# Patient Record
Sex: Male | Born: 1935 | Race: White | Hispanic: No | Marital: Married | Smoking: Former smoker
Health system: Southern US, Community
[De-identification: ages and names within clinical notes are randomized; demographics above are authoritative.]

## PROBLEM LIST (undated history)

## (undated) DIAGNOSIS — E785 Hyperlipidemia, unspecified: Secondary | ICD-10-CM

## (undated) DIAGNOSIS — K219 Gastro-esophageal reflux disease without esophagitis: Secondary | ICD-10-CM

## (undated) DIAGNOSIS — I639 Cerebral infarction, unspecified: Secondary | ICD-10-CM

## (undated) DIAGNOSIS — C9210 Chronic myeloid leukemia, BCR/ABL-positive, not having achieved remission: Secondary | ICD-10-CM

## (undated) DIAGNOSIS — M1990 Unspecified osteoarthritis, unspecified site: Secondary | ICD-10-CM

## (undated) HISTORY — DX: Hyperlipidemia, unspecified: E78.5

## (undated) HISTORY — PX: OTHER SURGICAL HISTORY: SHX169

## (undated) HISTORY — DX: Unspecified osteoarthritis, unspecified site: M19.90

## (undated) HISTORY — PX: CAROTID ENDARTERECTOMY: SUR193

## (undated) HISTORY — DX: Cerebral infarction, unspecified: I63.9

## (undated) HISTORY — PX: REPLACEMENT TOTAL KNEE BILATERAL: SUR1225

## (undated) HISTORY — DX: Gastro-esophageal reflux disease without esophagitis: K21.9

---

## 2004-06-20 ENCOUNTER — Other Ambulatory Visit: Payer: Self-pay

## 2004-07-02 ENCOUNTER — Inpatient Hospital Stay: Payer: Self-pay | Admitting: General Practice

## 2005-12-23 ENCOUNTER — Ambulatory Visit: Payer: Self-pay

## 2006-01-08 ENCOUNTER — Ambulatory Visit: Payer: Self-pay | Admitting: Gastroenterology

## 2007-04-21 ENCOUNTER — Ambulatory Visit: Payer: Self-pay | Admitting: Physician Assistant

## 2007-05-25 ENCOUNTER — Ambulatory Visit: Payer: Self-pay | Admitting: Urology

## 2007-10-27 ENCOUNTER — Ambulatory Visit: Payer: Self-pay | Admitting: Internal Medicine

## 2007-11-01 ENCOUNTER — Inpatient Hospital Stay: Payer: Self-pay | Admitting: Internal Medicine

## 2007-11-01 ENCOUNTER — Other Ambulatory Visit: Payer: Self-pay

## 2008-07-24 ENCOUNTER — Emergency Department: Payer: Self-pay | Admitting: Emergency Medicine

## 2008-07-25 ENCOUNTER — Inpatient Hospital Stay: Payer: Self-pay | Admitting: Internal Medicine

## 2008-11-03 ENCOUNTER — Emergency Department: Payer: Self-pay | Admitting: Emergency Medicine

## 2008-11-03 ENCOUNTER — Ambulatory Visit: Payer: Self-pay | Admitting: Internal Medicine

## 2008-11-16 ENCOUNTER — Emergency Department: Payer: Self-pay | Admitting: Emergency Medicine

## 2008-11-25 ENCOUNTER — Ambulatory Visit: Payer: Self-pay | Admitting: Internal Medicine

## 2008-12-04 ENCOUNTER — Ambulatory Visit: Payer: Self-pay | Admitting: Internal Medicine

## 2008-12-05 ENCOUNTER — Ambulatory Visit: Payer: Self-pay | Admitting: Internal Medicine

## 2009-01-04 ENCOUNTER — Ambulatory Visit: Payer: Self-pay | Admitting: Internal Medicine

## 2009-02-09 LAB — PULMONARY FUNCTION TEST

## 2009-03-06 ENCOUNTER — Ambulatory Visit: Payer: Self-pay | Admitting: Internal Medicine

## 2009-03-08 ENCOUNTER — Ambulatory Visit: Payer: Self-pay | Admitting: Internal Medicine

## 2009-04-05 ENCOUNTER — Ambulatory Visit: Payer: Self-pay | Admitting: Internal Medicine

## 2009-06-23 LAB — PULMONARY FUNCTION TEST

## 2009-07-11 ENCOUNTER — Ambulatory Visit: Payer: Self-pay | Admitting: Internal Medicine

## 2009-07-12 ENCOUNTER — Ambulatory Visit: Payer: Self-pay | Admitting: Internal Medicine

## 2009-08-04 ENCOUNTER — Ambulatory Visit: Payer: Self-pay | Admitting: Internal Medicine

## 2010-03-16 ENCOUNTER — Inpatient Hospital Stay: Payer: Self-pay | Admitting: Internal Medicine

## 2010-03-26 ENCOUNTER — Ambulatory Visit: Payer: Self-pay | Admitting: Internal Medicine

## 2010-04-05 ENCOUNTER — Ambulatory Visit: Payer: Self-pay | Admitting: Internal Medicine

## 2010-04-07 ENCOUNTER — Emergency Department: Payer: Self-pay | Admitting: Emergency Medicine

## 2010-04-08 ENCOUNTER — Ambulatory Visit: Payer: Self-pay | Admitting: Internal Medicine

## 2010-07-25 ENCOUNTER — Ambulatory Visit: Payer: Self-pay | Admitting: Internal Medicine

## 2010-08-05 ENCOUNTER — Ambulatory Visit: Payer: Self-pay | Admitting: Internal Medicine

## 2010-12-11 ENCOUNTER — Ambulatory Visit: Payer: Self-pay | Admitting: Internal Medicine

## 2011-01-05 ENCOUNTER — Ambulatory Visit: Payer: Self-pay | Admitting: Internal Medicine

## 2011-02-04 ENCOUNTER — Ambulatory Visit: Payer: Self-pay | Admitting: Internal Medicine

## 2011-03-27 ENCOUNTER — Ambulatory Visit: Payer: Self-pay | Admitting: Internal Medicine

## 2011-04-06 ENCOUNTER — Ambulatory Visit: Payer: Self-pay | Admitting: Internal Medicine

## 2012-03-27 ENCOUNTER — Ambulatory Visit: Payer: Self-pay | Admitting: Internal Medicine

## 2012-05-26 ENCOUNTER — Ambulatory Visit: Payer: Self-pay | Admitting: Internal Medicine

## 2013-05-19 ENCOUNTER — Ambulatory Visit: Payer: Self-pay | Admitting: Internal Medicine

## 2013-06-01 ENCOUNTER — Ambulatory Visit: Payer: Self-pay | Admitting: Internal Medicine

## 2013-06-01 LAB — CBC CANCER CENTER
BANDS NEUTROPHIL: 2 %
COMMENT - H1-COM1: NORMAL
EOS PCT: 2 %
HCT: 32.1 % — AB (ref 40.0–52.0)
HGB: 10.4 g/dL — AB (ref 13.0–18.0)
LYMPHS PCT: 12 %
MCH: 31.4 pg (ref 26.0–34.0)
MCHC: 32.3 g/dL (ref 32.0–36.0)
MCV: 97 fL (ref 80–100)
Metamyelocyte: 2 %
Monocytes: 32 %
Myelocyte: 1 %
Platelet: 161 x10 3/mm (ref 150–440)
RBC: 3.31 10*6/uL — ABNORMAL LOW (ref 4.40–5.90)
RDW: 13.3 % (ref 11.5–14.5)
Segmented Neutrophils: 47 %
VARIANT LYMPHOCYTE - H4-RLYMPH: 2 %
WBC: 12.6 x10 3/mm — ABNORMAL HIGH (ref 3.8–10.6)

## 2013-06-01 LAB — FOLATE: FOLIC ACID: 40.3 ng/mL (ref 3.1–100.0)

## 2013-06-01 LAB — LACTATE DEHYDROGENASE: LDH: 190 U/L (ref 85–241)

## 2013-06-01 LAB — RETICULOCYTES
Absolute Retic Count: 0.0675 10*6/uL (ref 0.019–0.186)
RETICULOCYTE: 2.04 % (ref 0.4–3.1)

## 2013-06-01 LAB — FERRITIN: FERRITIN (ARMC): 78 ng/mL (ref 8–388)

## 2013-06-02 LAB — IRON AND TIBC
IRON SATURATION: 21 %
IRON: 61 ug/dL — AB (ref 65–175)
Iron Bind.Cap.(Total): 286 ug/dL (ref 250–450)
UNBOUND IRON-BIND. CAP.: 225 ug/dL

## 2013-06-02 LAB — PROT IMMUNOELECTROPHORES(ARMC)

## 2013-06-06 ENCOUNTER — Ambulatory Visit: Payer: Self-pay | Admitting: Internal Medicine

## 2013-11-07 ENCOUNTER — Other Ambulatory Visit: Payer: Self-pay | Admitting: Ophthalmology

## 2013-11-07 LAB — SEDIMENTATION RATE: Erythrocyte Sed Rate: 43 mm/hr — ABNORMAL HIGH (ref 0–20)

## 2013-11-12 ENCOUNTER — Ambulatory Visit: Payer: Self-pay | Admitting: Surgery

## 2013-11-12 LAB — CBC WITH DIFFERENTIAL/PLATELET
BASOS PCT: 0.3 %
Basophil #: 0.1 10*3/uL (ref 0.0–0.1)
Eosinophil #: 0 10*3/uL (ref 0.0–0.7)
Eosinophil %: 0 %
HCT: 30.2 % — ABNORMAL LOW (ref 40.0–52.0)
HGB: 10 g/dL — ABNORMAL LOW (ref 13.0–18.0)
LYMPHS PCT: 8.7 %
Lymphocyte #: 2 10*3/uL (ref 1.0–3.6)
MCH: 32.8 pg (ref 26.0–34.0)
MCHC: 33.1 g/dL (ref 32.0–36.0)
MCV: 99 fL (ref 80–100)
MONO ABS: 8 x10 3/mm — AB (ref 0.2–1.0)
MONOS PCT: 34.8 %
NEUTROS PCT: 56.2 %
Neutrophil #: 12.9 10*3/uL — ABNORMAL HIGH (ref 1.4–6.5)
PLATELETS: 147 10*3/uL — AB (ref 150–440)
RBC: 3.05 10*6/uL — AB (ref 4.40–5.90)
RDW: 13.8 % (ref 11.5–14.5)
WBC: 23 10*3/uL — ABNORMAL HIGH (ref 3.8–10.6)

## 2013-11-12 LAB — BASIC METABOLIC PANEL
ANION GAP: 6 — AB (ref 7–16)
BUN: 45 mg/dL — AB (ref 7–18)
Calcium, Total: 8.7 mg/dL (ref 8.5–10.1)
Chloride: 107 mmol/L (ref 98–107)
Co2: 28 mmol/L (ref 21–32)
Creatinine: 2.09 mg/dL — ABNORMAL HIGH (ref 0.60–1.30)
GFR CALC AF AMER: 31 — AB
GFR CALC NON AF AMER: 27 — AB
Glucose: 98 mg/dL (ref 65–99)
Osmolality: 293 (ref 275–301)
Potassium: 4.2 mmol/L (ref 3.5–5.1)
Sodium: 141 mmol/L (ref 136–145)

## 2013-11-15 LAB — PATHOLOGY REPORT

## 2013-11-29 ENCOUNTER — Ambulatory Visit: Payer: Self-pay | Admitting: Internal Medicine

## 2013-12-23 ENCOUNTER — Ambulatory Visit: Payer: Self-pay | Admitting: Otolaryngology

## 2014-04-08 ENCOUNTER — Ambulatory Visit: Payer: Self-pay | Admitting: Otolaryngology

## 2014-05-11 ENCOUNTER — Ambulatory Visit (INDEPENDENT_AMBULATORY_CARE_PROVIDER_SITE_OTHER): Payer: Medicare Other | Admitting: Pulmonary Disease

## 2014-05-11 ENCOUNTER — Encounter: Payer: Self-pay | Admitting: Pulmonary Disease

## 2014-05-11 VITALS — BP 134/72 | HR 101 | Ht 67.0 in | Wt 189.0 lb

## 2014-05-11 DIAGNOSIS — R059 Cough, unspecified: Secondary | ICD-10-CM

## 2014-05-11 DIAGNOSIS — M316 Other giant cell arteritis: Secondary | ICD-10-CM | POA: Insufficient documentation

## 2014-05-11 DIAGNOSIS — R05 Cough: Secondary | ICD-10-CM

## 2014-05-11 DIAGNOSIS — R0602 Shortness of breath: Secondary | ICD-10-CM

## 2014-05-11 NOTE — Assessment & Plan Note (Signed)
This appears to be primarily related to his sinus congestion. However, considering the possibility of COPD as described above we will start Symbicort to see if this helps.

## 2014-05-11 NOTE — Patient Instructions (Signed)
We will request records from Dr. Laurelyn Sickle office for the pulmonary function test and will request the echocardiogram results  Take the Symbicort 2 puffs twice a day with the spacer no matter how you feel  We will see you back in 4 weeks or sooner if needed

## 2014-05-11 NOTE — Assessment & Plan Note (Signed)
Continue prednisone as prescribed by Dr. Jefm Bryant.

## 2014-05-11 NOTE — Progress Notes (Signed)
Subjective:    Patient ID: Lucas Newman, male    DOB: Sep 10, 1919, 81 y.o.   MRN: [id number]  HPI  Mr. Mathews was sent to see me by Dr. Pryor Ochoa for evaluation of several months of a cough.  He says that he brings up phlegm which is white in color and he has hoarseness.  He says that this has been a persistent problem for years, but it has been much worse in the last several months.  He initially attributed it his sinus congestion.  However lately the cough has been worse in the evenings when he lies flat.  Dr. Pryor Ochoa has performed a laryngoscopy which he says was normal.    He has never been told he had a lung problem, he had a normal childhood without asthma.  He started smoking cigarettes when he was 88 and quit at age 63.  Later he smoked for about 3-4 years and quit again about 30 years ago.  He smoked 2 packs per day.  He says that all of his siblings died as adults with smoking related problems.  He has not had repeated episodes of bronchitis.  He does not recall if he has had pulmonary function testing.   He has been having more trouble breathing which he attributes to the congestion from his sinuses.  He thinks that the swelling and muscle weakness he has experienced as a side effect of the prednisone has been contributing to his shortness of breath.    He worked as a Freight forwarder in [employer].    He has been taking prednisone recently because he was told that he had a an autoimmune condition causing problems.  It sounds like they worry that he has temporal arteritis.  He has been taking prednisone now for about 5-6 months he thinks.  He has been followed by Dr. Cristi Loron for this. He goes for blood tests with Dr. Jefm Bryant for this.    Past Medical History  Diagnosis Date  . Hyperlipidemia   . GERD (gastroesophageal reflux disease)   . Arthritis   . CVA (cerebral infarction)      Family History  Problem Relation Age of Onset  . Lung cancer Brother       History   Social History  . Marital Status: Married    Spouse Name: N/A    Number of Children: N/A  . Years of Education: N/A   Occupational History  . Not on file.   Social History Main Topics  . Smoking status: Former Smoker -- 2.00 packs/day for 20 years    Types: Cigarettes    Quit date: 05/11/1984  . Smokeless tobacco: Never Used  . Alcohol Use: 1.8 oz/week    3 Not specified per week     Comment: occasional beer/bloody mary  . Drug Use: No  . Sexual Activity: Not on file   Other Topics Concern  . Not on file   Social History Narrative  . No narrative on file     No Known Allergies   No outpatient prescriptions prior to visit.   No facility-administered medications prior to visit.      Review of Systems  Constitutional: Positive for fatigue. Negative for fever and unexpected weight change.  HENT: Positive for congestion and postnasal drip. Negative for dental problem, ear pain, nosebleeds, rhinorrhea, sinus pressure, sneezing, sore throat and trouble swallowing.   Eyes: Negative for redness and itching.  Respiratory: Positive for cough and shortness of breath. Negative  for chest tightness and wheezing.   Cardiovascular: Negative for palpitations and leg swelling.  Gastrointestinal: Negative for nausea and vomiting.  Genitourinary: Negative for dysuria.  Musculoskeletal: Negative for joint swelling.  Skin: Negative for rash.  Neurological: Negative for headaches.  Hematological: Does not bruise/bleed easily.  Psychiatric/Behavioral: Negative for dysphoric mood. The patient is not nervous/anxious.        Objective:   Physical Exam Filed Vitals:   05/11/14 1034  BP: 134/72  Pulse: 101  Height: 5\' 7"  (1.702 m)  Weight: 189 lb (85.73 kg)  SpO2: 96%   Gen: elderly male, chronically ill appearing, no acute distress HEENT: NCAT, PERRL, EOMi, OP clear, neck supple without masses PULM: Crackles in bases CV: RRR, systolic heart murmur, no JVD AB: BS+,  soft, nontender, no hsm Ext: warm, pitting leg edema, no clubbing, no cyanosis Derm: bruising forearms, no skin breakdown Neuro: A&Ox4, CN II-XII intact, strength 5/5 in all 4 extremities   December 2015 chest x-ray images reviewed, no evidence of lung disease, tortuous aorta     Assessment & Plan:   Shortness of breath I explained to Mr. Vice today in clinic that anytime someone who has a lengthy smoking history comes to see me with a complaint of shortness of breath and cough that I think of COPD. However, considering his advanced age and the fact that he never really had much in the way of shortness of breath over the years makes me question this as a possibility.  Further, I have reviewed the images from his recent chest x-ray and I do not see significant evidence of lung disease.  Considering the recent diagnosis of temporal arteritis I do question whether or not there could be an autoimmune process going on here, though common things being common I suppose COPD is at the highest of the differential diagnosis.  He has some leg swelling as well as a heart murmur on exam today so I would like to see the results of a recent echocardiogram. If one has not been done recently than we will need to obtain one.  Plan:  -trial of Symbicort 2 puffs twice a day with a spacer -We will obtain records of his recent pulmonary function test as well as the most recent echocardiogram. -If the pulmonary function test showed evidence of restrictive lung disease then we may want to consider a CT scan of his chest. -Follow-up one month  Cough This appears to be primarily related to his sinus congestion. However, considering the possibility of COPD as described above we will start Symbicort to see if this helps.  Temporal arteritis Continue prednisone as prescribed by Dr. Jefm Bryant.    Updated Medication List Outpatient Encounter Prescriptions as of 05/11/2014  Medication Sig  . acetaminophen  (TYLENOL) 650 MG CR tablet Take 650 mg by mouth every 8 (eight) hours as needed for pain.  Marland Kitchen CALCIUM-MAGNESIUM-ZINC PO Take 1 tablet by mouth daily.  . calcium-vitamin D (OSCAL) 250-125 MG-UNIT per tablet Take 1 tablet by mouth daily.  Marland Kitchen dipyridamole-aspirin (AGGRENOX) 200-25 MG per 12 hr capsule Take 2 capsules by mouth daily.  Mariane Baumgarten Calcium (STOOL SOFTENER PO) Take 1 tablet by mouth daily.  . finasteride (PROSCAR) 5 MG tablet Take 5 mg by mouth daily.  . flunisolide (NASALIDE) 25 MCG/ACT (0.025%) SOLN Place 2 sprays into the nose daily.  . Magnesium 250 MG TABS Take 1 tablet by mouth daily.  . Multiple Vitamin (MULTIVITAMIN WITH MINERALS) TABS tablet Take 1 tablet by mouth  daily.  . omeprazole (PRILOSEC) 20 MG capsule Take 20 mg by mouth daily.  . predniSONE (DELTASONE) 20 MG tablet Take 30 mg by mouth daily with breakfast.  . simvastatin (ZOCOR) 20 MG tablet Take 20 mg by mouth daily.  . tamsulosin (FLOMAX) 0.4 MG CAPS capsule Take 0.4 mg by mouth daily.

## 2014-05-11 NOTE — Assessment & Plan Note (Addendum)
I explained to Lucas Newman today in clinic that anytime someone who has a lengthy smoking history comes to see me with a complaint of shortness of breath and cough that I think of COPD. However, considering his advanced age and the fact that he never really had much in the way of shortness of breath over the years makes me question this as a possibility.  Further, I have reviewed the images from his recent chest x-ray and I do not see significant evidence of lung disease.  Considering the recent diagnosis of temporal arteritis I do question whether or not there could be an autoimmune process going on here, though common things being common I suppose COPD is at the highest of the differential diagnosis.  He has some leg swelling as well as a heart murmur on exam today so I would like to see the results of a recent echocardiogram. If one has not been done recently than we will need to obtain one.  Plan:  -trial of Symbicort 2 puffs twice a day with a spacer -We will obtain records of his recent pulmonary function test as well as the most recent echocardiogram. -If the pulmonary function test showed evidence of restrictive lung disease then we may want to consider a CT scan of his chest. -Follow-up one month

## 2014-05-26 ENCOUNTER — Telehealth: Payer: Self-pay

## 2014-05-26 NOTE — Telephone Encounter (Signed)
Pt scheduled for pft at 11 on 2/3 before appt with BQ at 12:00 that day.  Nothing further needed.

## 2014-05-26 NOTE — Telephone Encounter (Signed)
-----   Message from Juanito Doom, MD sent at 05/25/2014 10:22 PM EST ----- A, Please tell her that her PFTs from her PCP were not acceptable by ATS criteria, she needs a new order for PFT : reason dyspnea  Thanks B

## 2014-06-08 ENCOUNTER — Other Ambulatory Visit: Payer: Self-pay | Admitting: Pulmonary Disease

## 2014-06-08 ENCOUNTER — Ambulatory Visit (INDEPENDENT_AMBULATORY_CARE_PROVIDER_SITE_OTHER): Payer: Medicare Other | Admitting: Pulmonary Disease

## 2014-06-08 ENCOUNTER — Encounter: Payer: Self-pay | Admitting: Pulmonary Disease

## 2014-06-08 VITALS — BP 124/76 | HR 107 | Ht 67.0 in | Wt 190.0 lb

## 2014-06-08 DIAGNOSIS — R0602 Shortness of breath: Secondary | ICD-10-CM

## 2014-06-08 DIAGNOSIS — R05 Cough: Secondary | ICD-10-CM

## 2014-06-08 DIAGNOSIS — R059 Cough, unspecified: Secondary | ICD-10-CM

## 2014-06-08 NOTE — Progress Notes (Signed)
Subjective:    Patient ID: Lucas Newman, male    DOB: [birth year], 81 y.o.   MRN: [id number]  Synopsis: Referred in 2016 for evaluation of shortness of breath. He had a distant heavy smoking history but lung exam, simple spirometry, chest x-ray, and oximetry were normal. Of note, upon the time of his referral in 2016 he was being treated for temporal arteritis with prednisone.  HPI Chief Complaint  Patient presents with  . Follow-up    review pft.  Pt c/o sob with exertion, improving prod cough worse at night, pnd.    Mr. Lucas Newman returns to clinic today for further evaluation of his shortness of breath. He says that this has not changed since his last visit. He tried taking the Symbicort twice a day but he says that it did not help. He has not had much problems with cough since the last visit. He says his biggest problem is that he just feels generally weak. He continues to take prednisone daily though he is not certain when this medication will stop. He is currently taking 12.5 mg daily.  Past Medical History  Diagnosis Date  . Hyperlipidemia   . GERD (gastroesophageal reflux disease)   . Arthritis   . CVA (cerebral infarction)       Review of Systems     Objective:   Physical Exam Filed Vitals:   06/08/14 1150  BP: 124/76  Pulse: 107  Height: 5\' 7"  (1.702 m)  Weight: 190 lb (86.183 kg)  SpO2: 100%   Gen chronically ill apperaing HEENT: NCAT EOMi PULM: CTA B CV: RRR, no mgr AB: BS+, soft, nontender Ext: warm, trace ankle edema Neuro: A&Ox4, maew  February 2016 simple spirometry with bronchodilator: Ratio 83%, FEV1 1.73 L (88% predicted, no change with bronchodilator), FVC 2.09 L (70% predicted)       Assessment & Plan:   Shortness of breath Today Mr. Lucas Newman Stage I was unable to perform full pulmonary function testing but we did obtain an adequate spirometry test which surprisingly showed no evidence of COPD. The decreased FVC was suggestive of but not  diagnostic of restrictive lung disease. In his case I would suspect that his body habitus contributes to this. Considering his normal lung exam, normal chest x-ray, and normal oximetry I think the likelihood of an interstitial lung disease is low.  I explained to him that I believe his dyspnea is related to general fatigue and muscle weakness related to his ongoing steroid use.   Plan: If he does not have improvement in dyspnea after stopping the steroids then I asked him to come back and see [place] and we can perform further testing such as a CT scan or reattempt full pulmonary function testing.    Updated Medication List Outpatient Encounter Prescriptions as of 06/08/2014  Medication Sig  . acetaminophen (TYLENOL) 650 MG CR tablet Take 650 mg by mouth every 8 (eight) hours as needed for pain.  . budesonide-formoterol (SYMBICORT) 160-4.5 MCG/ACT inhaler Inhale 2 puffs into the lungs 2 (two) times daily.  Marland Kitchen CALCIUM-MAGNESIUM-ZINC PO Take 1 tablet by mouth daily.  . calcium-vitamin D (OSCAL) 250-125 MG-UNIT per tablet Take 1 tablet by mouth daily.  Marland Kitchen dipyridamole-aspirin (AGGRENOX) 200-25 MG per 12 hr capsule Take 2 capsules by mouth daily.  Mariane Baumgarten Calcium (STOOL SOFTENER PO) Take 1 tablet by mouth daily.  . finasteride (PROSCAR) 5 MG tablet Take 5 mg by mouth daily.  . flunisolide (NASALIDE) 25 MCG/ACT (0.025%) SOLN Place 2 sprays  into the nose daily.  . Magnesium 250 MG TABS Take 1 tablet by mouth daily.  . Multiple Vitamin (MULTIVITAMIN WITH MINERALS) TABS tablet Take 1 tablet by mouth daily.  Marland Kitchen omeprazole (PRILOSEC) 20 MG capsule Take 20 mg by mouth daily.  . predniSONE (DELTASONE) 20 MG tablet Take 30 mg by mouth daily with breakfast.  . simvastatin (ZOCOR) 20 MG tablet Take 20 mg by mouth daily.  . tamsulosin (FLOMAX) 0.4 MG CAPS capsule Take 0.4 mg by mouth daily.

## 2014-06-08 NOTE — Patient Instructions (Signed)
Follow Dr. Scharlene Gloss instructions regarding your steroid If you still have shortness of breath after stopping the prednisone let us know

## 2014-06-08 NOTE — Progress Notes (Signed)
Pre & Post performed today per BQ.

## 2014-06-08 NOTE — Assessment & Plan Note (Signed)
Today Mr. Lucas Newman I was unable to perform full pulmonary function testing but we did obtain an adequate spirometry test which surprisingly showed no evidence of COPD. The decreased FVC was suggestive of but not diagnostic of restrictive lung disease. In his case I would suspect that his body habitus contributes to this. Considering his normal lung exam, normal chest x-ray, and normal oximetry I think the likelihood of an interstitial lung disease is low.  I explained to him that I believe his dyspnea is related to general fatigue and muscle weakness related to his ongoing steroid use.   Plan: If he does not have improvement in dyspnea after stopping the steroids then I asked him to come back and see [place] and we can perform further testing such as a CT scan or reattempt full pulmonary function testing.

## 2014-06-09 ENCOUNTER — Ambulatory Visit: Payer: PRIVATE HEALTH INSURANCE | Admitting: Pulmonary Disease

## 2014-06-17 LAB — PULMONARY FUNCTION TEST
FEF 25-75 Post: 1.89 L/sec
FEF 25-75 Pre: 2.31 L/sec
FEF2575-%Change-Post: -17 %
FEF2575-%Pred-Post: 173 %
FEF2575-%Pred-Pre: 211 %
FEV1-%Change-Post: -5 %
FEV1-%Pred-Post: 88 %
FEV1-%Pred-Pre: 93 %
FEV1-Post: 1.73 L
FEV1-Pre: 1.84 L
FEV1FVC-%Change-Post: -2 %
FEV1FVC-%Pred-Pre: 123 %
FEV6-%Change-Post: -2 %
FEV6-%Pred-Post: 77 %
FEV6-%Pred-Pre: 79 %
FEV6-Post: 2.09 L
FEV6-Pre: 2.16 L
FEV6FVC-%Pred-Post: 109 %
FEV6FVC-%Pred-Pre: 109 %
FVC-%Change-Post: -2 %
FVC-%Pred-Post: 70 %
FVC-%Pred-Pre: 72 %
FVC-Post: 2.09 L
FVC-Pre: 2.16 L
Post FEV1/FVC ratio: 83 %
Post FEV6/FVC ratio: 100 %
Pre FEV1/FVC ratio: 85 %
Pre FEV6/FVC Ratio: 100 %

## 2014-06-28 ENCOUNTER — Ambulatory Visit: Payer: Self-pay | Admitting: Internal Medicine

## 2014-07-05 ENCOUNTER — Ambulatory Visit
Admit: 2014-07-05 | Disposition: A | Discharge status: Home / Self Care | Payer: Self-pay | Attending: Internal Medicine | Admitting: Internal Medicine

## 2014-08-05 ENCOUNTER — Ambulatory Visit
Admit: 2014-08-05 | Disposition: A | Discharge status: Home / Self Care | Payer: Self-pay | Attending: Internal Medicine | Admitting: Internal Medicine

## 2014-08-27 NOTE — Op Note (Signed)
PATIENT NAME:  [name], [name] MR#:  [id number] DATE OF BIRTH:  May 03, 1920  DATE OF PROCEDURE:  11/12/2013  PREOPERATIVE DIAGNOSIS: Left thigh pain, concern for temporal arteritis.  POSTOPERATIVE DIAGNOSIS:  Left thigh pain, concern for temporal arteritis.  PROCEDURE PERFORMED:  Left temporal artery biopsy.   ANESTHESIA: MAC local.   ESTIMATED BLOOD LOSS: 5 mL.   COMPLICATIONS: None.  SPECIMEN: Temporal artery specimen.    INDICATION FOR PROCEDURE:  Mr. Lucas Newman is a 81 year old male who presents with acute onset of left eye pain.  He was seen by his ophthalmologist and their concern is for temporal arteritis.  He thus was brought to the operating room suite for left temporal artery biopsy.   DETAILS OF PROCEDURE: Informed consent was obtained. Mr. Riffe was brought to the operating room suite. He was given IV sedation. His left face, superior to the tragus of the ear,  was clipped and prepped with Betadine. I then used the Doppler to follow the course of his temporal artery. I then made an incision over the temporal artery and went down to the superficial tissues.  Isolated the temporal artery and found approximately 2 cm of nice tortuous temporal artery, and sharply ligated at the 2 ends that were tied.  All branches were ligated.  The specimen was then sent to pathology. The wound was then irrigated and closed in 2 layers, a deep dermal 0 Vicryl and a 4-0 Monocryl running subcuticular. Dermabond was then used to complete the dressing. The patient was then awoken and brought to the postanesthesia care unit. There were no immediate complications. Needle, sponge, and instrument counts were correct at the end of the procedure.   ____________________________ Glena Norfolk. Nitza Schmid, MD cal:ts D: 11/12/2013 10:54:03 ET T: 11/12/2013 11:31:21 ET JOB#: [id number]  cc: Harrell Gave A. Jackey Housey, MD, <Dictator> Floyde Parkins MD ELECTRONICALLY SIGNED 11/14/2013 18:15

## 2014-09-04 ENCOUNTER — Emergency Department: Payer: Medicare Other

## 2014-09-04 ENCOUNTER — Inpatient Hospital Stay
Admission: EM | Admit: 2014-09-04 | Discharge: 2014-09-07 | DRG: 871 | Disposition: A | Discharge status: Skilled Nursing Facility | Payer: Medicare Other | Attending: Internal Medicine | Admitting: Internal Medicine

## 2014-09-04 ENCOUNTER — Encounter: Payer: Self-pay | Admitting: Internal Medicine

## 2014-09-04 DIAGNOSIS — N179 Acute kidney failure, unspecified: Secondary | ICD-10-CM | POA: Diagnosis present

## 2014-09-04 DIAGNOSIS — N183 Chronic kidney disease, stage 3 (moderate): Secondary | ICD-10-CM | POA: Diagnosis present

## 2014-09-04 DIAGNOSIS — Z7952 Long term (current) use of systemic steroids: Secondary | ICD-10-CM

## 2014-09-04 DIAGNOSIS — Z833 Family history of diabetes mellitus: Secondary | ICD-10-CM

## 2014-09-04 DIAGNOSIS — M1990 Unspecified osteoarthritis, unspecified site: Secondary | ICD-10-CM | POA: Diagnosis present

## 2014-09-04 DIAGNOSIS — Z79899 Other long term (current) drug therapy: Secondary | ICD-10-CM | POA: Diagnosis not present

## 2014-09-04 DIAGNOSIS — Z87891 Personal history of nicotine dependence: Secondary | ICD-10-CM

## 2014-09-04 DIAGNOSIS — J441 Chronic obstructive pulmonary disease with (acute) exacerbation: Secondary | ICD-10-CM

## 2014-09-04 DIAGNOSIS — E785 Hyperlipidemia, unspecified: Secondary | ICD-10-CM | POA: Diagnosis present

## 2014-09-04 DIAGNOSIS — Z8249 Family history of ischemic heart disease and other diseases of the circulatory system: Secondary | ICD-10-CM

## 2014-09-04 DIAGNOSIS — J189 Pneumonia, unspecified organism: Secondary | ICD-10-CM | POA: Diagnosis present

## 2014-09-04 DIAGNOSIS — Z8673 Personal history of transient ischemic attack (TIA), and cerebral infarction without residual deficits: Secondary | ICD-10-CM | POA: Diagnosis not present

## 2014-09-04 DIAGNOSIS — K219 Gastro-esophageal reflux disease without esophagitis: Secondary | ICD-10-CM | POA: Diagnosis present

## 2014-09-04 DIAGNOSIS — A419 Sepsis, unspecified organism: Secondary | ICD-10-CM | POA: Diagnosis not present

## 2014-09-04 DIAGNOSIS — Z801 Family history of malignant neoplasm of trachea, bronchus and lung: Secondary | ICD-10-CM

## 2014-09-04 DIAGNOSIS — Z7951 Long term (current) use of inhaled steroids: Secondary | ICD-10-CM

## 2014-09-04 DIAGNOSIS — I776 Arteritis, unspecified: Secondary | ICD-10-CM | POA: Diagnosis present

## 2014-09-04 DIAGNOSIS — J181 Lobar pneumonia, unspecified organism: Secondary | ICD-10-CM

## 2014-09-04 LAB — COMPREHENSIVE METABOLIC PANEL
ALT: 12 U/L — AB (ref 17–63)
AST: 19 U/L (ref 15–41)
Albumin: 3.5 g/dL (ref 3.5–5.0)
Alkaline Phosphatase: 46 U/L (ref 38–126)
Anion gap: 9 (ref 5–15)
BUN: 31 mg/dL — ABNORMAL HIGH (ref 6–20)
CO2: 27 mmol/L (ref 22–32)
Calcium: 8.8 mg/dL — ABNORMAL LOW (ref 8.9–10.3)
Chloride: 102 mmol/L (ref 101–111)
Creatinine, Ser: 1.78 mg/dL — ABNORMAL HIGH (ref 0.61–1.24)
GFR calc Af Amer: 36 mL/min — ABNORMAL LOW (ref >60)
GFR, EST NON AFRICAN AMERICAN: 31 mL/min — AB (ref >60)
Glucose, Bld: 180 mg/dL — ABNORMAL HIGH (ref 65–99)
POTASSIUM: 4 mmol/L (ref 3.5–5.1)
SODIUM: 138 mmol/L (ref 135–145)
Total Bilirubin: 0.6 mg/dL (ref 0.3–1.2)
Total Protein: 7.3 g/dL (ref 6.5–8.1)

## 2014-09-04 LAB — CBC
HCT: 29.6 % — ABNORMAL LOW (ref 40.0–52.0)
Hemoglobin: 9.4 g/dL — ABNORMAL LOW (ref 13.0–18.0)
MCH: 31.5 pg (ref 26.0–34.0)
MCHC: 31.9 g/dL — ABNORMAL LOW (ref 32.0–36.0)
MCV: 98.9 fL (ref 80.0–100.0)
PLATELETS: 149 10*3/uL — AB (ref 150–440)
RBC: 2.99 MIL/uL — ABNORMAL LOW (ref 4.40–5.90)
RDW: 13.5 % (ref 11.5–14.5)
WBC: 31.3 10*3/uL — ABNORMAL HIGH (ref 3.8–10.6)

## 2014-09-04 LAB — BRAIN NATRIURETIC PEPTIDE: B NATRIURETIC PEPTIDE 5: 172 pg/mL — AB (ref 0.0–100.0)

## 2014-09-04 LAB — TROPONIN I: Troponin I: 0.02 ng/mL (ref <0.031)

## 2014-09-04 MED ORDER — DEXTROSE 5 % IV SOLN
INTRAVENOUS | Status: AC
  Filled 2014-09-04: qty 10

## 2014-09-04 MED ORDER — HYDROCODONE-ACETAMINOPHEN 5-325 MG PO TABS
1.0000 | ORAL_TABLET | ORAL | Status: DC | PRN
Start: 2014-09-04 — End: 2014-09-07

## 2014-09-04 MED ORDER — FINASTERIDE 5 MG PO TABS
5.0000 mg | ORAL_TABLET | Freq: Every day | ORAL | Status: DC
  Administered 2014-09-05 – 2014-09-07 (×3): 5 mg via ORAL
  Filled 2014-09-04 (×4): qty 1

## 2014-09-04 MED ORDER — ACETAMINOPHEN 325 MG PO TABS: 650.0000 mg | ORAL_TABLET | Freq: Four times a day (QID) | ORAL | Status: DC | PRN

## 2014-09-04 MED ORDER — SODIUM CHLORIDE 0.9 % IV SOLN
1000.0000 mL | Freq: Once | INTRAVENOUS | Status: AC
  Administered 2014-09-04: 1000 mL via INTRAVENOUS

## 2014-09-04 MED ORDER — ONDANSETRON HCL 4 MG PO TABS
4.0000 mg | ORAL_TABLET | Freq: Four times a day (QID) | ORAL | Status: DC | PRN
  Filled 2014-09-04: qty 1

## 2014-09-04 MED ORDER — LORATADINE 10 MG PO TABS
10.0000 mg | ORAL_TABLET | Freq: Every day | ORAL | Status: DC
  Administered 2014-09-05 – 2014-09-07 (×3): 10 mg via ORAL
  Filled 2014-09-04 (×3): qty 1

## 2014-09-04 MED ORDER — PREDNISONE 5 MG/5ML PO SOLN
7.5000 mg | Freq: Every day | ORAL | Status: DC
  Filled 2014-09-04 (×3): qty 7.5

## 2014-09-04 MED ORDER — ASPIRIN-DIPYRIDAMOLE ER 25-200 MG PO CP12
1.0000 | ORAL_CAPSULE | Freq: Two times a day (BID) | ORAL | Status: DC
  Administered 2014-09-04 – 2014-09-07 (×6): 1 via ORAL
  Filled 2014-09-04 (×7): qty 1

## 2014-09-04 MED ORDER — GUAIFENESIN ER 600 MG PO TB12
600.0000 mg | ORAL_TABLET | Freq: Two times a day (BID) | ORAL | Status: DC
  Administered 2014-09-04 – 2014-09-07 (×7): 600 mg via ORAL
  Filled 2014-09-04 (×7): qty 1

## 2014-09-04 MED ORDER — ACETAMINOPHEN ER 650 MG PO TBCR: 650.0000 mg | EXTENDED_RELEASE_TABLET | Freq: Three times a day (TID) | ORAL | Status: DC | PRN

## 2014-09-04 MED ORDER — FLUNISOLIDE 25 MCG/ACT (0.025%) NA SOLN
2.0000 | Freq: Every day | NASAL | Status: DC
  Filled 2014-09-04 (×2): qty 25

## 2014-09-04 MED ORDER — PANTOPRAZOLE SODIUM 40 MG PO TBEC
40.0000 mg | DELAYED_RELEASE_TABLET | Freq: Every day | ORAL | Status: DC
  Administered 2014-09-05 – 2014-09-07 (×3): 40 mg via ORAL
  Filled 2014-09-04 (×3): qty 1

## 2014-09-04 MED ORDER — SIMVASTATIN 20 MG PO TABS
20.0000 mg | ORAL_TABLET | Freq: Every day | ORAL | Status: DC
  Administered 2014-09-04 – 2014-09-06 (×3): 20 mg via ORAL
  Filled 2014-09-04 (×3): qty 1

## 2014-09-04 MED ORDER — BENZONATATE 100 MG PO CAPS
100.0000 mg | ORAL_CAPSULE | Freq: Three times a day (TID) | ORAL | Status: DC
  Administered 2014-09-04 – 2014-09-07 (×8): 100 mg via ORAL
  Filled 2014-09-04 (×8): qty 1

## 2014-09-04 MED ORDER — ADULT MULTIVITAMIN W/MINERALS CH
1.0000 | ORAL_TABLET | Freq: Every day | ORAL | Status: DC
  Administered 2014-09-05 – 2014-09-07 (×2): 1 via ORAL
  Filled 2014-09-04 (×3): qty 1

## 2014-09-04 MED ORDER — ACETAMINOPHEN 650 MG RE SUPP: 650.0000 mg | Freq: Four times a day (QID) | RECTAL | Status: DC | PRN

## 2014-09-04 MED ORDER — DEXTROSE 5 % IV SOLN
INTRAVENOUS | Status: AC
  Filled 2014-09-04: qty 500

## 2014-09-04 MED ORDER — DOCUSATE SODIUM 100 MG PO CAPS
100.0000 mg | ORAL_CAPSULE | Freq: Every day | ORAL | Status: DC
  Administered 2014-09-04 – 2014-09-07 (×4): 100 mg via ORAL
  Filled 2014-09-04 (×4): qty 1

## 2014-09-04 MED ORDER — AZITHROMYCIN 250 MG PO TABS
250.0000 mg | ORAL_TABLET | Freq: Every day | ORAL | Status: DC
  Administered 2014-09-05 – 2014-09-07 (×3): 250 mg via ORAL
  Filled 2014-09-04 (×3): qty 1

## 2014-09-04 MED ORDER — DEXTROSE 5 % IV SOLN
500.0000 mg | Freq: Once | INTRAVENOUS | Status: AC
  Administered 2014-09-04: 500 mg via INTRAVENOUS

## 2014-09-04 MED ORDER — DEXTROSE 5 % IV SOLN
1.0000 g | Freq: Once | INTRAVENOUS | Status: AC
  Administered 2014-09-04: 1 g via INTRAVENOUS

## 2014-09-04 MED ORDER — CALCIUM-MAGNESIUM-ZINC 333-133-5 MG PO TABS: 1.0000 | ORAL_TABLET | Freq: Every day | ORAL | Status: DC

## 2014-09-04 MED ORDER — SODIUM CHLORIDE 0.9 % IV SOLN
INTRAVENOUS | Status: DC
  Administered 2014-09-04 – 2014-09-05 (×4): via INTRAVENOUS

## 2014-09-04 MED ORDER — DEXTROSE 5 % IV SOLN
1.0000 g | INTRAVENOUS | Status: DC
  Administered 2014-09-05 – 2014-09-06 (×2): 1 g via INTRAVENOUS
  Filled 2014-09-04 (×4): qty 10

## 2014-09-04 MED ORDER — PREDNISOLONE 5 MG PO TABS
7.5000 mg | ORAL_TABLET | Freq: Every day | ORAL | Status: DC
  Filled 2014-09-04 (×2): qty 2

## 2014-09-04 MED ORDER — IPRATROPIUM-ALBUTEROL 0.5-2.5 (3) MG/3ML IN SOLN
3.0000 mL | Freq: Once | RESPIRATORY_TRACT | Status: AC
  Administered 2014-09-04: 3 mL via RESPIRATORY_TRACT

## 2014-09-04 MED ORDER — PREDNISONE 5 MG PO TABS
7.5000 mg | ORAL_TABLET | Freq: Every day | ORAL | Status: DC
  Filled 2014-09-04 (×2): qty 1

## 2014-09-04 MED ORDER — CALCIUM-VITAMIN D 250-125 MG-UNIT PO TABS: 1.0000 | ORAL_TABLET | Freq: Every day | ORAL | Status: DC

## 2014-09-04 MED ORDER — HEPARIN SODIUM (PORCINE) 5000 UNIT/ML IJ SOLN
5000.0000 [IU] | Freq: Three times a day (TID) | INTRAMUSCULAR | Status: DC
  Administered 2014-09-04 – 2014-09-07 (×9): 5000 [IU] via SUBCUTANEOUS
  Filled 2014-09-04 (×9): qty 1

## 2014-09-04 MED ORDER — ONDANSETRON HCL 4 MG/2ML IJ SOLN: 4.0000 mg | Freq: Four times a day (QID) | INTRAMUSCULAR | Status: DC | PRN

## 2014-09-04 MED ORDER — ALBUTEROL SULFATE (2.5 MG/3ML) 0.083% IN NEBU
2.5000 mg | INHALATION_SOLUTION | Freq: Four times a day (QID) | RESPIRATORY_TRACT | Status: DC | PRN
  Administered 2014-09-04 – 2014-09-05 (×2): 2.5 mg via RESPIRATORY_TRACT
  Filled 2014-09-04 (×2): qty 3

## 2014-09-04 MED ORDER — TAMSULOSIN HCL 0.4 MG PO CAPS
0.4000 mg | ORAL_CAPSULE | Freq: Every day | ORAL | Status: DC
  Administered 2014-09-05 – 2014-09-07 (×3): 0.4 mg via ORAL
  Filled 2014-09-04 (×3): qty 1

## 2014-09-04 MED ORDER — POLYETHYLENE GLYCOL 3350 17 G PO PACK
17.0000 g | PACK | Freq: Every day | ORAL | Status: DC | PRN
  Administered 2014-09-06 – 2014-09-07 (×2): 17 g via ORAL
  Filled 2014-09-04 (×2): qty 1

## 2014-09-04 MED ORDER — BISACODYL 10 MG RE SUPP: 10.0000 mg | Freq: Every day | RECTAL | Status: DC | PRN

## 2014-09-04 MED ORDER — PREDNISONE 5 MG PO TABS
7.5000 mg | ORAL_TABLET | Freq: Every day | ORAL | Status: DC
  Filled 2014-09-04: qty 1

## 2014-09-04 MED ORDER — MAGNESIUM 250 MG PO TABS: 1.0000 | ORAL_TABLET | Freq: Every day | ORAL | Status: DC

## 2014-09-04 NOTE — H&P (Signed)
Patient Demographics  Lucas Newman, is a 81 y.o. male  MRN: [id number]   DOB - June 04, 1919  Admit Date - 09/04/2014  Outpatient Primary MD for the patient is Lavera Guise, MD   With History of -  Past Medical History  Diagnosis Date  . Hyperlipidemia   . GERD (gastroesophageal reflux disease)   . Arthritis   . CVA (cerebral infarction) Temporal arteritis       Past Surgical History  Procedure Laterality Date  . Appendectomy    . Replacement total knee bilateral  2002/2004  . Carotid endarterectomy Left     in for   Chief Complaint  Patient presents with  . Cough     HPI  Lucas Newman  is a 81 y.o. male with past medical history of temporal arteritis CVA comes into the emergency room with increasing shortness of breath cough productive greenish yellowish phlegm for 3 days patient in the emergency room was found to have right lower pneumonia. He received IV rocephin and zithromax. No fever at present. Denies Cp -wbc count is 31K (on chronic steroids for Temporal arteritis)   Review of Systems    In addition to the HPI above,  No Fever-chills, No Headache, No changes with Vision or hearing, No problems swallowing food or Liquids, No Chest pain,+for  Cough and Shortness of Breath, No Abdominal pain, No Nausea or Vommitting, Bowel movements are regular No Blood in stool or Urine, No dysuria, No new skin rashes or bruises, No new joints pains-aches,  +gen weakness,no tingling or numbness in any extremity, No recent weight gain or loss, No polyuria, polydypsia or polyphagia,  A full 10 point Review of Systems was done, except as stated above, all other Review of Systems were negative.   Social History History  Substance Use Topics  . Smoking status: Former Smoker -- 2.00 packs/day  for 20 years    Types: Cigarettes    Quit date: 05/11/1984  . Smokeless tobacco: Never Used  . Alcohol Use: 1.8 oz/week    3 Standard drinks or equivalent per week     Comment: occasional beer/bloody mary     Family History Family History  Problem Relation Age of Onset  . Lung cancer Brother   . Diabetes type II Mother   . Hypertension Father      Prior to Admission medications   Medication Sig Start Date End Date Taking? Authorizing Provider  acetaminophen (TYLENOL) 650 MG CR tablet Take 650 mg by mouth every 8 (eight) hours as needed for pain.    Historical Provider, MD  budesonide-formoterol (SYMBICORT) 160-4.5 MCG/ACT inhaler Inhale 2 puffs into the lungs 2 (two) times daily.    Historical Provider, MD  CALCIUM-MAGNESIUM-ZINC PO Take 1 tablet by mouth daily.    Historical Provider, MD  calcium-vitamin D (OSCAL) 250-125 MG-UNIT per tablet Take 1 tablet by mouth daily.    Historical Provider, MD  dipyridamole-aspirin (AGGRENOX) 200-25 MG  per 12 hr capsule Take 2 capsules by mouth daily.    Historical Provider, MD  Docusate Calcium (STOOL SOFTENER PO) Take 1 tablet by mouth daily.    Historical Provider, MD  finasteride (PROSCAR) 5 MG tablet Take 5 mg by mouth daily.    Historical Provider, MD  flunisolide (NASALIDE) 25 MCG/ACT (0.025%) SOLN Place 2 sprays into the nose daily.    Historical Provider, MD  Magnesium 250 MG TABS Take 1 tablet by mouth daily.    Historical Provider, MD  Multiple Vitamin (MULTIVITAMIN WITH MINERALS) TABS tablet Take 1 tablet by mouth daily.    Historical Provider, MD  omeprazole (PRILOSEC) 20 MG capsule Take 20 mg by mouth daily.    Historical Provider, MD  predniSONE (DELTASONE) 20 MG tablet Take 30 mg by mouth daily with breakfast.    Historical Provider, MD  simvastatin (ZOCOR) 20 MG tablet Take 20 mg by mouth daily.    Historical Provider, MD  tamsulosin (FLOMAX) 0.4 MG CAPS capsule Take 0.4 mg by mouth daily.    Historical Provider, MD    No  Known Allergies  Physical Exam  Vitals  Blood pressure 122/60, pulse 70, temperature 99.2 F (37.3 C), temperature source Oral, resp. rate 26, height 5\' 7"  (1.702 m), weight 84.324 kg (185 lb 14.4 oz), SpO2 100 %.   1. General lying in bed in NAD 2. Normal affect and insight, Not Suicidal or Homicidal, Awake Alert, Oriented X 3.  3. No Focal Neuro deficits, ALL Cr.Nerves Intact, Strength 5/5 all 4 extremities, Sensation intact all 4 extremities, Plantars down going.  4. Ears and Eyes appear Normal, Conjunctivae clear, PERRLA. Moist Oral Mucosa.  5. Supple Neck, No JVD, No cervical lymphadenopathy appriciated, No Carotid Bruits.  6. Symmetrical Chest wall movement, Good air movement bilaterally. coarse breath sounds   7. Mild tachcardia, No Gallops, Rubs or Murmurs, No Parasternal Heave.  8. Positive Bowel Sounds, Abdomen Soft, No tenderness, No organomegaly appriciated,No rebound guarding or rigidity.  9.  No Cyanosis, Normal Skin Turgor, No Skin Rash or Bruise.  10. Good muscle tone,  joints appear normal , no effusions, Normal ROM.  11. No Palpable Lymph Nodes in Neck or Axillae   Data Review  CBC  Recent Labs Lab 09/04/14 1200  WBC 31.3*  HGB 9.4*  HCT 29.6*  PLT 149*  MCV 98.9  MCH 31.5  MCHC 31.9*  RDW 13.5   ------------------------------------------------------------------------------------------------------------------  Chemistries   Recent Labs Lab 09/04/14 1200  NA 138  K 4.0  CL 102  CO2 27  GLUCOSE 180*  BUN 31*  CREATININE 1.78*  CALCIUM 8.8*  AST 19  ALT 12*  ALKPHOS 46  BILITOT 0.6   ------------------------------------------------------------------------------------------------------------------ estimated creatinine clearance is 27.5 mL/min (by C-G formula based on Cr of 1.78). ------------------------------------------------------------------------------------------------------------------ No results for input(s): TSH, T4TOTAL,  T3FREE, THYROIDAB in the last 72 hours.  Invalid input(s): FREET3   Coagulation profile No results for input(s): INR, PROTIME in the last 168 hours. ------------------------------------------------------------------------------------------------------------------- No results for input(s): DDIMER in the last 72 hours. -------------------------------------------------------------------------------------------------------------------  Cardiac Enzymes  Recent Labs Lab 09/04/14 1200  TROPONINI 0.02   ------------------------------------------------------------------------------------------------------------------ Invalid input(s): POCBNP   ---------------------------------------------------------------------------------------------------------------  Urinalysis No results found for: COLORURINE, APPEARANCEUR, LABSPEC, PHURINE, GLUCOSEU, HGBUR, BILIRUBINUR, KETONESUR, PROTEINUR, UROBILINOGEN, NITRITE, LEUKOCYTESUR  ----------------------------------------------------------------------------------------------------------------  Imaging results:   Dg Chest 2 View  09/04/2014   CLINICAL DATA:  Cough  EXAM: CHEST  2 VIEW  COMPARISON:  04/08/2014  FINDINGS: Mild left lower lobe opacity,  atelectasis versus pneumonia. Mild right basilar opacity, likely atelectasis. No pleural effusion or pneumothorax.  The heart is top-normal in size.  Degenerative changes of the visualized thoracolumbar spine.  IMPRESSION: Mild left lower lobe opacity, atelectasis versus pneumonia.   Electronically Signed   By: Julian Hy M.D.   On: 09/04/2014 12:45    My personal review of EKG: S Tachycardia with 1st degree AV block,RBBB   Assessment & Plan  1. Sepsis due to  Community acquired pneumonia left LLL -admit to medical floor -IV rocephin and zithromax -f/u BC and sputum cx -f/u wbc count  2. Cough -prn tessalon perrles  3.Leucocytosis -due to pneumonia and some reactive due to chronic steroids  (for temporal arteritis)  4.Acute on CKD-III Baseline creat 1.5 -give some IVF  5.h/o CVA  6.DVT Prophylaxis Heparin  SQ    Family Communication: Admission, patients condition and plan of care including tests being ordered have been discussed with the patient and grandson who indicate understanding and agree with the plan and Code Status.  Code Status full   Time spent in minutes : 46 mins    Princella Jaskiewicz M.D on 09/04/2014 at 2:48 PM

## 2014-09-04 NOTE — Plan of Care (Signed)
Problem: Discharge Progression Outcomes Goal: Activity appropriate for discharge plan Outcome: Not Progressing New admission for pneumonia, non productive cough, short of breath, vitals stable o2 stable on room air 96%, telemetry a fib, NS running at 29ml/hr, patient not having any pain at this time

## 2014-09-04 NOTE — ED Provider Notes (Signed)
[facility] Emergency Department Provider Note    ____________________________________________  Time seen: 11:30 AM  I have reviewed the triage vital signs and the nursing notes.   HISTORY  Chief Complaint Cough       HPI Lucas Newman is a 81 y.o. male who presents with cough which is been present for several months. Apparently it has gotten worse the last 2 days. He has seen multiple physicians for this cough. But he has recently developed shortness of breath. He reports the cough is productive and as I controlled his chest when he does cough. No recent travel, no calf pain or swelling. No fever or chills.      Past Medical History  Diagnosis Date  . Hyperlipidemia   . GERD (gastroesophageal reflux disease)   . Arthritis   . CVA (cerebral infarction)     Patient Active Problem List   Diagnosis Date Noted  . Cough 05/11/2014  . Shortness of breath 05/11/2014  . Temporal arteritis 05/11/2014    Past Surgical History  Procedure Laterality Date  . Appendectomy    . Replacement total knee bilateral  2002/2004    Current Outpatient Rx  Name  Route  Sig  Dispense  Refill  . acetaminophen (TYLENOL) 650 MG CR tablet   Oral   Take 650 mg by mouth every 8 (eight) hours as needed for pain.         . budesonide-formoterol (SYMBICORT) 160-4.5 MCG/ACT inhaler   Inhalation   Inhale 2 puffs into the lungs 2 (two) times daily.         Marland Kitchen CALCIUM-MAGNESIUM-ZINC PO   Oral   Take 1 tablet by mouth daily.         . calcium-vitamin D (OSCAL) 250-125 MG-UNIT per tablet   Oral   Take 1 tablet by mouth daily.         Marland Kitchen dipyridamole-aspirin (AGGRENOX) 200-25 MG per 12 hr capsule   Oral   Take 2 capsules by mouth daily.         Mariane Baumgarten Calcium (STOOL SOFTENER PO)   Oral   Take 1 tablet by mouth daily.         . finasteride (PROSCAR) 5 MG tablet   Oral   Take 5 mg by mouth daily.         . flunisolide (NASALIDE) 25 MCG/ACT  (0.025%) SOLN   Nasal   Place 2 sprays into the nose daily.         . Magnesium 250 MG TABS   Oral   Take 1 tablet by mouth daily.         . Multiple Vitamin (MULTIVITAMIN WITH MINERALS) TABS tablet   Oral   Take 1 tablet by mouth daily.         Marland Kitchen omeprazole (PRILOSEC) 20 MG capsule   Oral   Take 20 mg by mouth daily.         . predniSONE (DELTASONE) 20 MG tablet   Oral   Take 30 mg by mouth daily with breakfast.         . simvastatin (ZOCOR) 20 MG tablet   Oral   Take 20 mg by mouth daily.         . tamsulosin (FLOMAX) 0.4 MG CAPS capsule   Oral   Take 0.4 mg by mouth daily.           Allergies Review of patient's allergies indicates no known allergies.  Family History  Problem Relation Age of Onset  . Lung cancer Brother     Social History History  Substance Use Topics  . Smoking status: Former Smoker -- 2.00 packs/day for 20 years    Types: Cigarettes    Quit date: 05/11/1984  . Smokeless tobacco: Never Used  . Alcohol Use: 1.8 oz/week    3 Standard drinks or equivalent per week     Comment: occasional beer/bloody mary    Review of Systems  Constitutional: Negative for fever. Eyes: Negative for visual changes. ENT: Negative for sore throat. Cardiovascular: Chest discomfort when coughing Respiratory: Positive for shortness of breath, productive cough Gastrointestinal: Negative for abdominal pain, vomiting and diarrhea. Genitourinary: Negative for dysuria. Musculoskeletal: Negative for back pain. Skin: Negative for rash. Neurological: Negative for headaches, focal weakness or numbness. Psychiatric: No anxiety or depression   10-point ROS otherwise negative.  ____________________________________________   PHYSICAL EXAM:  VITAL SIGNS: ED Triage Vitals  Enc Vitals Group     BP 09/04/14 1132 135/70 mmHg     Pulse Rate 09/04/14 1132 111     Resp --      Temp 09/04/14 1132 99.2 F (37.3 C)     Temp Source 09/04/14 1132 Oral      SpO2 09/04/14 1132 97 %     Weight 09/04/14 1132 185 lb 14.4 oz (84.324 kg)     Height 09/04/14 1132 5\' 7"  (1.702 m)     Head Cir --      Peak Flow --      Pain Score 09/04/14 1134 3     Pain Loc --      Pain Edu? --      Excl. in [place]? --      Constitutional: Alert and oriented. Well appearing and in no distress. Very well-appearing 81 year old male Eyes: Conjunctivae are normal. PERRL. Normal extraocular movements. ENT   Head: Normocephalic and atraumatic.   Nose: No congestion/rhinnorhea.   Mouth/Throat: Mucous membranes are moist.   Neck: No stridor. Hematological/Lymphatic/Immunilogical: No cervical lymphadenopathy. Cardiovascular: Normal rate, regular rhythm. Normal and symmetric distal pulses are present in all extremities. No murmurs, rubs, or gallops. Respiratory: Positive Rales bibasilarly, mild wheezing noted on right lower lung, no tachypnea, no accessory muscle use Gastrointestinal: Soft and nontender. No distention. No abdominal bruits. There is no CVA tenderness. Genitourinary: Deferred Musculoskeletal: Nontender with normal range of motion in all extremities. No joint effusions.   Right lower leg:  No tenderness or edema.   Left lower leg:  No tenderness or edema. Neurologic:  Normal speech and language. No gross focal neurologic deficits are appreciated. Speech is normal. No gait instability. Skin:  Skin is warm, dry and intact. No rash noted. Psychiatric: Mood and affect are normal. Speech and behavior are normal. Patient exhibits appropriate insight and judgment.  ____________________________________________   EKG  ED ECG REPORT   Date: 09/04/2014  EKG Time: 12:27 PM  Rate: 115  Rhythm: nonspecific ST and T waves changes, sinus tachycardia, RBBB, occasional PVC noted, unifocal  Axis: Normal axis  Intervals: Normal interval  ST&T Change: Nonspecific   ____________________________________________    RADIOLOGY  Chest x-ray  concerning for pneumonia  ____________________________________________   PROCEDURES  Procedure(s) performed: None  Critical Care performed: No  ____________________________________________   INITIAL IMPRESSION / ASSESSMENT AND PLAN / ED COURSE  Pertinent labs & imaging results that were available during my care of the patient were reviewed by me and considered in my medical decision making (see chart for details).  Given mild hypoxia 91-92% on room air and continued tachycardia with chest x-ray concerning for pneumonia feel the patient needs admission. Blood cultures 2 drawn we'll start Rocephin IV and azithromycin IV. No saline 1 L  ____________________________________________   FINAL CLINICAL IMPRESSION(S) / ED DIAGNOSES  Final diagnoses:  Community acquired pneumonia     Lavonia Drafts, MD 09/04/14 1410

## 2014-09-04 NOTE — ED Notes (Signed)
Patient arrival via [facility] with c/o "chest congestion and cough". Presents with non-productive cough. Patient states that is taking OTC mucinex that relieves his congestion but increases his cough. Symptoms have been present for 4-5 months, worsening since patient began taking mucinex 2-4 days ago. Denies CP, ShOB. Patient seen by Humphrey Rolls MD.

## 2014-09-05 LAB — BASIC METABOLIC PANEL
Anion gap: 9 (ref 5–15)
BUN: 29 mg/dL — ABNORMAL HIGH (ref 6–20)
CO2: 24 mmol/L (ref 22–32)
Calcium: 8 mg/dL — ABNORMAL LOW (ref 8.9–10.3)
Chloride: 106 mmol/L (ref 101–111)
Creatinine, Ser: 1.72 mg/dL — ABNORMAL HIGH (ref 0.61–1.24)
GFR calc Af Amer: 38 mL/min — ABNORMAL LOW (ref >60)
GFR calc non Af Amer: 33 mL/min — ABNORMAL LOW (ref >60)
Glucose, Bld: 151 mg/dL — ABNORMAL HIGH (ref 65–99)
Potassium: 3.9 mmol/L (ref 3.5–5.1)
Sodium: 139 mmol/L (ref 135–145)

## 2014-09-05 LAB — CBC
HCT: 26.9 % — ABNORMAL LOW (ref 40.0–52.0)
Hemoglobin: 8.8 g/dL — ABNORMAL LOW (ref 13.0–18.0)
MCH: 32.4 pg (ref 26.0–34.0)
MCHC: 32.6 g/dL (ref 32.0–36.0)
MCV: 99.4 fL (ref 80.0–100.0)
Platelets: 126 10*3/uL — ABNORMAL LOW (ref 150–440)
RBC: 2.7 MIL/uL — ABNORMAL LOW (ref 4.40–5.90)
RDW: 13.4 % (ref 11.5–14.5)
WBC: 33.5 10*3/uL — ABNORMAL HIGH (ref 3.8–10.6)

## 2014-09-05 MED ORDER — HYDROCOD POLST-CPM POLST ER 10-8 MG/5ML PO SUER
5.0000 mL | Freq: Two times a day (BID) | ORAL | Status: DC
  Administered 2014-09-05 – 2014-09-07 (×5): 5 mL via ORAL
  Filled 2014-09-05 (×5): qty 5

## 2014-09-05 MED ORDER — METHYLPREDNISOLONE SODIUM SUCC 125 MG IJ SOLR
60.0000 mg | Freq: Four times a day (QID) | INTRAMUSCULAR | Status: DC
  Administered 2014-09-05 (×3): 60 mg via INTRAVENOUS
  Administered 2014-09-06: 14:00:00 125 mg via INTRAVENOUS
  Administered 2014-09-06 (×2): 60 mg via INTRAVENOUS
  Administered 2014-09-06: 125 mg via INTRAVENOUS
  Administered 2014-09-07 (×2): 60 mg via INTRAVENOUS
  Filled 2014-09-05 (×10): qty 2

## 2014-09-05 MED ORDER — ALBUTEROL SULFATE (2.5 MG/3ML) 0.083% IN NEBU
2.5000 mg | INHALATION_SOLUTION | RESPIRATORY_TRACT | Status: DC
  Administered 2014-09-05 – 2014-09-07 (×10): 2.5 mg via RESPIRATORY_TRACT
  Filled 2014-09-05 (×12): qty 3

## 2014-09-05 MED ORDER — TIOTROPIUM BROMIDE MONOHYDRATE 18 MCG IN CAPS
18.0000 ug | ORAL_CAPSULE | Freq: Every day | RESPIRATORY_TRACT | Status: DC
  Administered 2014-09-05 – 2014-09-07 (×3): 18 ug via RESPIRATORY_TRACT
  Filled 2014-09-05: qty 5

## 2014-09-05 NOTE — Progress Notes (Signed)
Congested, non-prod cough   Rogue Bussing, RRT

## 2014-09-05 NOTE — H&P (Deleted)
Patient Demographics  Lucas Newman, is a 81 y.o. male, DOB - 07-31-1919, GGY:[id number]  Admit date - 09/04/2014   Admitting Physician Fritzi Mandes, MD  Outpatient Primary MD for the patient is Lavera Guise, MD    Chief Complaint  Patient presents with  . Cough      No Fever-chills, No Headache, No changes with Vision or hearing, No problems swallowing food or Liquids, No Chest pain,+for Cough and Shortness of Breath, No Abdominal pain, No Nausea or Vommitting, Bowel movements are regular No Blood in stool or Urine, No dysuria, No new skin rashes or bruises, No new joints pains-aches,  +gen weakness,no tingling or numbness in any extremity, No recent weight gain or loss, No polyuria, polydypsia or polyphagia  Subjective:   Lucas Newman today has, No headache, No chest pain, No abdominal pain - No Nausea, No new weakness tingling or numbness, No Cough - SOB.  Lucas Newman is a 81 y.o. male with past medical history of temporal arteritis CVA comes into the emergency room with increasing shortness of breath cough productive greenish yellowish phlegm for 3 days patient in the emergency room was found to have right lower pneumonia. He received IV rocephin and zithromax. No fever at present. Denies Cp -wbc count is 31K (on chronic steroids for Temporal arteritis) Feels poorly, coughs, c/o pains in abdomen due to cough. T max 99.5 today, some sputum, wheezing  Objective:   Filed Vitals:   09/05/14 0012 09/05/14 0421 09/05/14 0524 09/05/14 0532  BP:   124/61 117/59  Pulse: 93  105 103  Temp:   99.5 F (37.5 C) 99.1 F (37.3 C)  TempSrc:   Oral Oral  Resp:   18   Height:      Weight:      SpO2:  92% 93% 89%    Wt Readings from Last 3 Encounters:  09/04/14 84.324 kg (185 lb 14.4 oz)  06/08/14 86.183 kg (190 lb)  05/11/14 85.73 kg (189 lb)     Intake/Output Summary (Last 24 hours) at 09/05/14 1133 Last data filed at 09/05/14 0800  Gross per 24 hour  Intake     240 ml  Output    600 ml  Net   -360 ml    ROS:  CONSTITUTIONAL: No documented fever. No fatigue and weakness. No weight gain or weight loss.  EYES: No blurry or double vision.  EARS, NOSE, THROAT: No tinnitus. No postnasal drip. No redness of the oropharynx.  RESPIRATORY:  No cough. No wheeze. No hemoptysis. Positive dyspnea.  CARDIOVASCULAR: No chest pain. No orthopnea. No peripheral edema. No dyspnea on exertion. No palpitation or syncope.  GASTROINTESTINAL: No nausea, no vomiting, no diarrhea. No abdominal pain. No melena or hematochezia.  GENITOURINARY: No dysuria or hematuria.  ENDOCRINE: No polyuria, nocturia, heat or cold intolerance.   HEMATOLOGIC:  No anemia, no bruising, no bleeding.  INTEGUMENTARY: No rashes. No lesions.  MUSCULOSKELETAL: No arthritis, no swelling, no gout.  NEUROLOGIC: No numbness or tingling. No ataxia. No seizure-type activity.  PSYCHIATRIC: No anxiety, no insomnia. No ADD.    Physical Exam   Gen - Awake Alert, Oriented X 3, uncomfortable, coughing, in mild to moderate distress, short of breat with movements HEENT - AT, [place],PERRL, moist oral mucosa.   Neck - Supple Neck,No JVD, No cervical lymphadenopathy appriciated.  Lung - Symmetrical Chest wall movement, poor air movement bilaterally at bases, rales, rhonchi, wheezing b./l, dullness to percussion L base,. diminished sounds on the L basilarly Heart -  RRR,No Gallops,Rubs or new Murmurs, No Parasternal Heave, few extra beats intermittently Abg - +ve B.Sounds, Abd Soft, No tenderness, No organomegaly appriciated, No rebound - guarding or rigidity. Ext - No Cyanosis, Clubbing or edema, No new Rash or bruise  Neuro - AAO X 3, no focal motor or sensory defecits b/l. Can not sit up in bed by himself  Data Review   Micro Results No results found for this or any previous visit (from the past 240 hour(s)).  Radiology Reports Dg Chest 2 View  09/04/2014   CLINICAL DATA:  Cough  EXAM: CHEST  2 VIEW   COMPARISON:  04/08/2014  FINDINGS: Mild left lower lobe opacity, atelectasis versus pneumonia. Mild right basilar opacity, likely atelectasis. No pleural effusion or pneumothorax.  The heart is top-normal in size.  Degenerative changes of the visualized thoracolumbar spine.  IMPRESSION: Mild left lower lobe opacity, atelectasis versus pneumonia.   Electronically Signed   By: Julian Hy M.D.   On: 09/04/2014 12:45    CBC  Recent Labs Lab 09/04/14 1200 09/05/14 0503  WBC 31.3* 33.5*  HGB 9.4* 8.8*  HCT 29.6* 26.9*  PLT 149* 126*  MCV 98.9 99.4  MCH 31.5 32.4  MCHC 31.9* 32.6  RDW 13.5 13.4    Chemistries   Recent Labs Lab 09/04/14 1200 09/05/14 0503  NA 138 139  K 4.0 3.9  CL 102 106  CO2 27 24  GLUCOSE 180* 151*  BUN 31* 29*  CREATININE 1.78* 1.72*  CALCIUM 8.8* 8.0*  AST 19  --   ALT 12*  --   ALKPHOS 46  --   BILITOT 0.6  --    ------------------------------------------------------------------------------------------------------------------ estimated creatinine clearance is 28.4 mL/min (by C-G formula based on Cr of 1.72). ------------------------------------------------------------------------------------------------------------------ No results for input(s): HGBA1C in the last 72 hours. ------------------------------------------------------------------------------------------------------------------ No results for input(s): CHOL, HDL, LDLCALC, TRIG, CHOLHDL, LDLDIRECT in the last 72 hours. ------------------------------------------------------------------------------------------------------------------ No results for input(s): TSH, T4TOTAL, T3FREE, THYROIDAB in the last 72 hours.  Invalid input(s): FREET3 ------------------------------------------------------------------------------------------------------------------ No results for input(s): VITAMINB12, FOLATE, FERRITIN, TIBC, IRON, RETICCTPCT in the last 72 hours.  Coagulation profile No results for  input(s): INR, PROTIME in the last 168 hours.  No results for input(s): DDIMER in the last 72 hours.  Cardiac Enzymes  Recent Labs Lab 09/04/14 1200  TROPONINI 0.02   ------------------------------------------------------------------------------------------------------------------ Invalid input(s): POCBNP       Assessment & Plan   Active Problems:   Community acquired pneumonia   Sepsis     1. Sepsis due to community acquired pneumonia left LLL -Continue IV rocephin and zithromax -getting BC and sputum cx, awaiting for results -f/u wbc count  2. COPD exacerbation, add solumedrol IV every 6 hours, tiotropium  continue duonebs, humibid, tussionex, following clinically 3.Leucocytosis, due to pneumonia and some reactive due to chronic steroids (for temporal arteritis), following with therapy in am  4.Acute on CKD-III, baseline creat 1.5, continue IVF, following Cr in am  5.h/o CVA, simvastatin, asa, PT  6.DVT Prophylaxis Heparin SQ 7 pneumonia LLL, contineu Ab, get sputum, if possible, adjust according results      Medications  Scheduled Meds: . albuterol  2.5 mg Nebulization Q4H  . azithromycin  250 mg Oral Daily  . benzonatate  100 mg Oral TID  . cefTRIAXone (ROCEPHIN)  IV  1 g Intravenous Q24H  . chlorpheniramine-HYDROcodone  5 mL Oral Q12H  . dipyridamole-aspirin  1 capsule Oral BID  . docusate sodium  100 mg Oral Daily  . finasteride  5 mg Oral Daily  . flunisolide  2 spray Nasal Daily  . guaiFENesin  600 mg Oral BID  . heparin  5,000 Units Subcutaneous 3 times per day  . loratadine  10 mg Oral Daily  . methylPREDNISolone (SOLU-MEDROL) injection  60 mg Intravenous Q6H  . multivitamin with minerals  1 tablet Oral Daily  . pantoprazole  40 mg Oral Daily  . simvastatin  20 mg Oral Daily  . tamsulosin  0.4 mg Oral Daily  . tiotropium  18 mcg Inhalation Daily   Continuous Infusions: . sodium chloride 75 mL/hr at 09/05/14 1002   PRN  Meds:.acetaminophen **OR** acetaminophen, bisacodyl, HYDROcodone-acetaminophen, ondansetron **OR** ondansetron (ZOFRAN) IV, polyethylene glycol   DVT Prophylaxis   Heparin - SCDs    Code Status: full  Family Communication: son is present in the room, discussed with him today face to face  Disposition Plan: rehab may be needed   Time Spent in minutes   35 min   Yariela Tison M.D on 09/05/2014 at 11:33 AM  [facility]  [phone]

## 2014-09-05 NOTE — Care Management (Signed)
Admitted to [facility] with the diagnosis of community acquired pneumonia. Lives with wife, Lucas Newman [phone]). Daughter is Lucas Newman [phone] or [phone]). [facility] in 2002 and 2004 following total knee replacement. No skilled facility. No home oxygen. Sees Dr Clayborn Bigness. Last seen 3 months ago. Takes care of all basic and [place] activities of daily living. Still drives. Family will transport. Physical therapy evaluation completed. Recommends skilled facility. Possibly home with homehealth pending progress. Shelbie Ammons RN MSN Care Management [phone]

## 2014-09-05 NOTE — Progress Notes (Signed)
Patient Demographics  Lucas Newman, is a 81 y.o. male, DOB - 1919/05/29, KVQ:[id number]  Admit date - 09/04/2014 Admitting Physician Fritzi Mandes, MD  Outpatient Primary MD for the patient is Lavera Guise, MD    Chief Complaint  Patient presents with  . Cough    No Fever-chills, No Headache, No changes with Vision or hearing, No problems swallowing food or Liquids, No Chest pain,+for Cough and Shortness of Breath, No Abdominal pain, No Nausea or Vommitting, Bowel movements are regular No Blood in stool or Urine, No dysuria, No new skin rashes or bruises, No new joints pains-aches,  +gen weakness,no tingling or numbness in any extremity, No recent weight gain or loss, No polyuria, polydypsia or polyphagia  Subjective:   Lucas Newman today has, No headache, No chest pain, No abdominal pain - No Nausea, No new weakness tingling or numbness, No Cough - SOB.  Lucas Newman is a 81 y.o. male with past medical history of temporal arteritis CVA comes into the emergency room with increasing shortness of breath cough productive greenish yellowish phlegm for 3 days patient in the emergency room was found to have right lower pneumonia. He received IV rocephin and zithromax. No fever at present. Denies Cp -wbc count is 31K (on chronic steroids for Temporal arteritis) Feels poorly, coughs, c/o pains in abdomen due to cough. T max 99.5 today, some sputum, wheezing  Objective:   Filed Vitals:   09/05/14 0012 09/05/14 0421 09/05/14 0524 09/05/14 0532  BP:   124/61 117/59  Pulse: 93  105 103  Temp:   99.5 F (37.5 C) 99.1 F (37.3 C)  TempSrc:   Oral Oral  Resp:   18   Height:      Weight:      SpO2:  92% 93% 89%    Wt Readings from Last 3 Encounters:  09/04/14 84.324 kg (185 lb 14.4 oz)  06/08/14 86.183 kg (190 lb)  05/11/14 85.73 kg (189 lb)     Intake/Output Summary (Last 24 hours) at 09/05/14  1133 Last data filed at 09/05/14 0800  Gross per 24 hour  Intake  240 ml  Output  600 ml  Net  -360 ml    ROS:  CONSTITUTIONAL: No documented fever. No fatigue and weakness. No weight gain or weight loss.  EYES: No blurry or double vision.  EARS, NOSE, THROAT: No tinnitus. No postnasal drip. No redness of the oropharynx.  RESPIRATORY: No cough. No wheeze. No hemoptysis. Positive dyspnea.  CARDIOVASCULAR: No chest pain. No orthopnea. No peripheral edema. No dyspnea on exertion. No palpitation or syncope.  GASTROINTESTINAL: No nausea, no vomiting, no diarrhea. No abdominal pain. No melena or hematochezia.  GENITOURINARY: No dysuria or hematuria.  ENDOCRINE: No polyuria, nocturia, heat or cold intolerance.  HEMATOLOGIC: No anemia, no bruising, no bleeding.  INTEGUMENTARY: No rashes. No lesions.  MUSCULOSKELETAL: No arthritis, no swelling, no gout.  NEUROLOGIC: No numbness or tingling. No ataxia. No seizure-type activity.  PSYCHIATRIC: No anxiety, no insomnia. No ADD.    Physical Exam   Gen - Awake Alert, Oriented X 3, uncomfortable, coughing, in mild to moderate distress, short of breat with movements HEENT - AT, [place],PERRL, moist oral mucosa.  Neck - Supple Neck,No JVD, No cervical lymphadenopathy appriciated.  Lung - Symmetrical Chest wall movement, poor air movement bilaterally at bases, rales, rhonchi, wheezing b./l, dullness to percussion L base,. diminished sounds on the L basilarly Heart - RRR,No Gallops,Rubs or new Murmurs, No Parasternal Heave, few extra beats intermittently  Abg - +ve B.Sounds, Abd Soft, No tenderness, No organomegaly appriciated, No rebound - guarding or rigidity. Ext - No Cyanosis, Clubbing or edema, No new Rash or bruise  Neuro - AAO X 3, no focal motor or sensory defecits b/l. Can not sit up in bed by himself  Data Review   Micro Results No results found for this or any previous visit (from the past 240  hour(s)).  Radiology Reports  Imaging Results (Last 48 hours)    Dg Chest 2 View  09/04/2014 CLINICAL DATA: Cough EXAM: CHEST 2 VIEW COMPARISON: 04/08/2014 FINDINGS: Mild left lower lobe opacity, atelectasis versus pneumonia. Mild right basilar opacity, likely atelectasis. No pleural effusion or pneumothorax. The heart is top-normal in size. Degenerative changes of the visualized thoracolumbar spine. IMPRESSION: Mild left lower lobe opacity, atelectasis versus pneumonia. Electronically Signed By: Julian Hy M.D. On: 09/04/2014 12:45     CBC  Last Labs      Recent Labs Lab 09/04/14 1200 09/05/14 0503  WBC 31.3* 33.5*  HGB 9.4* 8.8*  HCT 29.6* 26.9*  PLT 149* 126*  MCV 98.9 99.4  MCH 31.5 32.4  MCHC 31.9* 32.6  RDW 13.5 13.4      Chemistries   Last Labs      Recent Labs Lab 09/04/14 1200 09/05/14 0503  NA 138 139  K 4.0 3.9  CL 102 106  CO2 27 24  GLUCOSE 180* 151*  BUN 31* 29*  CREATININE 1.78* 1.72*  CALCIUM 8.8* 8.0*  AST 19 --   ALT 12* --   ALKPHOS 46 --   BILITOT 0.6 --      ------------------------------------------------------------------------------------------------------------------ estimated creatinine clearance is 28.4 mL/min (by C-G formula based on Cr of 1.72). ------------------------------------------------------------------------------------------------------------------  Recent Labs (last 2 labs)     No results for input(s): HGBA1C in the last 72 hours.   ------------------------------------------------------------------------------------------------------------------  Recent Labs (last 2 labs)     No results for input(s): CHOL, HDL, LDLCALC, TRIG, CHOLHDL, LDLDIRECT in the last 72 hours.   ------------------------------------------------------------------------------------------------------------------  Recent Labs (last 2 labs)     No results for  input(s): TSH, T4TOTAL, T3FREE, THYROIDAB in the last 72 hours.  Invalid input(s): FREET3   ------------------------------------------------------------------------------------------------------------------  Recent Labs (last 2 labs)     No results for input(s): VITAMINB12, FOLATE, FERRITIN, TIBC, IRON, RETICCTPCT in the last 72 hours.    Coagulation profile  Last Labs     No results for input(s): INR, PROTIME in the last 168 hours.     Recent Labs (last 2 labs)     No results for input(s): DDIMER in the last 72 hours.    Cardiac Enzymes  Last Labs      Recent Labs Lab 09/04/14 1200  TROPONINI 0.02     ------------------------------------------------------------------------------------------------------------------  Last Labs     Invalid input(s): POCBNP        Assessment & Plan   Active Problems:  Community acquired pneumonia  Sepsis     1. Sepsis due to community acquired pneumonia left LLL -Continue IV rocephin and zithromax -getting BC and sputum cx, awaiting for results -f/u wbc count  2. COPD exacerbation, add solumedrol IV every 6 hours, tiotropium continue duonebs, humibid, tussionex, following clinically 3.Leucocytosis, due to pneumonia and some reactive due to chronic steroids (for temporal arteritis), following with therapy in am  4.Acute on CKD-III, baseline creat 1.5, continue IVF, following Cr in am  5.h/o CVA, simvastatin, asa, PT  6.DVT Prophylaxis Heparin SQ 7 pneumonia LLL, contineu Ab, get  sputum, if possible, adjust according results      Medications  Scheduled Meds: . albuterol 2.5 mg Nebulization Q4H  . azithromycin 250 mg Oral Daily  . benzonatate 100 mg Oral TID  . cefTRIAXone (ROCEPHIN) IV 1 g Intravenous Q24H  . chlorpheniramine-HYDROcodone 5 mL Oral Q12H  . dipyridamole-aspirin 1 capsule Oral BID  . docusate sodium 100 mg Oral Daily  . finasteride 5 mg Oral  Daily  . flunisolide 2 spray Nasal Daily  . guaiFENesin 600 mg Oral BID  . heparin 5,000 Units Subcutaneous 3 times per day  . loratadine 10 mg Oral Daily  . methylPREDNISolone (SOLU-MEDROL) injection 60 mg Intravenous Q6H  . multivitamin with minerals 1 tablet Oral Daily  . pantoprazole 40 mg Oral Daily  . simvastatin 20 mg Oral Daily  . tamsulosin 0.4 mg Oral Daily  . tiotropium 18 mcg Inhalation Daily   Continuous Infusions: . sodium chloride 75 mL/hr at 09/05/14 1002   PRN Meds:.acetaminophen **OR** acetaminophen, bisacodyl, HYDROcodone-acetaminophen, ondansetron **OR** ondansetron (ZOFRAN) IV, polyethylene glycol   DVT Prophylaxis Heparin - SCDs    Code Status: full  Family Communication: son is present in the room, discussed with him today face to face  Disposition Plan: rehab may be needed   Time Spent in minutes 35 min   Love Milbourne M.D on 09/05/2014 at 11:33 AM  [place] [phone]        Revision History     Date/Time User Provider Type Action   09/05/2014 4:24 PM Theodoro Grist, MD Physician Addend   09/05/2014 11:49 AM Theodoro Grist, MD Physician Sign   View Details Report       Routing History     Date/Time From To Method   09/05/2014 4:24 PM Theodoro Grist, MD Lavera Guise, MD In [place]

## 2014-09-05 NOTE — Plan of Care (Signed)
Problem: Discharge Progression Outcomes Goal: Activity appropriate for discharge plan Outcome: Progressing MD making rounds. Improvement noted. Plan to discharge 09/06/2014. O2 sat improved from 89% to 92% on 1L O2 via [place] after aerosol treatment.  Ambulating independently.

## 2014-09-05 NOTE — Plan of Care (Signed)
Problem: Discharge Progression Outcomes Goal: Barriers To Progression Addressed/Resolved Individualization of Care 1. Goes by [employer] 2. Lives with wife 3. hx of CVA, temporal arteritis, hyperlipidemia, GERD, and arthritis controlled by home meds Goal: Other Discharge Outcomes/Goals Plan of Care progress to goal for   Pneumonia -94% on RA -ABT continues  -tessalon given for cough with improvement -prn neb given with improvement

## 2014-09-05 NOTE — Evaluation (Signed)
Physical Therapy Evaluation Patient Details Name: Lucas Newman MRN: [id number] DOB: December 04, 1919 Today's Date: 09/05/2014   History of Present Illness  Pt is a 81 y.o. male presenting to hospital with increasing SOB, productive (greenish/yellowish phlegm) cough x3 days, and tachycardia.  Pt admitted with sepsis d/t community acquired LLL PNA.  Clinical Impression  Pt is a 81 y.o. Male admitted with sepsis d/t community acquired LLL PNA.  Prior to admission, pt was independent ambulating with [place] in home and community.  Pt also with h/o falls in past year (none lately per family).  Pt lives with his wife on main floor of home (3 steps to enter with railing).  Currently pt is SBA with bed mobility, CGA with transfers, and min assist with ambulation with [place] (pt SOB with distance).  Pt appears deconditioned from baseline.  Pt would benefit from skilled PT to address impairments with balance, strength, and limitations with functional mobility.  Currently recommend pt discharge to STR, but pt requesting home.    Follow Up Recommendations SNF (Currently recommend STR but if pt's functional mobility improves during hospital stay, pt may be able to discharge home with HHPT and support of family)    Equipment Recommendations   (Pt prefers [place] but may benefit from walker for more stability)    Recommendations for Other Services       Precautions / Restrictions Precautions Precautions: Fall Restrictions Weight Bearing Restrictions: No      Mobility  Bed Mobility Overal bed mobility: Needs Assistance Bed Mobility: Supine to Sit;Sit to Supine     Supine to sit: Supervision;HOB elevated Sit to supine: Supervision;HOB elevated   General bed mobility comments: Increased time to perform (pt normally sleeps in recliner at home)  Transfers Overall transfer level: Needs assistance Equipment used: Straight cane Transfers: Sit to/from [employer] Transfers Sit to Stand: Min guard Stand  pivot transfers: Min guard          Ambulation/Gait Ambulation/Gait assistance: Min assist Ambulation Distance (Feet): 100 Feet Assistive device: Straight cane Gait Pattern/deviations: Decreased step length - right;Decreased step length - left;Decreased stride length;Decreased dorsiflexion - right;Decreased dorsiflexion - left     General Gait Details: pt initially CGA but last 20 feet pt with mild R knee buckling 1x (pt able to self correct with min assist of therapist) and then pt min assist to steady last 20 feet (pt appearing fatigued and with increased SOB)  Stairs            Wheelchair Mobility    Modified Rankin (Stroke Patients Only)       Balance Overall balance assessment: History of Falls (pt's family reports multiple falls in past year but none lately)                                           Pertinent Vitals/Pain Pain Assessment: No/denies pain    Home Living Family/patient expects to be discharged to:: Private residence Living Arrangements: Spouse/significant other Available Help at Discharge: Family Type of Home:  (Town-home) Home Access: Stairs to enter   [employer] of Steps: 3 steps to enter (B rail front stairs; 1 rail in garage) Home Layout: Able to live on main level with bedroom/bathroom Home Equipment: Walker - 4 wheels;Cane - single point;Tub bench      Prior Function Level of Independence: Independent with assistive device(s)  Comments: use of [place]     Hand Dominance        Extremity/Trunk Assessment   Upper Extremity Assessment: Overall WFL for tasks assessed           Lower Extremity Assessment: Overall WFL for tasks assessed         Communication   Communication:  (possible HOH; requires restatement of information intermittently)  Cognition Arousal/Alertness: Awake/alert   Overall Cognitive Status: Within Functional Limits for tasks assessed                       General Comments      Exercises        Assessment/Plan    PT Assessment Patient needs continued PT services  PT Diagnosis Difficulty walking;Generalized weakness;Abnormality of gait   PT Problem List Decreased strength;Decreased activity tolerance;Decreased balance;Decreased mobility  PT Treatment Interventions DME instruction;Gait training;Stair training;Functional mobility training;Therapeutic activities;Therapeutic exercise;Patient/family education;Balance training   PT Goals (Current goals can be found in the Care Plan section) Acute Rehab PT Goals Patient Stated Goal: To have less SOB PT Goal Formulation: With patient Time For Goal Achievement: 09/19/14 Potential to Achieve Goals: Good    Frequency Min 2X/week   Barriers to discharge   Would require 24/7 assist for safety    Co-evaluation               End of Session Equipment Utilized During Treatment: Gait belt Activity Tolerance: Patient limited by fatigue Patient left: in bed;with call bell/phone within reach;with bed alarm set;with family/visitor present           Time: 1112-1140 PT Time Calculation (min) (ACUTE ONLY): 28 min   Charges:   PT Evaluation $Initial PT Evaluation Tier I: 1 Procedure     PT G CodesLeitha Bleak, PT 09/05/2014, 1:45 PM

## 2014-09-06 LAB — CBC
HCT: 25.8 % — ABNORMAL LOW (ref 40.0–52.0)
HEMOGLOBIN: 8.4 g/dL — AB (ref 13.0–18.0)
MCH: 32.2 pg (ref 26.0–34.0)
MCHC: 32.6 g/dL (ref 32.0–36.0)
MCV: 98.8 fL (ref 80.0–100.0)
Platelets: 126 10*3/uL — ABNORMAL LOW (ref 150–440)
RBC: 2.61 MIL/uL — ABNORMAL LOW (ref 4.40–5.90)
RDW: 13.6 % (ref 11.5–14.5)
WBC: 23.3 10*3/uL — ABNORMAL HIGH (ref 3.8–10.6)

## 2014-09-06 LAB — BASIC METABOLIC PANEL
Anion gap: 7 (ref 5–15)
BUN: 39 mg/dL — AB (ref 6–20)
CHLORIDE: 110 mmol/L (ref 101–111)
CO2: 22 mmol/L (ref 22–32)
Calcium: 7.7 mg/dL — ABNORMAL LOW (ref 8.9–10.3)
Creatinine, Ser: 1.88 mg/dL — ABNORMAL HIGH (ref 0.61–1.24)
GFR calc Af Amer: 34 mL/min — ABNORMAL LOW (ref >60)
GFR, EST NON AFRICAN AMERICAN: 29 mL/min — AB (ref >60)
GLUCOSE: 287 mg/dL — AB (ref 65–99)
POTASSIUM: 3.8 mmol/L (ref 3.5–5.1)
SODIUM: 139 mmol/L (ref 135–145)

## 2014-09-06 LAB — URINALYSIS COMPLETE WITH MICROSCOPIC (ARMC ONLY)
BACTERIA UA: NONE SEEN
Bilirubin Urine: NEGATIVE
Glucose, UA: >500 mg/dL — AB
Ketones, ur: NEGATIVE mg/dL
LEUKOCYTES UA: NEGATIVE
Nitrite: NEGATIVE
PH: 5 (ref 5.0–8.0)
Protein, ur: 30 mg/dL — AB
SPECIFIC GRAVITY, URINE: 1.017 (ref 1.005–1.030)

## 2014-09-06 MED ORDER — DIPHENHYDRAMINE HCL 25 MG PO CAPS
25.0000 mg | ORAL_CAPSULE | Freq: Every evening | ORAL | Status: DC | PRN
  Administered 2014-09-06 (×2): 25 mg via ORAL
  Filled 2014-09-06 (×2): qty 1

## 2014-09-06 NOTE — Clinical Social Work Note (Signed)
Clinical Social Work Assessment  Patient Details  Name: Lucas Newman MRN: [id number] Date of Birth: 1920-04-15  Date of referral:  09/06/14               Reason for consult:  Facility Placement                Permission sought to share information with:  Family Supports Permission granted to share information::  Yes, Verbal Permission Granted  Name::      (wife and family)  Agency::   (SNF reps)  Relationship::   (family)  Contact Information:     Housing/Transportation Living arrangements for the past 2 months:  Single Family Home Source of Information:  Patient Patient Interpreter Needed:  None Criminal Activity/Legal Involvement Pertinent to Current Situation/Hospitalization:  No - Comment as needed Significant Relationships:  Adult Children, Spouse Lives with:    Do you feel safe going back to the place where you live?  Yes Need for family participation in patient care:  Yes (Comment)  Care giving concerns: Wife may be limited in her ability to provide care at home  Social Worker assessment / plan:  CSW will complete FL2 and PASARR for SNF search  Employment status:  Retired Forensic scientist:  Medicare PT Recommendations:  [place] / Referral to community resources:   (SNF's)  Patient/Family's Response to care:  Patient agreeable to SNF search as above- he is still very focused on his cough and "someone fixing it". Patient/Family's Understanding of and Emotional Response to Diagnosis, Current Treatment, and Prognosis:  Patient wants to get home as soon as he can but does agree with SNF search if needed. Emotional Assessment Appearance:  Appears stated age Attitude/Demeanor/Rapport:    Affect (typically observed):  Appropriate Orientation:  Oriented to Self, Oriented to Place, Oriented to  Time Alcohol / Substance use:  Not Applicable Psych involvement (Current and /or in the community):  No (Comment)  Discharge Needs  Concerns to  be addressed:  Home Safety Concerns Readmission within the last 30 days:  No Current discharge risk:  Physical Impairment Barriers to Discharge:  No Barriers Identified   Ludwig Clarks, LCSW 09/06/2014, 2:47 PM

## 2014-09-06 NOTE — Plan of Care (Signed)
Problem: Discharge Progression Outcomes Goal: Other Discharge Outcomes/Goals Outcome: Progressing Plan of Care progress to goal for   Pneumonia -94% on RA -ABT continues   -tessalon given for cough with improvement -prn neb given with improvement

## 2014-09-06 NOTE — Clinical Social Work Note (Signed)
Clinical Social Work Assessment  Patient Details  Name: Lucas Newman MRN: [id number] Date of Birth: 1919/07/12  Date of referral:  09/06/14               Reason for consult:  Facility Placement                Permission sought to share information with:  Family Supports Permission granted to share information::  Yes, Verbal Permission Granted  Name::      (wife and family)  Agency::   (SNF reps)  Relationship::   (family)  Contact Information:     Housing/Transportation Living arrangements for the past 2 months:  Single Family Home Source of Information:  Patient Patient Interpreter Needed:  None Criminal Activity/Legal Involvement Pertinent to Current Situation/Hospitalization:  No - Comment as needed Significant Relationships:  Adult Children, Spouse Lives with:    Do you feel safe going back to the place where you live?  Yes Need for family participation in patient care:  Yes (Comment)  Care giving concerns:   PT recommending SNF   Social Worker assessment / plan:  CSW met with patient at bedside- introduced self and role. Patient appears to understand the SNF option- stating his wife was at a SNF recently for rehab.  He is hoping to go home but is agreeable to SNF if needed. He asked lots of appropriate questions related to length of SNF stay, coverage, etc. If he has to go to SNF he prefers [facility] or [facility] (where his wife was).    Employment status:  Retired Forensic scientist:  [employer] PT Recommendations:  [place] / Referral to community resources:   (SNF's)  Patient/Family's Response to care:  Patient understands and agrees to SNF search at this time- he wants to see how he progresses before making a final decision but is open to seeking SNF options.   Patient/Family's Understanding of and Emotional Response to Diagnosis, Current Treatment, and Prognosis: CSW will plan to discuss SNF option with his family as well  but at this time he is agreeable to SNF search. Patient is very focused on his cough- stating; "I've had this cough for 4 months and noone can help me". He is frustrated with this and wants the cough gone. Patient states he and his wife have considered moving to a retirement community but doesn't feel like this would be financially possible. CSW will pursue SNF options and plan to update patient and family tomorrow.    Emotional Assessment Appearance:  Appears stated age Attitude/Demeanor/Rapport:    Affect (typically observed):  Appropriate Orientation:  Oriented to Self, Oriented to Place, Oriented to  Time Alcohol / Substance use:  Not Applicable Psych involvement (Current and /or in the community):  No (Comment)  Discharge Needs  Concerns to be addressed:  Home Safety Concerns Readmission within the last 30 days:  No Current discharge risk:  Physical Impairment Barriers to Discharge:  No Barriers Identified   Ludwig Clarks, LCSW 09/06/2014, 2:28 PM

## 2014-09-06 NOTE — Clinical Social Work Placement (Signed)
   CLINICAL SOCIAL WORK PLACEMENT  NOTE  Date:  09/06/2014  Patient Details  Name: COVEY BALLER MRN: [id number] Date of Birth: [birth year]  Clinical Social Work is seeking post-discharge placement for this patient at the [place] level of care (*CSW will initial, date and re-position this form in  chart as items are completed):  Yes   Patient/family provided with [facility] Work Department's list of facilities offering this level of care within the geographic area requested by the patient (or if unable, by the patient's family).  Yes   Patient/family informed of their freedom to choose among providers that offer the needed level of care, that participate in Medicare, Medicaid or managed care program needed by the patient, have an available bed and are willing to accept the patient.  Yes   Patient/family informed of [place] [facility]'s ownership interest in [facility] and [facility], as well as of the fact that they are under no obligation to receive care at these facilities.  PASRR submitted to EDS on 09/06/14     PASRR number received on 09/06/14     Existing PASRR number confirmed on       FL2 transmitted to all facilities in geographic area requested by pt/family on 09/06/14     FL2 transmitted to all facilities within larger geographic area on       Patient informed that his/her managed care company has contracts with or will negotiate with certain facilities, including the following:            Patient/family informed of bed offers received.  Patient chooses bed at       Physician recommends and patient chooses bed at      Patient to be transferred to   on  .  Patient to be transferred to facility by       Patient family notified on   of transfer.  Name of family member notified:        PHYSICIAN Please sign FL2, Please prepare priority discharge summary, including medications, Please prepare prescriptions     Additional  Comment:    _______________________________________________ Ludwig Clarks, LCSW 09/06/2014, 2:52 PM

## 2014-09-06 NOTE — Progress Notes (Signed)
Physical Therapy Treatment Patient Details Name: Lucas Newman MRN: [id number] DOB: February 27, 1920 Today's Date: 09/06/2014    History of Present Illness Pt is a 81 y.o. male presenting to hospital with increasing SOB, productive (greenish/yellowish phlegm) cough x3 days, and tachycardia.  Pt admitted with sepsis d/t community acquired LLL PNA.    PT Comments    Pt appears with improved steadiness with RW (compared to [facility]) and pt admitting that he feels better using RW; pt reports that he wants to use his 4ww at home instead of a RW though.  Pt required rest breaks between ex's and ambulation d/t SOB and fatigue.  Pt making progress but appears deconditioned from baseline and HR increased from 100 bpm at rest to 113-128 bpm with activity (nursing notified); pt's O2 97% at rest and 90-91% with activity.  Continue to recommend STR d/t fall risk concerns but will continue to assess pt's progress during hospital stay.  Follow Up Recommendations  SNF     Equipment Recommendations  Rolling walker with 5" wheels    Recommendations for Other Services       Precautions / Restrictions Precautions Precautions: Fall Restrictions Weight Bearing Restrictions: No    Mobility  Bed Mobility Overal bed mobility: Needs Assistance Bed Mobility: Supine to Sit;Sit to Supine     Supine to sit: Supervision;HOB elevated Sit to supine: Supervision;HOB elevated   General bed mobility comments: Increased time to perform (pt normally sleeps in recliner at home)  Transfers Overall transfer level: Needs assistance Equipment used:  (min assist with [place] and CGA with RW); multiple attempts required to stand with CGA with RW. Transfers: Sit to/from [employer] to Stand: Min guard;Min assist Stand pivot transfers: Min guard;Min assist          Ambulation/Gait Ambulation/Gait assistance: Min guard;Min assist Ambulation Distance (Feet):  (160 feet x2 (1st trial with [place]; 2nd trial with  RW)) Assistive device: Straight cane;Rolling walker (2 wheeled) Gait Pattern/deviations: Decreased step length - right;Decreased step length - left;Decreased stride length     General Gait Details: pt CGA to occasionally min assist to steady with [place] and pt min assist to steady d/t posterior loss of balance turning in hallway with [place]; pt CGA with ambulation using RW; distance limited d/t SOB and fatigue.   Stairs            Wheelchair Mobility    Modified Rankin (Stroke Patients Only)       Balance                                    Cognition Arousal/Alertness: Awake/alert   Overall Cognitive Status: Within Functional Limits for tasks assessed                      Exercises   Performed semi-supine B LE therapeutic exercise x 10 reps:  Ankle pumps (AROM B LE's); quad sets x3 second holds (AROM B LE's); glute squeezes x3 second holds (AROM B); SAQ's (AROM R; AROM L); heelslides (AROM R; AROM L), hip abd/adduction (AROM R; AROM L).  Pt required vc's and tactile cues for correct technique with exercises.     General Comments  Pt required a few minutes for rest breaks between ex's and ambulation d/t SOB and fatigue.      Pertinent Vitals/Pain Pain Assessment: No/denies pain    Home Living  Prior Function            PT Goals (current goals can now be found in the care plan section) Acute Rehab PT Goals Patient Stated Goal: To have less SOB and coughing PT Goal Formulation: With patient Time For Goal Achievement: 09/19/14 Potential to Achieve Goals: Good Progress towards PT goals: Progressing toward goals    Frequency  Min 2X/week    PT Plan Current plan remains appropriate    Co-evaluation             End of Session Equipment Utilized During Treatment: Gait belt Activity Tolerance: Patient limited by fatigue Patient left: in bed;with call bell/phone within reach;with bed alarm set;with family/visitor  present     Time: 0347-4259 PT Time Calculation (min) (ACUTE ONLY): 27 min  Charges:  $Gait Training: 8-22 mins $Therapeutic Exercise: 8-22 mins                    G CodesLeitha Bleak 2014/09/25, 4:47 PM [name], [place]

## 2014-09-06 NOTE — Progress Notes (Signed)
Patient ID: Lucas Newman, male   DOB: Dec 09, 1919, 81 y.o.   MRN: [id number]  Patient Demographics  Lucas Newman, is a 81 y.o. male, DOB - January 13, 1920, [id number]  Admit date - 09/04/2014 Admitting Physician Fritzi Mandes, MD  Outpatient Primary MD for the patient is Lavera Guise, MD    Chief Complaint  Patient presents with  . Cough    No Fever-chills, No Headache, No changes with Vision or hearing, No problems swallowing food or Liquids, No Chest pain,+for Cough and Shortness of Breath, No Abdominal pain, No Nausea or Vommitting, Bowel movements are regular No Blood in stool or Urine, No dysuria, No new skin rashes or bruises, No new joints pains-aches,  +gen weakness,no tingling or numbness in any extremity, No recent weight gain or loss, No polyuria, polydypsia or polyphagia  Subjective:     Lucas Newman is a 81 y.o. male with past medical history of temporal arteritis CVA comes into the emergency room with increasing shortness of breath cough productive greenish yellowish phlegm for 3 days patient in the emergency room was found to have right lower pneumonia. He received IV rocephin and zithromax. No fever at present. Denies Cp -wbc count is 31K (on chronic steroids for Temporal arteritis) Feels some stronger overall, still lots of  cough, c/o pains in abdomen due to cough. T max below 99.0 today, has lots of sputum, some , but less wheezing. Overall stronger, but still weak, PT recommends 24/7 supervision at home , wife is disabled  Objective:   Filed Vitals:   09/05/14 0012 09/05/14 0421 09/05/14 0524 09/05/14 0532  BP:   124/61 117/59  Pulse: 93  105 103  Temp:   99.5 F (37.5 C) 99.1 F (37.3 C)  TempSrc:   Oral Oral  Resp:   18   Height:      Weight:      SpO2:  92% 93% 89%    Wt Readings from Last 3 Encounters:  09/04/14 84.324 kg (185 lb 14.4 oz)  06/08/14 86.183 kg (190  lb)  05/11/14 85.73 kg (189 lb)     Intake/Output Summary (Last 24 hours) at 09/05/14 1133 Last data filed at 09/05/14 0800  Gross per 24 hour  Intake  240 ml  Output  600 ml  Net  -360 ml    ROS:  CONSTITUTIONAL: No documented fever. Some fatigue and weakness. No weight gain or weight loss.  EYES: No blurry or double vision.  EARS, NOSE, THROAT: No tinnitus. No postnasal drip. No redness of the oropharynx.  RESPIRATORY: C./o cough. Has wheezing. No hemoptysis. Less dyspnea.  CARDIOVASCULAR: No chest pain. No orthopnea. No peripheral edema. No dyspnea on exertion. No palpitation or syncope.  GASTROINTESTINAL: No nausea, no vomiting, no diarrhea. No abdominal pain. No melena or hematochezia.  GENITOURINARY: No dysuria or hematuria.  ENDOCRINE: No polyuria, nocturia, heat or cold intolerance.  HEMATOLOGIC: No anemia, no bruising, no bleeding.  INTEGUMENTARY: No rashes. No lesions.  MUSCULOSKELETAL: No arthritis, no swelling, no gout.  NEUROLOGIC: No numbness or tingling. No ataxia. No seizure-type activity.  PSYCHIATRIC: No anxiety, no insomnia. No ADD.    Physical Exam   Gen - Awake Alert, Oriented X 3, more comfortable today, still lots of  coughing, in mild  distress, less short of breath HEENT - AT, [place],PERRL, moist oral mucosa.  Neck - Supple Neck,No JVD, No cervical lymphadenopathy appriciated.  Lung - Symmetrical Chest wall movement, better air movement bilaterally at bases, still some rales, rhonchi,  wheezing b./l, dullness to percussion L base,. diminished sounds on the L basilar, but overall better air entrance Heart - RRR,No Gallops,Rubs or new Murmurs, No Parasternal Heave Abg - +ve B.Sounds, Abd Soft, No tenderness, No organomegaly appriciated, No rebound - guarding or rigidity. Ext - No Cyanosis, Clubbing or edema, No new Rash or bruise  Neuro - AAO X 3, no focal motor or sensory defecits b/l. Remains weak, can not sit up in bed  by himself  Data Review   Micro Results No results found for this or any previous visit (from the past 240 hour(s)).  Radiology Reports  Imaging Results (Last 48 hours)    Dg Chest 2 View  09/04/2014 CLINICAL DATA: Cough EXAM: CHEST 2 VIEW COMPARISON: 04/08/2014 FINDINGS: Mild left lower lobe opacity, atelectasis versus pneumonia. Mild right basilar opacity, likely atelectasis. No pleural effusion or pneumothorax. The heart is top-normal in size. Degenerative changes of the visualized thoracolumbar spine. IMPRESSION: Mild left lower lobe opacity, atelectasis versus pneumonia. Electronically Signed By: Julian Hy M.D. On: 09/04/2014 12:45     CBC  Last Labs      Recent Labs Lab 09/04/14 1200 09/05/14 0503  WBC 31.3* 33.5*  HGB 9.4* 8.8*  HCT 29.6* 26.9*  PLT 149* 126*  MCV 98.9 99.4  MCH 31.5 32.4  MCHC 31.9* 32.6  RDW 13.5 13.4      Chemistries   Last Labs      Recent Labs Lab 09/04/14 1200 09/05/14 0503  NA 138 139  K 4.0 3.9  CL 102 106  CO2 27 24  GLUCOSE 180* 151*  BUN 31* 29*  CREATININE 1.78* 1.72*  CALCIUM 8.8* 8.0*  AST 19 --   ALT 12* --   ALKPHOS 46 --   BILITOT 0.6 --      ------------------------------------------------------------------------------------------------------------------ estimated creatinine clearance is 28.4 mL/min (by C-G formula based on Cr of 1.72). ------------------------------------------------------------------------------------------------------------------  Recent Labs (last 2 labs)     No results for input(s): HGBA1C in the last 72 hours.   ------------------------------------------------------------------------------------------------------------------  Recent Labs (last 2 labs)     No results for input(s): CHOL, HDL, LDLCALC, TRIG, CHOLHDL, LDLDIRECT in the last 72 hours.    ------------------------------------------------------------------------------------------------------------------  Recent Labs (last 2 labs)     No results for input(s): TSH, T4TOTAL, T3FREE, THYROIDAB in the last 72 hours.  Invalid input(s): FREET3   ------------------------------------------------------------------------------------------------------------------  Recent Labs (last 2 labs)     No results for input(s): VITAMINB12, FOLATE, FERRITIN, TIBC, IRON, RETICCTPCT in the last 72 hours.    Coagulation profile  Last Labs     No results for input(s): INR, PROTIME in the last 168 hours.     Recent Labs (last 2 labs)     No results for input(s): DDIMER in the last 72 hours.    Cardiac Enzymes  Last Labs      Recent Labs Lab 09/04/14 1200  TROPONINI 0.02     ------------------------------------------------------------------------------------------------------------------  Last Labs     Invalid input(s): POCBNP        Assessment & Plan   Active Problems:  Community acquired pneumonia  Sepsis     1. Sepsis due to community acquired pneumonia left LLL -Continue IV rocephin and zithromax -BC cx NTD, sputum cx are pending  -wbc count is better today  2. COPD exacerbation, improved overall, continue solumedrol IV every 6 hours, tiotropium,  continue duonebs, humibid, tussionex, improved clinically 3.Leucocytosis, due to pneumonia and some reactive due to chronic steroids (for temporal arteritis),  improved with therapy  4.Acute on CKD-III, baseline creat 1.5, per report, but seen as high as 2 in the past, no improvement with IVF, dc IVF,  following Cr in am  5.h/o CVA, simvastatin, asa, PT, would benefit from rehab, in my opinion  6.DVT Prophylaxis Heparin SQ   7. pneumonia LLL due to unknown etiologic agent at persent, contineu Ab, get sputum, if possible, adjust according results, better clinically      Medications  Scheduled Meds: .  albuterol 2.5 mg Nebulization Q4H  . azithromycin 250 mg Oral Daily  . benzonatate 100 mg Oral TID  . cefTRIAXone (ROCEPHIN) IV 1 g Intravenous Q24H  . chlorpheniramine-HYDROcodone 5 mL Oral Q12H  . dipyridamole-aspirin 1 capsule Oral BID  . docusate sodium 100 mg Oral Daily  . finasteride 5 mg Oral Daily  . flunisolide 2 spray Nasal Daily  . guaiFENesin 600 mg Oral BID  . heparin 5,000 Units Subcutaneous 3 times per day  . loratadine 10 mg Oral Daily  . methylPREDNISolone (SOLU-MEDROL) injection 60 mg Intravenous Q6H  . multivitamin with minerals 1 tablet Oral Daily  . pantoprazole 40 mg Oral Daily  . simvastatin 20 mg Oral Daily  . tamsulosin 0.4 mg Oral Daily  . tiotropium 18 mcg Inhalation Daily   Continuous Infusions: . sodium chloride 75 mL/hr at 09/05/14 1002   PRN Meds:.acetaminophen **OR** acetaminophen, bisacodyl, HYDROcodone-acetaminophen, ondansetron **OR** ondansetron (ZOFRAN) IV, polyethylene glycol   DVT Prophylaxis Heparin - SCDs    Code Status: full  Family Communication: son is present in the room, discussed with him today face to face  Disposition Plan: rehab may be needed   Time Spent in minutes 35 min D/w care management  Avianna Moynahan M.D on 09/05/2014 at 11:33 AM  [place] [phone]        Revision History     Date/Time User Provider Type Action   09/05/2014 4:24 PM Theodoro Grist, MD Physician Addend   09/05/2014 11:49 AM Theodoro Grist, MD Physician Sign   View Details Report       Routing History     Date/Time From To Method   09/05/2014 4:24 PM Theodoro Grist, MD Lavera Guise, MD In [place]

## 2014-09-06 NOTE — Progress Notes (Signed)
Inpatient Diabetes Program Recommendations  AACE/ADA: New Consensus Statement on Inpatient Glycemic Control (2013)  Target Ranges:  Prepandial:   less than 140 mg/dL      Peak postprandial:   less than 180 mg/dL (1-2 hours)      Critically ill patients:  140 - 180 mg/dL   Results for GIOVONI, Lucas Newman (MRN [id number]) as of 09/06/2014 10:22  Ref. Range 09/04/2014 12:00 09/05/2014 05:03 09/06/2014 04:47  Glucose Latest Ref Range: 65-99 mg/dL 180 (H) 151 (H) 287 (H)    Reason for assessment: elevated lab glucose  Diabetes history: None noted Outpatient Diabetes medications: none Current orders for Inpatient glycemic control: none  Based on current lab glucose and steroid administration, please consider starting this patient on Lantus 8 units (0.1units/kg) qhs.  Consider starting Novolog sensitive correction scale tid (0-9 units tid) with meals.   Consider ordering an A1C to determine blood sugar control over the past 3 months.  Gentry Fitz, RN, BA, MHA, CDE Diabetes Coordinator Inpatient Diabetes Program  [phone] (Team Pager) [phone] [facility]) 09/06/2014 10:26 AM

## 2014-09-07 ENCOUNTER — Inpatient Hospital Stay: Payer: Medicare Other

## 2014-09-07 DIAGNOSIS — J181 Lobar pneumonia, unspecified organism: Secondary | ICD-10-CM

## 2014-09-07 DIAGNOSIS — J441 Chronic obstructive pulmonary disease with (acute) exacerbation: Secondary | ICD-10-CM

## 2014-09-07 DIAGNOSIS — J189 Pneumonia, unspecified organism: Secondary | ICD-10-CM

## 2014-09-07 LAB — BASIC METABOLIC PANEL
Anion gap: 10 (ref 5–15)
BUN: 47 mg/dL — AB (ref 6–20)
CHLORIDE: 110 mmol/L (ref 101–111)
CO2: 22 mmol/L (ref 22–32)
Calcium: 8.2 mg/dL — ABNORMAL LOW (ref 8.9–10.3)
Creatinine, Ser: 1.74 mg/dL — ABNORMAL HIGH (ref 0.61–1.24)
GFR calc Af Amer: 37 mL/min — ABNORMAL LOW (ref >60)
GFR calc non Af Amer: 32 mL/min — ABNORMAL LOW (ref >60)
GLUCOSE: 197 mg/dL — AB (ref 65–99)
POTASSIUM: 3.7 mmol/L (ref 3.5–5.1)
SODIUM: 142 mmol/L (ref 135–145)

## 2014-09-07 LAB — CBC
HEMATOCRIT: 26.4 % — AB (ref 40.0–52.0)
Hemoglobin: 8.4 g/dL — ABNORMAL LOW (ref 13.0–18.0)
MCH: 31.2 pg (ref 26.0–34.0)
MCHC: 31.7 g/dL — ABNORMAL LOW (ref 32.0–36.0)
MCV: 98.7 fL (ref 80.0–100.0)
Platelets: 151 10*3/uL (ref 150–440)
RBC: 2.68 MIL/uL — ABNORMAL LOW (ref 4.40–5.90)
RDW: 13.4 % (ref 11.5–14.5)
WBC: 31.5 10*3/uL — AB (ref 3.8–10.6)

## 2014-09-07 MED ORDER — BENZONATATE 100 MG PO CAPS: 100.0000 mg | ORAL_CAPSULE | Freq: Three times a day (TID) | ORAL | Status: DC

## 2014-09-07 MED ORDER — METHYLPREDNISOLONE 4 MG PO TBPK: ORAL_TABLET | ORAL | Status: DC

## 2014-09-07 MED ORDER — DIPHENHYDRAMINE HCL 25 MG PO CAPS: 25.0000 mg | ORAL_CAPSULE | Freq: Every evening | ORAL | Status: DC | PRN

## 2014-09-07 MED ORDER — AZITHROMYCIN 250 MG PO TABS: 250.0000 mg | ORAL_TABLET | Freq: Every day | ORAL | Status: DC

## 2014-09-07 MED ORDER — GUAIFENESIN ER 600 MG PO TB12: 600.0000 mg | ORAL_TABLET | Freq: Two times a day (BID) | ORAL | Status: DC

## 2014-09-07 MED ORDER — HYDROCOD POLST-CPM POLST ER 10-8 MG/5ML PO SUER: 5.0000 mL | Freq: Two times a day (BID) | ORAL | Status: DC | PRN

## 2014-09-07 MED ORDER — FLUTICASONE PROPIONATE 50 MCG/ACT NA SUSP
2.0000 | Freq: Every day | NASAL | Status: DC
  Filled 2014-09-07: qty 16

## 2014-09-07 MED ORDER — ALBUTEROL SULFATE (2.5 MG/3ML) 0.083% IN NEBU: 2.5000 mg | INHALATION_SOLUTION | RESPIRATORY_TRACT | Status: DC

## 2014-09-07 MED ORDER — CEFTRIAXONE SODIUM IN DEXTROSE 20 MG/ML IV SOLN
1.0000 g | INTRAVENOUS | Status: DC
  Filled 2014-09-07: qty 50

## 2014-09-07 MED ORDER — BISACODYL 10 MG RE SUPP: 10.0000 mg | Freq: Every day | RECTAL | Status: DC | PRN

## 2014-09-07 NOTE — Progress Notes (Addendum)
SNF bed offers presented to family- Radiation protection practitioner bed selected- bed confirmed for today. Will alert MD and await final dc orders- patient has gone down for CXR but family indicates he is agreeable to plans.    Eduard Clos, MSW, [place]

## 2014-09-07 NOTE — Discharge Summary (Signed)
[facility] NAME: Lucas Newman    MR#:  [id number]  DATE OF BIRTH:  June 13, 1919  DATE OF ADMISSION:  09/04/2014 ADMITTING PHYSICIAN: Fritzi Mandes, MD  DATE OF DISCHARGE: 5.4.16.  PRIMARY CARE PHYSICIAN: Lavera Guise, MD    ADMISSION DIAGNOSIS:  Community acquired pneumonia [J18.9]  DISCHARGE DIAGNOSIS:  Active Problems:   * No active hospital problems. *   SECONDARY DIAGNOSIS:   Past Medical History  Diagnosis Date  . Hyperlipidemia   . GERD (gastroesophageal reflux disease)   . Arthritis   . CVA (cerebral infarction)     HOSPITAL COURSE:   1. Sepsis due to community acquired pneumonia left LLL -Continue  Zithromax PO for 4 more days -BC cx NTD, sputum cx are not reported, repeated chest xray showed bibasilar opacities, more likely atelectasis, not pneumonia, also bronchitis changes -wbc count is high today, likely steroid related. Follow up with PCP in 2-3 days to follow up clinical curse  2. COPD exacerbation, improved overall, taper steroids, continue  duonebs, humibid, tussionex, improved clinically  3.Leukocytosis, due to pneumonia and steroids , follow with tapering steroid therapy as outpt  4.Acute on CKD-III, baseline creat 1.5, per report, but seen as high as 2 in the past, no improvement with IVF, off IVF now, Stable Cr, likely baseline at 1.74 today.   5.h/o CVA, simvastatin, asa, PT, would benefit from rehab, SW consulted, to rehab placement today  6.DVT Prophylaxis PT, ambulate  7. pneumonia LLL due to unknown etiologic agent at persent, most recent chest xray showed no pneumonia though, more likley atelectasis, bronchitic changes, continue Zithromax for 4 more days to complete course, sputum cx sent, but not yet reported , improved  Clinically overall   DISCHARGE CONDITIONS:   stable  CONSULTS OBTAINED:     DRUG ALLERGIES:  No Known Allergies  DISCHARGE MEDICATIONS:   Current  Discharge Medication List    START taking these medications   Details  albuterol (PROVENTIL) (2.5 MG/3ML) 0.083% nebulizer solution Take 3 mLs (2.5 mg total) by nebulization every 4 (four) hours. Qty: 75 mL, Refills: 12    azithromycin (ZITHROMAX) 250 MG tablet Take 1 tablet (250 mg total) by mouth daily. Qty: 4 each, Refills: 0    benzonatate (TESSALON) 100 MG capsule Take 1 capsule (100 mg total) by mouth 3 (three) times daily. Qty: 20 capsule, Refills: 0    bisacodyl (DULCOLAX) 10 MG suppository Place 1 suppository (10 mg total) rectally daily as needed for moderate constipation. Qty: 12 suppository, Refills: 0    chlorpheniramine-HYDROcodone (TUSSIONEX) 10-8 MG/5ML SUER Take 5 mLs by mouth every 12 (twelve) hours as needed for cough. Qty: 140 mL, Refills: 0    diphenhydrAMINE (BENADRYL) 25 mg capsule Take 1 capsule (25 mg total) by mouth at bedtime as needed for sleep. Qty: 30 capsule, Refills: 0    guaiFENesin (MUCINEX) 600 MG 12 hr tablet Take 1 tablet (600 mg total) by mouth 2 (two) times daily. Qty: 20 tablet, Refills: 0    methylPREDNISolone (MEDROL DOSEPAK) 4 MG TBPK tablet follow package directions Qty: 21 tablet, Refills: 0      CONTINUE these medications which have NOT CHANGED   Details  acetaminophen (TYLENOL) 650 MG CR tablet Take 650 mg by mouth every 8 (eight) hours as needed for pain.    CALCIUM-MAGNESIUM-ZINC PO Take 1 tablet by mouth daily.    calcium-vitamin D (OSCAL) 250-125 MG-UNIT per tablet Take 1 tablet by mouth  daily.    dipyridamole-aspirin (AGGRENOX) 200-25 MG per 12 hr capsule Take 1 capsule by mouth 2 (two) times daily.     Docusate Calcium (STOOL SOFTENER PO) Take 1 tablet by mouth daily.    finasteride (PROSCAR) 5 MG tablet Take 5 mg by mouth daily.    loratadine (CLARITIN) 10 MG tablet Take 10 mg by mouth daily.    Magnesium 250 MG TABS Take 1 tablet by mouth daily.    Multiple Vitamin (MULTIVITAMIN WITH MINERALS) TABS tablet Take 1  tablet by mouth daily.    omeprazole (PRILOSEC) 20 MG capsule Take 20 mg by mouth daily.    predniSONE (DELTASONE) 5 MG tablet Take 7.5 mg by mouth daily.    simvastatin (ZOCOR) 20 MG tablet Take 20 mg by mouth daily.    tamsulosin (FLOMAX) 0.4 MG CAPS capsule Take 0.4 mg by mouth daily.    flunisolide (NASALIDE) 25 MCG/ACT (0.025%) SOLN Place 2 sprays into the nose daily.         DISCHARGE INSTRUCTIONS:    BMP , CBC to be repeated in 1-2  Weeks, follow up with PCP in 2-3 days  If you experience worsening of your admission symptoms, develop shortness of breath, life threatening emergency, suicidal or homicidal thoughts you must seek medical attention immediately by calling 911 or calling your MD immediately  if symptoms less severe.  You Must read complete instructions/literature along with all the possible adverse reactions/side effects for all the Medicines you take and that have been prescribed to you. Take any new Medicines after you have completely understood and accept all the possible adverse reactions/side effects.   Please note  You were cared for by a hospitalist during your hospital stay. If you have any questions about your discharge medications or the care you received while you were in the hospital after you are discharged, you can call the unit and asked to speak with the hospitalist on call if the hospitalist that took care of you is not available. Once you are discharged, your primary care physician will handle any further medical issues. Please note that NO REFILLS for any discharge medications will be authorized once you are discharged, as it is imperative that you return to your primary care physician (or establish a relationship with a primary care physician if you do not have one) for your aftercare needs so that they can reassess your need for medications and monitor your lab values.    Today   CHIEF COMPLAINT:   Chief Complaint  Patient presents with  .  Cough    HISTORY OF PRESENT ILLNESS:  Lucas Newman  is a 81 y.o. male with a known history of CVA comes to hospital with increasing shortness of breath cough productive greenish yellowish phlegm for 3 days, he was noted to have pneumonia in LLL on chest xray, admitted, treated with Rocephin/ zithromax, steroids, inhalers, improved, seen by  PT, recommended rehab placement where pt will be dc today, May 4th, 2016.    VITAL SIGNS:  Blood pressure 111/72, pulse 65, temperature 97.7 F (36.5 C), temperature source Oral, resp. rate 18, height 5\' 7"  (1.702 m), weight 84.324 kg (185 lb 14.4 oz), SpO2 99 %.  I/O:   Intake/Output Summary (Last 24 hours) at 09/07/14 1225 Last data filed at 09/07/14 0600  Gross per 24 hour  Intake    240 ml  Output    800 ml  Net   -560 ml    PHYSICAL EXAMINATION:  GENERAL:  81 y.o.-year-old patient lying in the bed with no acute distress.  EYES: Pupils equal, round, reactive to light and accommodation. No scleral icterus. Extraocular muscles intact.  HEENT: Head atraumatic, normocephalic. Oropharynx and nasopharynx clear.  NECK:  Supple, no jugular venous distention. No thyroid enlargement, no tenderness.  LUNGS: Normal breath sounds bilaterally, few wheezes, rales at bases, no rhonchi or crepitation. No use of accessory muscles of respiration. Some coarse breath sounds b/l at bases.  CARDIOVASCULAR: S1, S2 normal. No murmurs, rubs, or gallops.  ABDOMEN: Soft, non-tender, non-distended. Bowel sounds present. No organomegaly or mass.  EXTREMITIES: No pedal edema, cyanosis, or clubbing.  NEUROLOGIC: Cranial nerves II through XII are intact. Muscle strength 5/5 in all extremities. Sensation intact. Gait not checked.  PSYCHIATRIC: The patient is alert and oriented x 3.  SKIN: No obvious rash, lesion, or ulcer.   DATA REVIEW:   CBC  Recent Labs Lab 09/07/14 0453  WBC 31.5*  HGB 8.4*  HCT 26.4*  PLT 151    Chemistries   Recent Labs Lab  09/04/14 1200  09/07/14 0453  NA 138  < > 142  K 4.0  < > 3.7  CL 102  < > 110  CO2 27  < > 22  GLUCOSE 180*  < > 197*  BUN 31*  < > 47*  CREATININE 1.78*  < > 1.74*  CALCIUM 8.8*  < > 8.2*  AST 19  --   --   ALT 12*  --   --   ALKPHOS 46  --   --   BILITOT 0.6  --   --   < > = values in this interval not displayed.  Cardiac Enzymes  Recent Labs Lab 09/04/14 1200  TROPONINI 0.02    Microbiology Results  Results for orders placed or performed during the hospital encounter of 09/04/14  Culture, blood (routine x 2)     Status: None (Preliminary result)   Collection Time: 09/04/14  3:30 PM  Result Value Ref Range Status   Specimen Description BLOOD  Final   Special Requests NONE  Final   Culture NO GROWTH 2 DAYS  Final   Report Status PENDING  Incomplete  Culture, blood (routine x 2)     Status: None (Preliminary result)   Collection Time: 09/04/14  3:45 PM  Result Value Ref Range Status   Specimen Description BLOOD  Final   Special Requests NONE  Final   Culture NO GROWTH 2 DAYS  Final   Report Status PENDING  Incomplete    RADIOLOGY:  Dg Chest 2 View  09/07/2014   CLINICAL DATA:  Followup left lower lobe pneumonia.  EXAM: CHEST  2 VIEW  COMPARISON:  09/04/2014  FINDINGS: There is bibasilar atelectasis. Mild peribronchial thickening. Heart is normal size. No effusions or acute bony abnormality.  IMPRESSION: Bibasilar opacities are felt represent atelectasis. Mild bronchitic changes.   Electronically Signed   By: Rolm Baptise M.D.   On: 09/07/2014 11:26    EKG:   Orders placed or performed during the hospital encounter of 09/04/14  . ED EKG  . ED EKG  . EKG 12-Lead  . EKG 12-Lead      Management plans discussed with the patient, family and they are in agreement.  CODE STATUS:     Code Status Orders        Start     Ordered   09/04/14 1505  Full code   Continuous     09/04/14 1511  TOTAL TIME TAKING CARE OF THIS PATIENT: 40  minutes.     Theodoro Grist M.D on 09/07/2014 at 12:25 PM  Between 7am to 6pm - Pager - [phone]  After 6pm go to [email or web address] - password [email or web address] [facility]  Office  [phone]  CC: Primary care physician; Lavera Guise, MD

## 2014-09-07 NOTE — Discharge Instructions (Addendum)
Follow up with DR.  Stephanie Coup in 2-3 days

## 2014-09-07 NOTE — Progress Notes (Signed)
Patient ID: Lucas Newman, male   DOB: [birth year], 81 y.o.   MRN: [id number]  Patient Demographics  Lucas Newman, is a 81 y.o. male, DOB - 11-05-1919, OEV:[id number]  Admit date - 09/04/2014 Admitting Physician Fritzi Mandes, MD  Outpatient Primary MD for the patient is Lavera Guise, MD    Chief Complaint  Patient presents with  . Cough    No Fever-chills, No Headache, No changes with Vision or hearing, No problems swallowing food or Liquids, No Chest pain,+for Cough and Shortness of Breath, No Abdominal pain, No Nausea or Vommitting, Bowel movements are regular No Blood in stool or Urine, No dysuria, No new skin rashes or bruises, No new joints pains-aches,  +gen weakness,no tingling or numbness in any extremity, No recent weight gain or loss, No polyuria, polydypsia or polyphagia  Subjective:     Lucas Newman is a 81 y.o. male with past medical history of temporal arteritis CVA comes into the emergency room with increasing shortness of breath cough productive greenish yellowish phlegm for 3 days patient in the emergency room was found to have right lower pneumonia. He received IV rocephin and zithromax. No fever at present. Denies Cp, still feels weak, wants to go to rehab -wbc count is 31K again,  still lots of  cough, c/o pains in abdomen due to cough.  Objective:   Filed Vitals:   09/05/14 0012 09/05/14 0421 09/05/14 0524 09/05/14 0532  BP:   124/61 117/59  Pulse: 93  105 103  Temp:   99.5 F (37.5 C) 99.1 F (37.3 C)  TempSrc:   Oral Oral  Resp:   18   Height:      Weight:      SpO2:  92% 93% 89%    Wt Readings from Last 3 Encounters:  09/04/14 84.324 kg (185 lb 14.4 oz)  06/08/14 86.183 kg (190 lb)  05/11/14 85.73 kg (189 lb)     Intake/Output Summary (Last 24 hours) at 09/05/14 1133 Last data filed at 09/05/14 0800  Gross per 24 hour  Intake  240 ml  Output   600 ml  Net  -360 ml    ROS:  CONSTITUTIONAL: No documented fever. Some fatigue and weakness. No weight gain or weight loss.  EYES: No blurry or double vision.  EARS, NOSE, THROAT: No tinnitus. No postnasal drip. No redness of the oropharynx.  RESPIRATORY: C./o cough. Has less wheezing. No hemoptysis. Some dyspnea, but on RA at repsent.  CARDIOVASCULAR: No chest pain. No orthopnea. No peripheral edema. No dyspnea on exertion. No palpitation or syncope.  GASTROINTESTINAL: No nausea, no vomiting, no diarrhea. No abdominal pain. No melena or hematochezia.  GENITOURINARY: No dysuria or hematuria.  ENDOCRINE: No polyuria, nocturia, heat or cold intolerance.  HEMATOLOGIC: No anemia, no bruising, no bleeding.  INTEGUMENTARY: No rashes. No lesions.  MUSCULOSKELETAL: No arthritis, no swelling, no gout.  NEUROLOGIC: No numbness or tingling. No ataxia. No seizure-type activity.  PSYCHIATRIC: No anxiety, no insomnia. No ADD.    Physical Exam   Gen - Awake Alert, Oriented X 3, more comfortable overall, some dry coughing, in no distress today, moving freely in bed, less short of breath HEENT - AT, [place],PERRL, moist oral mucosa.  Neck - Supple Neck,No JVD, No cervical lymphadenopathy appriciated.  Lung - Symmetrical Chest wall movement, better air movement bilaterally at bases, still some rales, rhonchi, wheezing b./l, no dullness to percussion L base, crackles on  the R diffusely, but overall better air entrance Heart -  RRR,No Gallops,Rubs or new Murmurs, No Parasternal Heave Abg - +ve B.Sounds, Abd Soft, No tenderness, No organomegaly appriciated, No rebound - guarding or rigidity. Ext - No Cyanosis, Clubbing or edema, No new Rash or bruise  Neuro - AAO X 3, no focal motor or sensory defecits b/l. Remains weak, can not sit up in bed by himself  Data Review   Micro Results No results found for this or any previous visit (from the past 240 hour(s)).  Radiology Reports   Imaging Results (Last 48 hours)    Dg Chest 2 View  09/04/2014 CLINICAL DATA: Cough EXAM: CHEST 2 VIEW COMPARISON: 04/08/2014 FINDINGS: Mild left lower lobe opacity, atelectasis versus pneumonia. Mild right basilar opacity, likely atelectasis. No pleural effusion or pneumothorax. The heart is top-normal in size. Degenerative changes of the visualized thoracolumbar spine. IMPRESSION: Mild left lower lobe opacity, atelectasis versus pneumonia. Electronically Signed By: Julian Hy M.D. On: 09/04/2014 12:45     CBC  Last Labs      Recent Labs Lab 09/04/14 1200 09/05/14 0503  WBC 31.3* 33.5*  HGB 9.4* 8.8*  HCT 29.6* 26.9*  PLT 149* 126*  MCV 98.9 99.4  MCH 31.5 32.4  MCHC 31.9* 32.6  RDW 13.5 13.4      Chemistries   Last Labs      Recent Labs Lab 09/04/14 1200 09/05/14 0503  NA 138 139  K 4.0 3.9  CL 102 106  CO2 27 24  GLUCOSE 180* 151*  BUN 31* 29*  CREATININE 1.78* 1.72*  CALCIUM 8.8* 8.0*  AST 19 --   ALT 12* --   ALKPHOS 46 --   BILITOT 0.6 --      ------------------------------------------------------------------------------------------------------------------ estimated creatinine clearance is 28.4 mL/min (by C-G formula based on Cr of 1.72). ------------------------------------------------------------------------------------------------------------------  Recent Labs (last 2 labs)     No results for input(s): HGBA1C in the last 72 hours.   ------------------------------------------------------------------------------------------------------------------  Recent Labs (last 2 labs)     No results for input(s): CHOL, HDL, LDLCALC, TRIG, CHOLHDL, LDLDIRECT in the last 72 hours.   ------------------------------------------------------------------------------------------------------------------  Recent Labs (last 2 labs)     No results for input(s): TSH, T4TOTAL, T3FREE,  THYROIDAB in the last 72 hours.  Invalid input(s): FREET3   ------------------------------------------------------------------------------------------------------------------  Recent Labs (last 2 labs)     No results for input(s): VITAMINB12, FOLATE, FERRITIN, TIBC, IRON, RETICCTPCT in the last 72 hours.    Coagulation profile  Last Labs     No results for input(s): INR, PROTIME in the last 168 hours.     Recent Labs (last 2 labs)     No results for input(s): DDIMER in the last 72 hours.    Cardiac Enzymes  Last Labs      Recent Labs Lab 09/04/14 1200  TROPONINI 0.02     ------------------------------------------------------------------------------------------------------------------  Last Labs     Invalid input(s): POCBNP        Assessment & Plan   Active Problems:  Community acquired pneumonia  Sepsis     1. Sepsis due to community acquired pneumonia left LLL -Continue IV rocephin and zithromax -BC cx NTD, sputum cx are pending  -wbc count is better today  2. COPD exacerbation, improved overall, taper steroids, continue tiotropium,  duonebs, humibid, tussionex, improved clinically 3.Leucocytosis, due to pneumonia and steroids , following with tapering therapy  4.Acute on CKD-III, baseline creat 1.5, per report, but seen as high as 2 in the past, no improvement with IVF, off IVF,  Stable Cr,  likely baseline  5.h/o CVA, simvastatin, asa, PT, would benefit from rehab, SW consulted  6.DVT Prophylaxis Heparin SQ   7. pneumonia LLL due to unknown etiologic agent at persent, contineu Ab, sent, but not yet reported sputum cx, some  better clinically, repeat chest xray      Medications  Scheduled Meds: . albuterol 2.5 mg Nebulization Q4H  . azithromycin 250 mg Oral Daily  . benzonatate 100 mg Oral TID  . cefTRIAXone (ROCEPHIN) IV 1 g Intravenous Q24H  . chlorpheniramine-HYDROcodone 5 mL Oral Q12H  .  dipyridamole-aspirin 1 capsule Oral BID  . docusate sodium 100 mg Oral Daily  . finasteride 5 mg Oral Daily  . flunisolide 2 spray Nasal Daily  . guaiFENesin 600 mg Oral BID  . heparin 5,000 Units Subcutaneous 3 times per day  . loratadine 10 mg Oral Daily  . methylPREDNISolone (SOLU-MEDROL) injection 60 mg Intravenous Q6H  . multivitamin with minerals 1 tablet Oral Daily  . pantoprazole 40 mg Oral Daily  . simvastatin 20 mg Oral Daily  . tamsulosin 0.4 mg Oral Daily  . tiotropium 18 mcg Inhalation Daily   Continuous Infusions: . sodium chloride 75 mL/hr at 09/05/14 1002   PRN Meds:.acetaminophen **OR** acetaminophen, bisacodyl, HYDROcodone-acetaminophen, ondansetron **OR** ondansetron (ZOFRAN) IV, polyethylene glycol   DVT Prophylaxis Heparin - SCDs    Code Status: full  Family Communication: son is present in the room, discussed with him today face to face  Disposition Plan: rehab    Time Spent in minutes 35 min D/w care management  Melanie Openshaw M.D on 09/05/2014 at 11:33 AM  [place] [phone]        Revision History     Date/Time User Provider Type Action   09/05/2014 4:24 PM Theodoro Grist, MD Physician Addend   09/05/2014 11:49 AM Theodoro Grist, MD Physician Sign   View Details Report       Routing History     Date/Time From To Method   09/05/2014 4:24 PM Theodoro Grist, MD Lavera Guise, MD In [place]

## 2014-09-07 NOTE — Plan of Care (Signed)
Problem: Discharge Progression Outcomes Goal: Other Discharge Outcomes/Goals Outcome: Progressing Pain-pt had no c/o pain this shift Hemodynamically-fluids d/c today Complications-pt continue to have increased coughing, prn sleep med given with relief Diet-pt ate well today per pt and family Activity-pt up to bathroom with assistance

## 2014-09-07 NOTE — Progress Notes (Signed)
Patient for d/c today to SNF bed at [facility]. Family and patient agreeable to this plan- plan transfer via EMS. Eduard Clos, MSW, Latanya Presser

## 2014-09-09 LAB — CULTURE, BLOOD (ROUTINE X 2)
CULTURE: NO GROWTH
CULTURE: NO GROWTH

## 2014-10-18 ENCOUNTER — Ambulatory Visit
Admission: RE | Admit: 2014-10-18 | Discharge: 2014-10-18 | Disposition: A | Discharge status: Home / Self Care | Payer: Medicare Other | Source: Ambulatory Visit | Attending: Physician Assistant | Admitting: Physician Assistant

## 2014-10-18 ENCOUNTER — Other Ambulatory Visit: Payer: Self-pay | Admitting: Physician Assistant

## 2014-10-18 DIAGNOSIS — M25531 Pain in right wrist: Secondary | ICD-10-CM | POA: Diagnosis present

## 2014-11-30 ENCOUNTER — Telehealth: Payer: Self-pay | Admitting: Pulmonary Disease

## 2014-11-30 NOTE — Telephone Encounter (Signed)
error 

## 2015-01-19 ENCOUNTER — Inpatient Hospital Stay: Payer: PRIVATE HEALTH INSURANCE | Attending: Internal Medicine

## 2015-03-20 ENCOUNTER — Emergency Department: Payer: Medicare Other

## 2015-03-20 ENCOUNTER — Encounter: Payer: Self-pay | Admitting: Emergency Medicine

## 2015-03-20 ENCOUNTER — Emergency Department
Admission: EM | Admit: 2015-03-20 | Discharge: 2015-03-20 | Disposition: A | Discharge status: Home / Self Care | Payer: Medicare Other | Attending: Emergency Medicine | Admitting: Emergency Medicine

## 2015-03-20 DIAGNOSIS — R05 Cough: Secondary | ICD-10-CM | POA: Insufficient documentation

## 2015-03-20 DIAGNOSIS — Z87891 Personal history of nicotine dependence: Secondary | ICD-10-CM | POA: Insufficient documentation

## 2015-03-20 DIAGNOSIS — R609 Edema, unspecified: Secondary | ICD-10-CM

## 2015-03-20 DIAGNOSIS — Z7952 Long term (current) use of systemic steroids: Secondary | ICD-10-CM | POA: Diagnosis not present

## 2015-03-20 DIAGNOSIS — R2243 Localized swelling, mass and lump, lower limb, bilateral: Secondary | ICD-10-CM | POA: Diagnosis present

## 2015-03-20 DIAGNOSIS — R059 Cough, unspecified: Secondary | ICD-10-CM

## 2015-03-20 DIAGNOSIS — N289 Disorder of kidney and ureter, unspecified: Secondary | ICD-10-CM | POA: Diagnosis not present

## 2015-03-20 DIAGNOSIS — Z79899 Other long term (current) drug therapy: Secondary | ICD-10-CM | POA: Diagnosis not present

## 2015-03-20 LAB — CBC
HEMATOCRIT: 27.6 % — AB (ref 40.0–52.0)
Hemoglobin: 8.9 g/dL — ABNORMAL LOW (ref 13.0–18.0)
MCH: 30.2 pg (ref 26.0–34.0)
MCHC: 32.1 g/dL (ref 32.0–36.0)
MCV: 93.9 fL (ref 80.0–100.0)
PLATELETS: 203 10*3/uL (ref 150–440)
RBC: 2.94 MIL/uL — ABNORMAL LOW (ref 4.40–5.90)
RDW: 14.9 % — ABNORMAL HIGH (ref 11.5–14.5)
WBC: 26.9 10*3/uL — ABNORMAL HIGH (ref 3.8–10.6)

## 2015-03-20 LAB — LACTIC ACID, PLASMA: Lactic Acid, Venous: 1.5 mmol/L (ref 0.5–2.0)

## 2015-03-20 LAB — COMPREHENSIVE METABOLIC PANEL
ALT: 13 U/L — ABNORMAL LOW (ref 17–63)
AST: 16 U/L (ref 15–41)
Albumin: 3.7 g/dL (ref 3.5–5.0)
Alkaline Phosphatase: 51 U/L (ref 38–126)
Anion gap: 7 (ref 5–15)
BILIRUBIN TOTAL: 0.6 mg/dL (ref 0.3–1.2)
BUN: 35 mg/dL — ABNORMAL HIGH (ref 6–20)
CHLORIDE: 107 mmol/L (ref 101–111)
CO2: 25 mmol/L (ref 22–32)
Calcium: 9 mg/dL (ref 8.9–10.3)
Creatinine, Ser: 2.18 mg/dL — ABNORMAL HIGH (ref 0.61–1.24)
GFR, EST AFRICAN AMERICAN: 28 mL/min — AB (ref >60)
GFR, EST NON AFRICAN AMERICAN: 24 mL/min — AB (ref >60)
Glucose, Bld: 131 mg/dL — ABNORMAL HIGH (ref 65–99)
POTASSIUM: 4.3 mmol/L (ref 3.5–5.1)
Sodium: 139 mmol/L (ref 135–145)
TOTAL PROTEIN: 7.3 g/dL (ref 6.5–8.1)

## 2015-03-20 LAB — BRAIN NATRIURETIC PEPTIDE: B Natriuretic Peptide: 125 pg/mL — ABNORMAL HIGH (ref 0.0–100.0)

## 2015-03-20 LAB — TROPONIN I

## 2015-03-20 MED ORDER — SODIUM CHLORIDE 0.9 % IV BOLUS (SEPSIS): 500.0000 mL | Freq: Once | INTRAVENOUS | Status: DC

## 2015-03-20 NOTE — Discharge Instructions (Signed)
Your x-ray did not show any new findings. You do not have an infiltrate noted on the x-ray. Your blood tests show your kidney insufficiency as well as the usual elevated white blood cell count that she will often half.  With the renal insufficiency that you have, using a diuretic is not advised. You may use some compression and elevation as discussed to help reduce the swelling in the legs. Follow-up with your regular physician. Return to emergency department if you have shortness of breath, chest pain, worsening cough, or other urgent concerns.   Edema Edema is an abnormal buildup of fluids in your bodytissues. Edema is somewhatdependent on gravity to pull the fluid to the lowest place in your body. That makes the condition more common in the legs and thighs (lower extremities). Painless swelling of the feet and ankles is common and becomes more likely as you get older. It is also common in looser tissues, like around your eyes.  When the affected area is squeezed, the fluid may move out of that spot and leave a dent for a few moments. This dent is called pitting.  CAUSES  There are many possible causes of edema. Eating too much salt and being on your feet or sitting for a long time can cause edema in your legs and ankles. Hot weather may make edema worse. Common medical causes of edema include:  Heart failure.  Liver disease.  Kidney disease.  Weak blood vessels in your legs.  Cancer.  An injury.  Pregnancy.  Some medications.  Obesity. SYMPTOMS  Edema is usually painless.Your skin may look swollen or shiny.  DIAGNOSIS  Your health care provider may be able to diagnose edema by asking about your medical history and doing a physical exam. You may need to have tests such as X-rays, an electrocardiogram, or blood tests to check for medical conditions that may cause edema.  TREATMENT  Edema treatment depends on the cause. If you have heart, liver, or kidney disease, you need the  treatment appropriate for these conditions. General treatment may include:  Elevation of the affected body part above the level of your heart.  Compression of the affected body part. Pressure from elastic bandages or support stockings squeezes the tissues and forces fluid back into the blood vessels. This keeps fluid from entering the tissues.  Restriction of fluid and salt intake.  Use of a water pill (diuretic). These medications are appropriate only for some types of edema. They pull fluid out of your body and make you urinate more often. This gets rid of fluid and reduces swelling, but diuretics can have side effects. Only use diuretics as directed by your health care provider. HOME CARE INSTRUCTIONS   Keep the affected body part above the level of your heart when you are lying down.   Do not sit still or stand for prolonged periods.   Do not put anything directly under your knees when lying down.  Do not wear constricting clothing or garters on your upper legs.   Exercise your legs to work the fluid back into your blood vessels. This may help the swelling go down.   Wear elastic bandages or support stockings to reduce ankle swelling as directed by your health care provider.   Eat a low-salt diet to reduce fluid if your health care provider recommends it.   Only take medicines as directed by your health care provider. SEEK MEDICAL CARE IF:   Your edema is not responding to treatment.  You  have heart, liver, or kidney disease and notice symptoms of edema.  You have edema in your legs that does not improve after elevating them.   You have sudden and unexplained weight gain. SEEK IMMEDIATE MEDICAL CARE IF:   You develop shortness of breath or chest pain.   You cannot breathe when you lie down.  You develop pain, redness, or warmth in the swollen areas.   You have heart, liver, or kidney disease and suddenly get edema.  You have a fever and your symptoms  suddenly get worse. MAKE SURE YOU:   Understand these instructions.  Will watch your condition.  Will get help right away if you are not doing well or get worse.   This information is not intended to replace advice given to you by your health care provider. Make sure you discuss any questions you have with your health care provider.   Document Released: 04/22/2005 Document Revised: 05/13/2014 Document Reviewed: 02/12/2013 Elsevier Interactive Patient Education [employer].

## 2015-03-20 NOTE — ED Notes (Signed)
Pt reports he has cough.  Pt finished zpak 3 days ago and continues to have a cough.  No chest pain.  Intermittent sob.  Pt also has swelling to feet. Sx for 2 days.  Pt from home.  Family at bedside.   Pt alert.  Speech clear.

## 2015-03-20 NOTE — ED Notes (Signed)
Attempt IV x 2 unsuccessful, able to draw blood.

## 2015-03-20 NOTE — ED Notes (Signed)
States had pneumonia one month ago, took z pak for this past illness and finished 3 days ago

## 2015-03-20 NOTE — ED Provider Notes (Addendum)
[facility] Emergency Department Provider Note  ____________________________________________  Time seen: 4:15  I have reviewed the triage vital signs and the nursing notes.   HISTORY  Chief Complaint Cough and Leg Swelling     HPI Lucas Newman is a 81 y.o. male presents to emergency Department due to concerns for a cough and for swelling of his lower extremities. He was diagnosed with pneumonia last May. He called his primary physician, Dr. Clayborn Bigness, due to his concerns for a recurrent pneumonia. He was placed on azithromycin last week and he completed a 5 day course 3 days ago. He denies any fever. He reports that he developed a cough a little bit more when he is lying down. He has reduced mobility in general but does not have any increased dyspnea on exertion. He has a history of some swelling in his lower extremities, however the swelling is worse at the moment. Patient denies any fever. He is alert and communicative and in no acute distress.    Past Medical History  Diagnosis Date  . Hyperlipidemia   . GERD (gastroesophageal reflux disease)   . Arthritis   . CVA (cerebral infarction)     Patient Active Problem List   Diagnosis Date Noted  . Cough 05/11/2014  . Shortness of breath 05/11/2014  . Temporal arteritis ([place]) 05/11/2014    Past Surgical History  Procedure Laterality Date  . Appendectomy    . Replacement total knee bilateral  2002/2004  . Carotid endarterectomy Left     Current Outpatient Rx  Name  Route  Sig  Dispense  Refill  . acetaminophen (TYLENOL) 650 MG CR tablet   Oral   Take 650 mg by mouth every 4 (four) hours as needed for pain.          . Calcium Carb-Cholecalciferol (CALCIUM 600+D) 600-800 MG-UNIT TABS   Oral   Take 1 tablet by mouth daily.         Marland Kitchen dipyridamole-aspirin (AGGRENOX) 200-25 MG per 12 hr capsule   Oral   Take 1 capsule by mouth 2 (two) times daily.          . finasteride (PROSCAR) 5 MG  tablet   Oral   Take 5 mg by mouth daily.         . fluticasone (FLONASE) 50 MCG/ACT nasal spray   Each Nare   Place 1 spray into both nostrils 2 (two) times daily as needed for rhinitis.         Marland Kitchen loratadine (CLARITIN) 10 MG tablet   Oral   Take 10 mg by mouth daily.         . Magnesium 250 MG TABS   Oral   Take 250 mg by mouth at bedtime.          . Multiple Minerals-Vitamins (CALCIUM-MAGNESIUM-ZINC-D3 PO)   Oral   Take 1 tablet by mouth daily.         . Multiple Vitamin (MULTIVITAMIN WITH MINERALS) TABS tablet   Oral   Take 1 tablet by mouth daily.         Marland Kitchen omeprazole (PRILOSEC) 20 MG capsule   Oral   Take 20 mg by mouth 2 (two) times daily.          . predniSONE (DELTASONE) 5 MG tablet   Oral   Take 2.5 mg by mouth daily.          Marland Kitchen senna-docusate (SENOKOT-S) 8.6-50 MG tablet   Oral  Take 1 tablet by mouth at bedtime.         . simvastatin (ZOCOR) 20 MG tablet   Oral   Take 20 mg by mouth at bedtime.          . tamsulosin (FLOMAX) 0.4 MG CAPS capsule   Oral   Take 0.4 mg by mouth at bedtime.            Allergies Review of patient's allergies indicates no known allergies.  Family History  Problem Relation Age of Onset  . Lung cancer Brother   . Diabetes type II Mother   . Hypertension Father     Social History Social History  Substance Use Topics  . Smoking status: Former Smoker -- 0.00 packs/day for 20 years    Quit date: 05/11/1984  . Smokeless tobacco: Never Used  . Alcohol Use: No     Comment: occasional beer/bloody mary    Review of Systems  Constitutional: Negative for fatigue. ENT: Negative for congestion. Cardiovascular: Negative for chest pain. Respiratory: Positive for cough. Gastrointestinal: Negative for abdominal pain, vomiting and diarrhea. Genitourinary: Negative for dysuria. Musculoskeletal: Notable for edema in bilateral lower extremities. Skin: Negative for rash. Neurological: Negative for  headache or focal weakness   10-point ROS otherwise negative.  ____________________________________________   PHYSICAL EXAM:  VITAL SIGNS: ED Triage Vitals  Enc Vitals Group     BP 03/20/15 1534 122/56 mmHg     Pulse Rate 03/20/15 1534 100     Resp 03/20/15 1534 20     Temp 03/20/15 1534 98 F (36.7 C)     Temp Source 03/20/15 1534 Oral     SpO2 03/20/15 1534 97 %     Weight 03/20/15 1534 180 lb (81.647 kg)     Height 03/20/15 1534 5\' 7"  (1.702 m)     Head Cir --      Peak Flow --      Pain Score 03/20/15 1535 6     Pain Loc --      Pain Edu? --      Excl. in [place]? --     Constitutional: Alert and oriented. Communicative, intact thought process, reading comfortably and in no acute distress.Marland Kitchen ENT   Head: Normocephalic and atraumatic.   Nose: No congestion/rhinnorhea.       Mouth: No erythema, no swelling   Cardiovascular: Normal rate, regular rhythm, no murmur noted Respiratory:  Normal respiratory effort, no tachypnea.    Breath sounds are clear and equal bilaterally.  Gastrointestinal: Soft and nontender. No distention.  Back: No muscle spasm, no tenderness, no CVA tenderness. Musculoskeletal: No deformity noted. Nontender with normal range of motion in all extremities. 2+ pitting edema bilaterally in the lower calves and feet. Neurologic:  Communicative. Normal appearing spontaneous movement in all 4 extremities. No gross focal neurologic deficits are appreciated.  Skin:  Skin is warm, dry. No rash noted. Psychiatric: Mood and affect are normal. Speech and behavior are normal.  ____________________________________________    LABS (pertinent positives/negatives)  Labs Reviewed  CBC - Abnormal; Notable for the following:    WBC 26.9 (*)    RBC 2.94 (*)    Hemoglobin 8.9 (*)    HCT 27.6 (*)    RDW 14.9 (*)    All other components within normal limits  COMPREHENSIVE METABOLIC PANEL - Abnormal; Notable for the following:    Glucose, Bld 131 (*)    BUN 35  (*)    Creatinine, Ser 2.18 (*)  ALT 13 (*)    GFR calc non Af Amer 24 (*)    GFR calc Af Amer 28 (*)    All other components within normal limits  BRAIN NATRIURETIC PEPTIDE - Abnormal; Notable for the following:    B Natriuretic Peptide 125.0 (*)    All other components within normal limits  CULTURE, BLOOD (ROUTINE X 2)  CULTURE, BLOOD (ROUTINE X 2)  TROPONIN I  LACTIC ACID, PLASMA     ____________________________________________   EKG  ED ECG REPORT I, Yuko Coventry W, the attending physician, personally viewed and interpreted this ECG.   Date: 03/20/2015  EKG Time: 1539   Rate: 91  Rhythm: Normal sinus rhythm with right bundle branch block  Axis: Normal  Intervals: PR of 212  ST&T Change: None noted   ____________________________________________    RADIOLOGY  Chest x-ray  IMPRESSION: No acute cardiopulmonary findings. Chronic lung changes.   ____________________________________________ ____________________________________________   INITIAL IMPRESSION / ASSESSMENT AND PLAN / ED COURSE  Pertinent labs & imaging results that were available during my care of the patient were reviewed by me and considered in my medical decision making (see chart for details).  Overall well-appearing, alert, appropriate 81 year old male. He is breathing pretty well. His O2 sat is 93-94% on room air. His chest x-ray shows no acute findings. His BNP is 125 and history upon and is not detectable. He does have a blood cell count of 26.9 thousand which is consistent with his prior blood counts. His renal function shows kidney dysfunction that is consistent with prior values as well.  At this time, I do not think the patient would benefit from any pharmaceutical intervention. He is still in a therapeutic zone for azithromycin and he does not have an infiltrate. I do not think she needs more antibiotics. I would be reluctant to place him on Lasix for his peripheral edema given his  compromised renal function. I have spoken with him and his daughter about using some compression and elevation to help reduce the swelling.   I've asked him to follow-up with his primary physician, Dr. Clayborn Bigness. He and his daughter agreed with discharge home given his overall stable appearance. I've called and spoke with Dr. Humphrey Rolls about this. We've reviewed the patient's lab results from earlier this year and today. She'll follow-up with him.  ____________________________________________   FINAL CLINICAL IMPRESSION(S) / ED DIAGNOSES  Final diagnoses:  Cough  Peripheral edema  Renal insufficiency      Ahmed Prima, MD 03/20/15 1712  Ahmed Prima, MD 03/20/15 [phone]

## 2015-03-23 ENCOUNTER — Inpatient Hospital Stay
Admission: EM | Admit: 2015-03-23 | Discharge: 2015-03-28 | DRG: 841 | Disposition: A | Discharge status: Skilled Nursing Facility | Payer: Medicare Other | Attending: Internal Medicine | Admitting: Internal Medicine

## 2015-03-23 ENCOUNTER — Emergency Department: Payer: Medicare Other

## 2015-03-23 DIAGNOSIS — M542 Cervicalgia: Principal | ICD-10-CM | POA: Diagnosis present

## 2015-03-23 DIAGNOSIS — J069 Acute upper respiratory infection, unspecified: Secondary | ICD-10-CM | POA: Diagnosis present

## 2015-03-23 DIAGNOSIS — I776 Arteritis, unspecified: Secondary | ICD-10-CM | POA: Diagnosis present

## 2015-03-23 DIAGNOSIS — E785 Hyperlipidemia, unspecified: Secondary | ICD-10-CM | POA: Diagnosis present

## 2015-03-23 DIAGNOSIS — D630 Anemia in neoplastic disease: Secondary | ICD-10-CM | POA: Diagnosis present

## 2015-03-23 DIAGNOSIS — Z79899 Other long term (current) drug therapy: Secondary | ICD-10-CM

## 2015-03-23 DIAGNOSIS — M62838 Other muscle spasm: Secondary | ICD-10-CM | POA: Diagnosis present

## 2015-03-23 DIAGNOSIS — M1990 Unspecified osteoarthritis, unspecified site: Secondary | ICD-10-CM | POA: Diagnosis present

## 2015-03-23 DIAGNOSIS — D631 Anemia in chronic kidney disease: Secondary | ICD-10-CM | POA: Diagnosis present

## 2015-03-23 DIAGNOSIS — Z87891 Personal history of nicotine dependence: Secondary | ICD-10-CM

## 2015-03-23 DIAGNOSIS — N183 Chronic kidney disease, stage 3 (moderate): Secondary | ICD-10-CM | POA: Diagnosis present

## 2015-03-23 DIAGNOSIS — R52 Pain, unspecified: Secondary | ICD-10-CM

## 2015-03-23 DIAGNOSIS — I5032 Chronic diastolic (congestive) heart failure: Secondary | ICD-10-CM | POA: Diagnosis present

## 2015-03-23 DIAGNOSIS — M25512 Pain in left shoulder: Secondary | ICD-10-CM | POA: Diagnosis present

## 2015-03-23 DIAGNOSIS — K219 Gastro-esophageal reflux disease without esophagitis: Secondary | ICD-10-CM | POA: Diagnosis present

## 2015-03-23 DIAGNOSIS — Y929 Unspecified place or not applicable: Secondary | ICD-10-CM

## 2015-03-23 DIAGNOSIS — W010XXA Fall on same level from slipping, tripping and stumbling without subsequent striking against object, initial encounter: Secondary | ICD-10-CM | POA: Diagnosis present

## 2015-03-23 DIAGNOSIS — Z8673 Personal history of transient ischemic attack (TIA), and cerebral infarction without residual deficits: Secondary | ICD-10-CM

## 2015-03-23 DIAGNOSIS — R531 Weakness: Secondary | ICD-10-CM | POA: Diagnosis present

## 2015-03-23 DIAGNOSIS — Z66 Do not resuscitate: Secondary | ICD-10-CM | POA: Diagnosis present

## 2015-03-23 DIAGNOSIS — Z7982 Long term (current) use of aspirin: Secondary | ICD-10-CM

## 2015-03-23 DIAGNOSIS — C9310 Chronic myelomonocytic leukemia not having achieved remission: Secondary | ICD-10-CM | POA: Diagnosis present

## 2015-03-23 DIAGNOSIS — N179 Acute kidney failure, unspecified: Secondary | ICD-10-CM | POA: Diagnosis present

## 2015-03-23 DIAGNOSIS — R000 Tachycardia, unspecified: Secondary | ICD-10-CM | POA: Diagnosis present

## 2015-03-23 DIAGNOSIS — Z96653 Presence of artificial knee joint, bilateral: Secondary | ICD-10-CM | POA: Diagnosis present

## 2015-03-23 DIAGNOSIS — W19XXXA Unspecified fall, initial encounter: Secondary | ICD-10-CM | POA: Diagnosis present

## 2015-03-23 DIAGNOSIS — Z77090 Contact with and (suspected) exposure to asbestos: Secondary | ICD-10-CM | POA: Diagnosis present

## 2015-03-23 DIAGNOSIS — I2510 Atherosclerotic heart disease of native coronary artery without angina pectoris: Secondary | ICD-10-CM | POA: Diagnosis present

## 2015-03-23 DIAGNOSIS — I712 Thoracic aortic aneurysm, without rupture: Secondary | ICD-10-CM | POA: Diagnosis present

## 2015-03-23 DIAGNOSIS — N400 Enlarged prostate without lower urinary tract symptoms: Secondary | ICD-10-CM | POA: Diagnosis present

## 2015-03-23 HISTORY — DX: Chronic myeloid leukemia, BCR/ABL-positive, not having achieved remission: C92.10

## 2015-03-23 NOTE — ED Provider Notes (Signed)
[facility] Emergency Department Provider Note  ____________________________________________  Time seen: 11:15PM  I have reviewed the triage vital signs and the nursing notes.   HISTORY  Chief Complaint Fall      HPI Lucas Newman is a 81 y.o. male presents with history of "sliding out of his chair today onto the floor. Patient complains of left shoulder pain as well as mid to low back pain. Patient denies any loss of consciousness no head injury. Of note patient admits to cough fever intermittently and congestion for approximately 1 month. Patient was seen in the emergency department on 03/20/2015 for the same And diagnosed with cough and peripheral edema. In addition patient also admits to intermittent moderate chest pain that is central and nonradiating.    Past Medical History  Diagnosis Date  . Hyperlipidemia   . GERD (gastroesophageal reflux disease)   . Arthritis   . CVA (cerebral infarction)     Patient Active Problem List   Diagnosis Date Noted  . Cough 05/11/2014  . Shortness of breath 05/11/2014  . Temporal arteritis ([place]) 05/11/2014    Past Surgical History  Procedure Laterality Date  . Appendectomy    . Replacement total knee bilateral  2002/2004  . Carotid endarterectomy Left     Current Outpatient Rx  Name  Route  Sig  Dispense  Refill  . acetaminophen (TYLENOL) 650 MG CR tablet   Oral   Take 650 mg by mouth every 4 (four) hours as needed for pain.          . Calcium Carb-Cholecalciferol (CALCIUM 600+D) 600-800 MG-UNIT TABS   Oral   Take 1 tablet by mouth daily.         Marland Kitchen dipyridamole-aspirin (AGGRENOX) 200-25 MG per 12 hr capsule   Oral   Take 1 capsule by mouth 2 (two) times daily.          . finasteride (PROSCAR) 5 MG tablet   Oral   Take 5 mg by mouth daily.         . fluticasone (FLONASE) 50 MCG/ACT nasal spray   Each Nare   Place 1 spray into both nostrils 2 (two) times daily as needed for  rhinitis.         Marland Kitchen loratadine (CLARITIN) 10 MG tablet   Oral   Take 10 mg by mouth daily.         . Magnesium 250 MG TABS   Oral   Take 250 mg by mouth at bedtime.          . Multiple Minerals-Vitamins (CALCIUM-MAGNESIUM-ZINC-D3 PO)   Oral   Take 1 tablet by mouth daily.         . Multiple Vitamin (MULTIVITAMIN WITH MINERALS) TABS tablet   Oral   Take 1 tablet by mouth daily.         Marland Kitchen omeprazole (PRILOSEC) 20 MG capsule   Oral   Take 20 mg by mouth 2 (two) times daily.          . predniSONE (DELTASONE) 5 MG tablet   Oral   Take 2.5 mg by mouth daily.          Marland Kitchen senna-docusate (SENOKOT-S) 8.6-50 MG tablet   Oral   Take 1 tablet by mouth at bedtime.         . simvastatin (ZOCOR) 20 MG tablet   Oral   Take 20 mg by mouth at bedtime.          Marland Kitchen  tamsulosin (FLOMAX) 0.4 MG CAPS capsule   Oral   Take 0.4 mg by mouth at bedtime.            Allergies No known drug allergies  Family History  Problem Relation Age of Onset  . Lung cancer Brother   . Diabetes type II Mother   . Hypertension Father     Social History Social History  Substance Use Topics  . Smoking status: Former Smoker -- 0.00 packs/day for 20 years    Quit date: 05/11/1984  . Smokeless tobacco: Never Used  . Alcohol Use: No     Comment: occasional beer/bloody mary    Review of Systems  Constitutional: Negative for fever. Eyes: Negative for visual changes. ENT: Negative for sore throat. Cardiovascular: Negative for chest pain. Respiratory: Positive for cough, dyspnea Gastrointestinal: Negative for abdominal pain, vomiting and diarrhea. Genitourinary: Negative for dysuria. Musculoskeletal: Negative for back pain. Skin: Negative for rash. Neurological: Negative for headaches, focal weakness or numbness.   10-point ROS otherwise negative.  ____________________________________________   PHYSICAL EXAM:  VITAL SIGNS: ED Triage Vitals  Enc Vitals Group     BP --       Pulse --      Resp --      Temp --      Temp src --      SpO2 --      Weight --      Height --      Head Cir --      Peak Flow --      Pain Score 03/23/15 2150 10     Pain Loc --      Pain Edu? --      Excl. in [place]? --      Constitutional: Alert and oriented. Well appearing and in no distress. Eyes: Conjunctivae are normal. PERRL. Normal extraocular movements. ENT   Head: Normocephalic and atraumatic.   Nose: No congestion/rhinnorhea.   Mouth/Throat: Mucous membranes are moist.   Neck: No stridor. Hematological/Lymphatic/Immunilogical: No cervical lymphadenopathy. Cardiovascular: Normal rate, regular rhythm. Normal and symmetric distal pulses are present in all extremities. No murmurs, rubs, or gallops. Respiratory: Normal respiratory effort without tachypnea nor retractions. Breath sounds are clear and equal bilaterally. Positive for bibasilar rales and rhonchi Gastrointestinal: Soft and nontender. No distention. There is no CVA tenderness. Genitourinary: deferred Musculoskeletal: Nontender with normal range of motion in all extremities. No joint effusions.  No lower extremity tenderness nor edema. Neurologic:  Normal speech and language. No gross focal neurologic deficits are appreciated. Speech is normal.  Skin:  Skin is warm, dry and intact. No rash noted. Psychiatric: Mood and affect are normal. Speech and behavior are normal. Patient exhibits appropriate insight and judgment.  ____________________________________________    LABS (pertinent positives/negatives)  Labs Reviewed  CBC WITH DIFFERENTIAL/PLATELET - Abnormal; Notable for the following:    WBC 32.8 (*)    RBC 2.98 (*)    Hemoglobin 8.7 (*)    HCT 27.8 (*)    MCHC 31.4 (*)    RDW 14.8 (*)    Neutro Abs 14.8 (*)    Monocytes Absolute 17.0 (*)    All other components within normal limits  COMPREHENSIVE METABOLIC PANEL - Abnormal; Notable for the following:    Glucose, Bld 147 (*)    BUN 28  (*)    Creatinine, Ser 1.84 (*)    Albumin 3.2 (*)    ALT 10 (*)    GFR calc non Af Amer 30 (*)  GFR calc Af Amer 35 (*)    All other components within normal limits  URINALYSIS COMPLETEWITH MICROSCOPIC ([facility] ONLY) - Abnormal; Notable for the following:    Color, Urine YELLOW (*)    APPearance CLEAR (*)    Ketones, ur TRACE (*)    Hgb urine dipstick 1+ (*)    Protein, ur 30 (*)    Bacteria, UA RARE (*)    Squamous Epithelial / LPF 0-5 (*)    All other components within normal limits  TROPONIN I  ETHANOL  TSH  TROPONIN I  TROPONIN I  TROPONIN I     ____________________________________________   EKG  ED ECG REPORT I, Mykia Holton, [place], the attending physician, personally viewed and interpreted this ECG.   Date: 03/24/2015  EKG Time: 9:55 PM  Rate: 112  Rhythm: Atrial fibrillation with rapid ventricular response  Axis: Right  Intervals: irregular PR interval  ST&T Change: None   ____________________________________________    RADIOLOGY  DG Thoracic Spine 2 View (Final result) Result time: 03/24/15 00:32:56   Final result by Rad Results In Interface (03/24/15 00:32:56)   Narrative:   CLINICAL DATA: Status post fall to floor from chair. Upper back pain. Initial encounter.  EXAM: THORACIC SPINE 2 VIEWS  COMPARISON: CT of the chest performed earlier today at 12:07 a.m.  FINDINGS: There is no evidence of fracture or subluxation. Vertebral bodies demonstrate normal height and alignment. Intervertebral disc spaces are preserved. Anterior bridging osteophytes are noted along the thoracic spine.  The visualized portions of both lungs are clear. The mediastinum is unremarkable in appearance.  IMPRESSION: No evidence of fracture or subluxation along the thoracic spine.   Electronically Signed By: Garald Balding M.D. On: 03/24/2015 00:32          DG Lumbar Spine 2-3 Views (Final result) Result time: 03/24/15 00:35:41   Final result by Rad  Results In Interface (03/24/15 00:35:41)   Narrative:   CLINICAL DATA: Status post fall to floor from chair. Lower back pain. Initial encounter.  EXAM: LUMBAR SPINE - 2-3 VIEW  COMPARISON: CT of the abdomen and pelvis performed 04/07/2010  FINDINGS: There is no evidence of fracture or subluxation. The anterior and lateral osteophytes are noted along the lumbar spine. Vertebral bodies demonstrate normal height and alignment. Multilevel disc space narrowing is noted along the lumbar spine.  The visualized bowel gas pattern is unremarkable in appearance; air and stool are noted within the colon. Sclerotic change is noted at the sacroiliac joints. Scattered vascular calcifications are seen.  IMPRESSION: 1. No evidence of fracture or subluxation along the lumbar spine. 2. Mild diffuse degenerative change along the lumbar spine. 3. Scattered vascular calcifications seen.   Electronically Signed By: Garald Balding M.D. On: 03/24/2015 00:35          CT Head Wo Contrast (Final result) Result time: 03/24/15 00:30:52   Final result by Rad Results In Interface (03/24/15 00:30:52)   Narrative:   CLINICAL DATA: Status post fall. Slid out of chair onto floor. Generalized weakness for several weeks. Concern for chest or cervical spine injury. Initial encounter.  EXAM: CT HEAD WITHOUT CONTRAST  CT CERVICAL SPINE WITHOUT CONTRAST  TECHNIQUE: Multidetector CT imaging of the head and cervical spine was performed following the standard protocol without intravenous contrast. Multiplanar CT image reconstructions of the cervical spine were also generated.  COMPARISON: MRI of the brain performed 12/23/2013, and CT of the head performed 03/16/2010.  FINDINGS: CT HEAD FINDINGS  There is no evidence of  acute infarction, mass lesion, or intra- or extra-axial hemorrhage on CT.  Prominence of the ventricles and sulci reflects moderate cortical volume loss. Cerebellar  atrophy is noted. Mild periventricular and subcortical white matter change likely reflects small vessel ischemic microangiopathy.  The brainstem and fourth ventricle are within normal limits. The basal ganglia are unremarkable in appearance. The cerebral hemispheres demonstrate grossly normal gray-white differentiation. No mass effect or midline shift is seen.  There is no evidence of fracture; visualized osseous structures are unremarkable in appearance. The orbits are within normal limits. There is near complete opacification of the right side of the sphenoid sinus and right frontal sinus. The remaining paranasal sinuses and mastoid air cells are well-aerated. No significant soft tissue abnormalities are seen.  CT CERVICAL SPINE FINDINGS  There is no evidence of fracture or subluxation. Vertebral bodies demonstrate normal height. Multilevel disc space narrowing is noted along the cervical spine, with scattered anterior and posterior disc osteophyte complexes. There is minimal grade 1 anterolisthesis of C7 on T1, reflecting underlying facet disease. Prevertebral soft tissues are within normal limits.  The thyroid gland is unremarkable in appearance. The visualized lung apices are clear. Calcification is noted at the left carotid bifurcation. Postoperative change is noted about the right common and internal carotid artery.  IMPRESSION: 1. No evidence of traumatic intracranial injury or fracture. 2. No evidence of fracture or subluxation along the cervical spine. 3. Moderate cortical volume loss and scattered small vessel ischemic microangiopathy. 4. Mild degenerative change noted along the cervical spine. 5. Near complete opacification of the right side of the sphenoid sinus and the right frontal sinus. 6. Calcification at the left carotid bifurcation.   Electronically Signed By: Garald Balding M.D. On: 03/24/2015 00:30          CT Chest Wo Contrast (Final  result) Result time: 03/24/15 00:34:58   Final result by Rad Results In Interface (03/24/15 00:34:58)   Narrative:   CLINICAL DATA: Post fall, slipped out of chair onto floor. Weakness. Cough, fever and dyspnea.  EXAM: CT CHEST WITHOUT CONTRAST  TECHNIQUE: Multidetector CT imaging of the chest was performed following the standard protocol without IV contrast.  COMPARISON: Chest radiographs 03/20/2015  FINDINGS: Multi chamber cardiomegaly. There are mitral annulus calcifications. Coronary artery calcifications are seen. Thoracic aorta is ectatic with mild aneurysmal dilatation of the ascending aorta, 4.2 cm. There is moderate atherosclerotic calcification. No periaortic soft tissue stranding. No mediastinal or evident hilar adenopathy. No pericardial effusion.  Diminutive bilateral pleural effusions, left greater than right. Multifocal small pleural based calcifications bilaterally. No consolidation to suggest pneumonia. No pneumothorax or pneumomediastinum. No suspicious pulmonary nodule or mass. No pulmonary edema.  Multilevel degenerative change in the thoracic spine without acute fracture or subluxation. No acute rib fracture. Sternum is intact.  No acute abnormality in the included upper abdomen.  IMPRESSION: 1. No definite acute abnormality. There are trace bilateral pleural effusions. 2. Multi chamber cardiomegaly with coronary artery calcifications. Ectatic thoracic aorta with mild aneurysmal dilatation of the ascending portion measuring up to 4.2 cm. Recommend annual imaging followup by CTA or MRA. This recommendation follows 2010 ACCF/AHA/AATS/ACR/ASA/SCA/SCAI/SIR/STS/SVM Guidelines for the Diagnosis and Management of Patients with Thoracic Aortic Disease. Circulation. 2010; 121: [id number] 3. Multifocal small pleural based calcifications, may reflect pleural plaques and sequela of prior asbestos exposure.   Electronically Signed By: Jeb Levering  M.D. On: 03/24/2015 00:34          CT Cervical Spine Wo Contrast (Final result) Result time: 03/24/15 00:30:52  Final result by Rad Results In Interface (03/24/15 00:30:52)   Narrative:   CLINICAL DATA: Status post fall. Slid out of chair onto floor. Generalized weakness for several weeks. Concern for chest or cervical spine injury. Initial encounter.  EXAM: CT HEAD WITHOUT CONTRAST  CT CERVICAL SPINE WITHOUT CONTRAST  TECHNIQUE: Multidetector CT imaging of the head and cervical spine was performed following the standard protocol without intravenous contrast. Multiplanar CT image reconstructions of the cervical spine were also generated.  COMPARISON: MRI of the brain performed 12/23/2013, and CT of the head performed 03/16/2010.  FINDINGS: CT HEAD FINDINGS  There is no evidence of acute infarction, mass lesion, or intra- or extra-axial hemorrhage on CT.  Prominence of the ventricles and sulci reflects moderate cortical volume loss. Cerebellar atrophy is noted. Mild periventricular and subcortical white matter change likely reflects small vessel ischemic microangiopathy.  The brainstem and fourth ventricle are within normal limits. The basal ganglia are unremarkable in appearance. The cerebral hemispheres demonstrate grossly normal gray-white differentiation. No mass effect or midline shift is seen.  There is no evidence of fracture; visualized osseous structures are unremarkable in appearance. The orbits are within normal limits. There is near complete opacification of the right side of the sphenoid sinus and right frontal sinus. The remaining paranasal sinuses and mastoid air cells are well-aerated. No significant soft tissue abnormalities are seen.  CT CERVICAL SPINE FINDINGS  There is no evidence of fracture or subluxation. Vertebral bodies demonstrate normal height. Multilevel disc space narrowing is noted along the cervical spine, with scattered  anterior and posterior disc osteophyte complexes. There is minimal grade 1 anterolisthesis of C7 on T1, reflecting underlying facet disease. Prevertebral soft tissues are within normal limits.  The thyroid gland is unremarkable in appearance. The visualized lung apices are clear. Calcification is noted at the left carotid bifurcation. Postoperative change is noted about the right common and internal carotid artery.  IMPRESSION: 1. No evidence of traumatic intracranial injury or fracture. 2. No evidence of fracture or subluxation along the cervical spine. 3. Moderate cortical volume loss and scattered small vessel ischemic microangiopathy. 4. Mild degenerative change noted along the cervical spine. 5. Near complete opacification of the right side of the sphenoid sinus and the right frontal sinus. 6. Calcification at the left carotid bifurcation.   Electronically Signed By: Garald Balding M.D. On: 03/24/2015 00:30     INITIAL IMPRESSION / ASSESSMENT AND PLAN / ED COURSE  Pertinent labs & imaging results that were available during my care of the patient were reviewed by me and considered in my medical decision making (see chart for details).  During evaluating the patient she intermittently would grab his chest and discomfort which he states has been occurring from on and off for a few days"  ____________________________________________   FINAL CLINICAL IMPRESSION(S) / ED DIAGNOSES  Final diagnoses:  None  Chest pain    Gregor Hams, MD 03/24/15 [phone]

## 2015-03-23 NOTE — ED Notes (Signed)
Per family, pt has had back/shoulder pain for past couple days, has had worsening weakness/fatique for three weeks since upper respiratory disease. Lives with wife who is currently in rehab.

## 2015-03-23 NOTE — ED Notes (Signed)
Pt transported to CT ?

## 2015-03-23 NOTE — ED Notes (Addendum)
Pt arrived via [facility] with C/O fall, pt states he slid out of chair onto the floor. Pt states has had weakness for a couple weeks.

## 2015-03-24 ENCOUNTER — Observation Stay
Admit: 2015-03-24 | Discharge: 2015-03-24 | Disposition: A | Discharge status: Home / Self Care | Payer: Medicare Other | Attending: Internal Medicine | Admitting: Internal Medicine

## 2015-03-24 ENCOUNTER — Other Ambulatory Visit: Payer: PRIVATE HEALTH INSURANCE

## 2015-03-24 ENCOUNTER — Emergency Department: Payer: Medicare Other

## 2015-03-24 ENCOUNTER — Encounter: Payer: Self-pay | Admitting: Internal Medicine

## 2015-03-24 DIAGNOSIS — W19XXXA Unspecified fall, initial encounter: Secondary | ICD-10-CM | POA: Diagnosis present

## 2015-03-24 LAB — CBC WITH DIFFERENTIAL/PLATELET
Basophils Absolute: 0 10*3/uL (ref 0–0.1)
Basophils Relative: 0 %
EOS PCT: 0 %
Eosinophils Absolute: 0 10*3/uL (ref 0–0.7)
HCT: 27.8 % — ABNORMAL LOW (ref 40.0–52.0)
Hemoglobin: 8.7 g/dL — ABNORMAL LOW (ref 13.0–18.0)
LYMPHS ABS: 1 10*3/uL (ref 1.0–3.6)
Lymphocytes Relative: 3 %
MCH: 29.3 pg (ref 26.0–34.0)
MCHC: 31.4 g/dL — ABNORMAL LOW (ref 32.0–36.0)
MCV: 93.3 fL (ref 80.0–100.0)
MONO ABS: 17 10*3/uL — AB (ref 0.2–1.0)
Monocytes Relative: 52 %
NEUTROS ABS: 14.8 10*3/uL — AB (ref 1.4–6.5)
Neutrophils Relative %: 45 %
PLATELETS: 198 10*3/uL (ref 150–440)
RBC: 2.98 MIL/uL — AB (ref 4.40–5.90)
RDW: 14.8 % — AB (ref 11.5–14.5)
WBC: 32.8 10*3/uL — AB (ref 3.8–10.6)

## 2015-03-24 LAB — COMPREHENSIVE METABOLIC PANEL
ALT: 10 U/L — AB (ref 17–63)
ANION GAP: 12 (ref 5–15)
AST: 16 U/L (ref 15–41)
Albumin: 3.2 g/dL — ABNORMAL LOW (ref 3.5–5.0)
Alkaline Phosphatase: 50 U/L (ref 38–126)
BUN: 28 mg/dL — AB (ref 6–20)
CALCIUM: 8.9 mg/dL (ref 8.9–10.3)
CHLORIDE: 107 mmol/L (ref 101–111)
CO2: 23 mmol/L (ref 22–32)
CREATININE: 1.84 mg/dL — AB (ref 0.61–1.24)
GFR, EST AFRICAN AMERICAN: 35 mL/min — AB (ref >60)
GFR, EST NON AFRICAN AMERICAN: 30 mL/min — AB (ref >60)
Glucose, Bld: 147 mg/dL — ABNORMAL HIGH (ref 65–99)
POTASSIUM: 4.1 mmol/L (ref 3.5–5.1)
SODIUM: 142 mmol/L (ref 135–145)
Total Bilirubin: 0.5 mg/dL (ref 0.3–1.2)
Total Protein: 7.2 g/dL (ref 6.5–8.1)

## 2015-03-24 LAB — URINALYSIS COMPLETE WITH MICROSCOPIC (ARMC ONLY)
Bilirubin Urine: NEGATIVE
GLUCOSE, UA: NEGATIVE mg/dL
Leukocytes, UA: NEGATIVE
Nitrite: NEGATIVE
PH: 5 (ref 5.0–8.0)
Protein, ur: 30 mg/dL — AB
Specific Gravity, Urine: 1.014 (ref 1.005–1.030)

## 2015-03-24 LAB — TROPONIN I
Troponin I: <0.03 ng/mL (ref <0.031)
Troponin I: 0.03 ng/mL (ref <0.031)
Troponin I: <0.03 ng/mL (ref <0.031)

## 2015-03-24 LAB — TSH: TSH: 1.509 u[IU]/mL (ref 0.350–4.500)

## 2015-03-24 LAB — ETHANOL

## 2015-03-24 MED ORDER — DIPHENHYDRAMINE HCL 25 MG PO CAPS
25.0000 mg | ORAL_CAPSULE | Freq: Every evening | ORAL | Status: DC | PRN
  Administered 2015-03-24 – 2015-03-25 (×2): 25 mg via ORAL
  Filled 2015-03-24 (×2): qty 1

## 2015-03-24 MED ORDER — HEPARIN SODIUM (PORCINE) 5000 UNIT/ML IJ SOLN
5000.0000 [IU] | Freq: Three times a day (TID) | INTRAMUSCULAR | Status: DC
  Administered 2015-03-24 – 2015-03-27 (×9): 5000 [IU] via SUBCUTANEOUS
  Filled 2015-03-24 (×9): qty 1

## 2015-03-24 MED ORDER — CALCIUM CARB-CHOLECALCIFEROL 600-800 MG-UNIT PO TABS: 1.0000 | ORAL_TABLET | Freq: Every day | ORAL | Status: DC

## 2015-03-24 MED ORDER — GUAIFENESIN 100 MG/5ML PO SOLN
10.0000 mL | ORAL | Status: DC | PRN
  Administered 2015-03-24 – 2015-03-26 (×7): 200 mg via ORAL
  Filled 2015-03-24 (×7): qty 10

## 2015-03-24 MED ORDER — PREDNISONE 1 MG PO TABS
2.5000 mg | ORAL_TABLET | Freq: Every day | ORAL | Status: DC
  Administered 2015-03-24 – 2015-03-25 (×2): 2.5 mg via ORAL
  Filled 2015-03-24 (×2): qty 3

## 2015-03-24 MED ORDER — PANTOPRAZOLE SODIUM 40 MG PO TBEC
40.0000 mg | DELAYED_RELEASE_TABLET | Freq: Every day | ORAL | Status: DC
  Administered 2015-03-24 – 2015-03-28 (×6): 40 mg via ORAL
  Filled 2015-03-24 (×6): qty 1

## 2015-03-24 MED ORDER — CARVEDILOL 3.125 MG PO TABS
6.2500 mg | ORAL_TABLET | Freq: Two times a day (BID) | ORAL | Status: DC
  Administered 2015-03-24 – 2015-03-28 (×8): 6.25 mg via ORAL
  Filled 2015-03-24 (×8): qty 2

## 2015-03-24 MED ORDER — LORATADINE 10 MG PO TABS
10.0000 mg | ORAL_TABLET | Freq: Every day | ORAL | Status: DC
  Administered 2015-03-24 – 2015-03-28 (×5): 10 mg via ORAL
  Filled 2015-03-24 (×5): qty 1

## 2015-03-24 MED ORDER — ONDANSETRON HCL 4 MG/2ML IJ SOLN: 4.0000 mg | Freq: Four times a day (QID) | INTRAMUSCULAR | Status: DC | PRN

## 2015-03-24 MED ORDER — ONDANSETRON HCL 4 MG PO TABS: 4.0000 mg | ORAL_TABLET | Freq: Four times a day (QID) | ORAL | Status: DC | PRN

## 2015-03-24 MED ORDER — ACETAMINOPHEN 650 MG RE SUPP: 650.0000 mg | Freq: Four times a day (QID) | RECTAL | Status: DC | PRN

## 2015-03-24 MED ORDER — DOCUSATE SODIUM 100 MG PO CAPS
100.0000 mg | ORAL_CAPSULE | Freq: Two times a day (BID) | ORAL | Status: DC
  Administered 2015-03-24 – 2015-03-28 (×8): 100 mg via ORAL
  Filled 2015-03-24 (×8): qty 1

## 2015-03-24 MED ORDER — MAGNESIUM OXIDE 400 (241.3 MG) MG PO TABS
400.0000 mg | ORAL_TABLET | Freq: Every day | ORAL | Status: DC
  Administered 2015-03-24 – 2015-03-28 (×5): 400 mg via ORAL
  Filled 2015-03-24 (×5): qty 1

## 2015-03-24 MED ORDER — LEVOFLOXACIN 500 MG PO TABS
250.0000 mg | ORAL_TABLET | Freq: Every day | ORAL | Status: DC
  Administered 2015-03-25 – 2015-03-26 (×2): 250 mg via ORAL
  Filled 2015-03-24 (×2): qty 1

## 2015-03-24 MED ORDER — ADULT MULTIVITAMIN W/MINERALS CH
1.0000 | ORAL_TABLET | Freq: Every day | ORAL | Status: DC
Start: 2015-03-24 — End: 2015-03-28
  Administered 2015-03-24 – 2015-03-28 (×5): 1 via ORAL
  Filled 2015-03-24 (×5): qty 1

## 2015-03-24 MED ORDER — ZINC SULFATE 220 (50 ZN) MG PO CAPS
220.0000 mg | ORAL_CAPSULE | Freq: Every day | ORAL | Status: DC
  Administered 2015-03-24 – 2015-03-28 (×5): 220 mg via ORAL
  Filled 2015-03-24 (×5): qty 1

## 2015-03-24 MED ORDER — SENNOSIDES-DOCUSATE SODIUM 8.6-50 MG PO TABS
1.0000 | ORAL_TABLET | Freq: Every day | ORAL | Status: DC
  Administered 2015-03-24 – 2015-03-27 (×4): 1 via ORAL
  Filled 2015-03-24 (×4): qty 1

## 2015-03-24 MED ORDER — ONDANSETRON HCL 4 MG/2ML IJ SOLN
4.0000 mg | Freq: Once | INTRAMUSCULAR | Status: AC
  Administered 2015-03-24: 4 mg via INTRAVENOUS
  Filled 2015-03-24: qty 2

## 2015-03-24 MED ORDER — TAMSULOSIN HCL 0.4 MG PO CAPS
0.4000 mg | ORAL_CAPSULE | Freq: Every day | ORAL | Status: DC
  Administered 2015-03-24 – 2015-03-27 (×4): 0.4 mg via ORAL
  Filled 2015-03-24 (×4): qty 1

## 2015-03-24 MED ORDER — CARVEDILOL 6.25 MG PO TABS
3.1250 mg | ORAL_TABLET | Freq: Once | ORAL | Status: AC
  Administered 2015-03-24: 04:00:00 via ORAL
  Filled 2015-03-24: qty 1

## 2015-03-24 MED ORDER — MORPHINE SULFATE (PF) 2 MG/ML IV SOLN
1.0000 mg | INTRAVENOUS | Status: DC | PRN
  Administered 2015-03-24 – 2015-03-26 (×10): 1 mg via INTRAVENOUS
  Filled 2015-03-24 (×9): qty 1

## 2015-03-24 MED ORDER — ACETAMINOPHEN 325 MG PO TABS
650.0000 mg | ORAL_TABLET | Freq: Four times a day (QID) | ORAL | Status: DC | PRN
  Administered 2015-03-28: 09:00:00 650 mg via ORAL
  Filled 2015-03-24: qty 2

## 2015-03-24 MED ORDER — CALCIUM-MAGNESIUM-ZINC-D3 PO TABS: 1.0000 | ORAL_TABLET | Freq: Every day | ORAL | Status: DC

## 2015-03-24 MED ORDER — MORPHINE SULFATE (PF) 2 MG/ML IV SOLN
2.0000 mg | Freq: Once | INTRAVENOUS | Status: AC
  Administered 2015-03-24: 2 mg via INTRAVENOUS
  Filled 2015-03-24: qty 1

## 2015-03-24 MED ORDER — SIMVASTATIN 20 MG PO TABS
20.0000 mg | ORAL_TABLET | Freq: Every day | ORAL | Status: DC
  Administered 2015-03-24 – 2015-03-27 (×4): 20 mg via ORAL
  Filled 2015-03-24 (×4): qty 1

## 2015-03-24 MED ORDER — ASPIRIN-DIPYRIDAMOLE ER 25-200 MG PO CP12
1.0000 | ORAL_CAPSULE | Freq: Two times a day (BID) | ORAL | Status: DC
Start: 2015-03-24 — End: 2015-03-28
  Administered 2015-03-24 – 2015-03-28 (×9): 1 via ORAL
  Filled 2015-03-24 (×10): qty 1

## 2015-03-24 MED ORDER — CALCIUM CARBONATE-VITAMIN D 500-200 MG-UNIT PO TABS
1.0000 | ORAL_TABLET | Freq: Every day | ORAL | Status: DC
  Administered 2015-03-24 – 2015-03-28 (×5): 1 via ORAL
  Filled 2015-03-24 (×5): qty 1

## 2015-03-24 MED ORDER — SODIUM CHLORIDE 0.9 % IJ SOLN
3.0000 mL | Freq: Two times a day (BID) | INTRAMUSCULAR | Status: DC
  Administered 2015-03-24 – 2015-03-28 (×5): 3 mL via INTRAVENOUS

## 2015-03-24 MED ORDER — LEVOFLOXACIN 500 MG PO TABS
500.0000 mg | ORAL_TABLET | Freq: Every day | ORAL | Status: DC
  Administered 2015-03-24: 18:00:00 500 mg via ORAL
  Filled 2015-03-24: qty 1

## 2015-03-24 MED ORDER — MAGNESIUM 250 MG PO TABS: 250.0000 mg | ORAL_TABLET | Freq: Every day | ORAL | Status: DC

## 2015-03-24 MED ORDER — FLUTICASONE PROPIONATE 50 MCG/ACT NA SUSP
1.0000 | Freq: Two times a day (BID) | NASAL | Status: DC | PRN
  Administered 2015-03-28: 1 via NASAL
  Filled 2015-03-24: qty 16

## 2015-03-24 MED ORDER — FINASTERIDE 5 MG PO TABS
5.0000 mg | ORAL_TABLET | Freq: Every day | ORAL | Status: DC
  Administered 2015-03-24 – 2015-03-28 (×5): 5 mg via ORAL
  Filled 2015-03-24 (×5): qty 1

## 2015-03-24 NOTE — ED Notes (Signed)
Family member Lucas Newman left contact information Cell--[phone]

## 2015-03-24 NOTE — Plan of Care (Signed)
Problem: Skin Integrity: Goal: Risk for impaired skin integrity will decrease Outcome: Progressing Plan of Care Progress to Goal:   Pt report neck pain once during shift. Pt has been resting comfortably the rest of the shift. No other signs of distress noted. Will continue to monitor.

## 2015-03-24 NOTE — ED Notes (Signed)
Pt returned from CT °

## 2015-03-24 NOTE — Care Management (Signed)
Message left for Lucas Newman at [facility].  [facility] packet faxed to [phone].  Patient consents to transfer

## 2015-03-24 NOTE — Progress Notes (Signed)
Renal Adjustment: Patient started on Levaquin 500mg  PO daily to continue home therapy. Estimated CrCl is 25.60ml/min. Per policy will renally adjust to Levaquin 500mg  PO once followed by Levaquin 250mg  Po daily.  Paulina Fusi, PharmD, BCPS 03/24/2015 9:03 PM

## 2015-03-24 NOTE — Plan of Care (Signed)
Pt admitted post fall w/neck and shoulder pain.  CT shows no fractures.  Morphine given 2x.  Troponins negative.  Pt and family would like pt to go to SNF and PT also recommends; but pt was admitted under obs and doesn't qualify this way.  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 Pt has severe dry cough - may be cause of chest pain.  Was on po abx for bronchitis - zpack.  Didn't work.  He went to dr Duard Brady and was put on levaquin.  Dr Volanda Napoleon restarted that today.  WBC elevated b/c of leukemia.

## 2015-03-24 NOTE — H&P (Signed)
Lucas Newman is an 81 y.o. male.   Chief Complaint: fall HPI: The patient presents emergency department after suffering a mechanical fall.the patient states that his floors are slick and that he got up to use the bathroom but slipped in his socks. He fell on his left side and complains of left shoulder and neck pain. After arrival to the hospital the patient mentioned that he had some chest pain. He has had nausea over the last week and some generalized weakness but tonight his chest pain is not associated with either. He denies shortness of breath or diaphoresis. He mentions that his legs have been swelling more than usual. Also survey revealed no fractures or subluxations but due to the patient's generalized weakness and vague report of chest pain the emergency department staff called for admission.  Past Medical History  Diagnosis Date  . Hyperlipidemia   . GERD (gastroesophageal reflux disease)   . Arthritis   . CVA (cerebral infarction)   . CML (chronic myelocytic leukemia) ([place])     Past Surgical History  Procedure Laterality Date  . Appendectomy    . Replacement total knee bilateral  2002/2004  . Carotid endarterectomy Left     Family History  Problem Relation Age of Onset  . Lung cancer Brother   . Diabetes type II Mother   . Hypertension Father    Social History:  reports that he quit smoking about 30 years ago. He has never used smokeless tobacco. He reports that he does not drink alcohol or use illicit drugs.  Allergies: No Known Allergies  Prior to Admission medications   Medication Sig Start Date End Date Taking? Authorizing Provider  acetaminophen (TYLENOL) 650 MG CR tablet Take 650 mg by mouth every 4 (four) hours as needed for pain.     Historical Provider, MD  Calcium Carb-Cholecalciferol (CALCIUM 600+D) 600-800 MG-UNIT TABS Take 1 tablet by mouth daily.    Historical Provider, MD  dipyridamole-aspirin (AGGRENOX) 200-25 MG per 12 hr capsule Take 1 capsule by  mouth 2 (two) times daily.     Historical Provider, MD  finasteride (PROSCAR) 5 MG tablet Take 5 mg by mouth daily.    Historical Provider, MD  fluticasone (FLONASE) 50 MCG/ACT nasal spray Place 1 spray into both nostrils 2 (two) times daily as needed for rhinitis.    Historical Provider, MD  loratadine (CLARITIN) 10 MG tablet Take 10 mg by mouth daily.    Historical Provider, MD  Magnesium 250 MG TABS Take 250 mg by mouth at bedtime.     Historical Provider, MD  Multiple Minerals-Vitamins (CALCIUM-MAGNESIUM-ZINC-D3 PO) Take 1 tablet by mouth daily.    Historical Provider, MD  Multiple Vitamin (MULTIVITAMIN WITH MINERALS) TABS tablet Take 1 tablet by mouth daily.    Historical Provider, MD  omeprazole (PRILOSEC) 20 MG capsule Take 20 mg by mouth 2 (two) times daily.     Historical Provider, MD  predniSONE (DELTASONE) 5 MG tablet Take 2.5 mg by mouth daily.     Historical Provider, MD  senna-docusate (SENOKOT-S) 8.6-50 MG tablet Take 1 tablet by mouth at bedtime.    Historical Provider, MD  simvastatin (ZOCOR) 20 MG tablet Take 20 mg by mouth at bedtime.     Historical Provider, MD  tamsulosin (FLOMAX) 0.4 MG CAPS capsule Take 0.4 mg by mouth at bedtime.     Historical Provider, MD     Results for orders placed or performed during the hospital encounter of 03/23/15 (from the past 48  hour(s))  Urinalysis complete, with microscopic ([facility] only)     Status: Abnormal   Collection Time: 03/23/15 11:00 PM  Result Value Ref Range   Color, Urine YELLOW (A) YELLOW   APPearance CLEAR (A) CLEAR   Glucose, UA NEGATIVE NEGATIVE mg/dL   Bilirubin Urine NEGATIVE NEGATIVE   Ketones, ur TRACE (A) NEGATIVE mg/dL   Specific Gravity, Urine 1.014 1.005 - 1.030   Hgb urine dipstick 1+ (A) NEGATIVE   pH 5.0 5.0 - 8.0   Protein, ur 30 (A) NEGATIVE mg/dL   Nitrite NEGATIVE NEGATIVE   Leukocytes, UA NEGATIVE NEGATIVE   RBC / HPF 0-5 0 - 5 RBC/hpf   WBC, UA 0-5 0 - 5 WBC/hpf   Bacteria, UA RARE (A) NONE SEEN    Squamous Epithelial / LPF 0-5 (A) NONE SEEN   Mucous PRESENT    Hyaline Casts, UA PRESENT   CBC with Differential     Status: Abnormal   Collection Time: 03/23/15 11:10 PM  Result Value Ref Range   WBC 32.8 (H) 3.8 - 10.6 K/uL   RBC 2.98 (L) 4.40 - 5.90 MIL/uL   Hemoglobin 8.7 (L) 13.0 - 18.0 g/dL   HCT 27.8 (L) 40.0 - 52.0 %   MCV 93.3 80.0 - 100.0 fL   MCH 29.3 26.0 - 34.0 pg   MCHC 31.4 (L) 32.0 - 36.0 g/dL   RDW 14.8 (H) 11.5 - 14.5 %   Platelets 198 150 - 440 K/uL   Neutrophils Relative % 45 %   Lymphocytes Relative 3 %   Monocytes Relative 52 %   Eosinophils Relative 0 %   Basophils Relative 0 %   Neutro Abs 14.8 (H) 1.4 - 6.5 K/uL   Lymphs Abs 1.0 1.0 - 3.6 K/uL   Monocytes Absolute 17.0 (H) 0.2 - 1.0 K/uL   Eosinophils Absolute 0.0 0 - 0.7 K/uL   Basophils Absolute 0.0 0 - 0.1 K/uL  Comprehensive metabolic panel     Status: Abnormal   Collection Time: 03/23/15 11:10 PM  Result Value Ref Range   Sodium 142 135 - 145 mmol/L   Potassium 4.1 3.5 - 5.1 mmol/L   Chloride 107 101 - 111 mmol/L   CO2 23 22 - 32 mmol/L   Glucose, Bld 147 (H) 65 - 99 mg/dL   BUN 28 (H) 6 - 20 mg/dL   Creatinine, Ser 1.84 (H) 0.61 - 1.24 mg/dL   Calcium 8.9 8.9 - 10.3 mg/dL   Total Protein 7.2 6.5 - 8.1 g/dL   Albumin 3.2 (L) 3.5 - 5.0 g/dL   AST 16 15 - 41 U/L   ALT 10 (L) 17 - 63 U/L   Alkaline Phosphatase 50 38 - 126 U/L   Total Bilirubin 0.5 0.3 - 1.2 mg/dL   GFR calc non Af Amer 30 (L) >60 mL/min   GFR calc Af Amer 35 (L) >60 mL/min    Comment: (NOTE) The eGFR has been calculated using the CKD EPI equation. This calculation has not been validated in all clinical situations. eGFR's persistently <60 mL/min signify possible Chronic Kidney Disease.    Anion gap 12 5 - 15  Troponin I     Status: None   Collection Time: 03/23/15 11:10 PM  Result Value Ref Range   Troponin I <0.03 <0.031 ng/mL    Comment:        NO INDICATION OF MYOCARDIAL INJURY.   Ethanol     Status: None    Collection Time: 03/23/15 11:10 PM  Result Value Ref Range   Alcohol, Ethyl (B) <5 <5 mg/dL    Comment:        LOWEST DETECTABLE LIMIT FOR SERUM ALCOHOL IS 5 mg/dL FOR MEDICAL PURPOSES ONLY    Dg Thoracic Spine 2 View  03/24/2015  CLINICAL DATA:  Status post fall to floor from chair. Upper back pain. Initial encounter. EXAM: THORACIC SPINE 2 VIEWS COMPARISON:  CT of the chest performed earlier today at 12:07 a.m. FINDINGS: There is no evidence of fracture or subluxation. Vertebral bodies demonstrate normal height and alignment. Intervertebral disc spaces are preserved. Anterior bridging osteophytes are noted along the thoracic spine. The visualized portions of both lungs are clear. The mediastinum is unremarkable in appearance. IMPRESSION: No evidence of fracture or subluxation along the thoracic spine. Electronically Signed   By: Garald Balding M.D.   On: 03/24/2015 00:32   Dg Lumbar Spine 2-3 Views  03/24/2015  CLINICAL DATA:  Status post fall to floor from chair. Lower back pain. Initial encounter. EXAM: LUMBAR SPINE - 2-3 VIEW COMPARISON:  CT of the abdomen and pelvis performed 04/07/2010 FINDINGS: There is no evidence of fracture or subluxation. The anterior and lateral osteophytes are noted along the lumbar spine. Vertebral bodies demonstrate normal height and alignment. Multilevel disc space narrowing is noted along the lumbar spine. The visualized bowel gas pattern is unremarkable in appearance; air and stool are noted within the colon. Sclerotic change is noted at the sacroiliac joints. Scattered vascular calcifications are seen. IMPRESSION: 1. No evidence of fracture or subluxation along the lumbar spine. 2. Mild diffuse degenerative change along the lumbar spine. 3. Scattered vascular calcifications seen. Electronically Signed   By: Garald Balding M.D.   On: 03/24/2015 00:35   Ct Head Wo Contrast  03/24/2015  CLINICAL DATA:  Status post fall. Slid out of chair onto floor. Generalized  weakness for several weeks. Concern for chest or cervical spine injury. Initial encounter. EXAM: CT HEAD WITHOUT CONTRAST CT CERVICAL SPINE WITHOUT CONTRAST TECHNIQUE: Multidetector CT imaging of the head and cervical spine was performed following the standard protocol without intravenous contrast. Multiplanar CT image reconstructions of the cervical spine were also generated. COMPARISON:  MRI of the brain performed 12/23/2013, and CT of the head performed 03/16/2010. FINDINGS: CT HEAD FINDINGS There is no evidence of acute infarction, mass lesion, or intra- or extra-axial hemorrhage on CT. Prominence of the ventricles and sulci reflects moderate cortical volume loss. Cerebellar atrophy is noted. Mild periventricular and subcortical white matter change likely reflects small vessel ischemic microangiopathy. The brainstem and fourth ventricle are within normal limits. The basal ganglia are unremarkable in appearance. The cerebral hemispheres demonstrate grossly normal gray-white differentiation. No mass effect or midline shift is seen. There is no evidence of fracture; visualized osseous structures are unremarkable in appearance. The orbits are within normal limits. There is near complete opacification of the right side of the sphenoid sinus and right frontal sinus. The remaining paranasal sinuses and mastoid air cells are well-aerated. No significant soft tissue abnormalities are seen. CT CERVICAL SPINE FINDINGS There is no evidence of fracture or subluxation. Vertebral bodies demonstrate normal height. Multilevel disc space narrowing is noted along the cervical spine, with scattered anterior and posterior disc osteophyte complexes. There is minimal grade 1 anterolisthesis of C7 on T1, reflecting underlying facet disease. Prevertebral soft tissues are within normal limits. The thyroid gland is unremarkable in appearance. The visualized lung apices are clear. Calcification is noted at the left carotid bifurcation.  Postoperative change  is noted about the right common and internal carotid artery. IMPRESSION: 1. No evidence of traumatic intracranial injury or fracture. 2. No evidence of fracture or subluxation along the cervical spine. 3. Moderate cortical volume loss and scattered small vessel ischemic microangiopathy. 4. Mild degenerative change noted along the cervical spine. 5. Near complete opacification of the right side of the sphenoid sinus and the right frontal sinus. 6. Calcification at the left carotid bifurcation. Electronically Signed   By: Garald Balding M.D.   On: 03/24/2015 00:30   Ct Chest Wo Contrast  03/24/2015  CLINICAL DATA:  Post fall, slipped out of chair onto floor. Weakness. Cough, fever and dyspnea. EXAM: CT CHEST WITHOUT CONTRAST TECHNIQUE: Multidetector CT imaging of the chest was performed following the standard protocol without IV contrast. COMPARISON:  Chest radiographs 03/20/2015 FINDINGS: Multi chamber cardiomegaly. There are mitral annulus calcifications. Coronary artery calcifications are seen. Thoracic aorta is ectatic with mild aneurysmal dilatation of the ascending aorta, 4.2 cm. There is moderate atherosclerotic calcification. No periaortic soft tissue stranding. No mediastinal or evident hilar adenopathy. No pericardial effusion. Diminutive bilateral pleural effusions, left greater than right. Multifocal small pleural based calcifications bilaterally. No consolidation to suggest pneumonia. No pneumothorax or pneumomediastinum. No suspicious pulmonary nodule or mass. No pulmonary edema. Multilevel degenerative change in the thoracic spine without acute fracture or subluxation. No acute rib fracture. Sternum is intact. No acute abnormality in the included upper abdomen. IMPRESSION: 1. No definite acute abnormality. There are trace bilateral pleural effusions. 2. Multi chamber cardiomegaly with coronary artery calcifications. Ectatic thoracic aorta with mild aneurysmal dilatation of the  ascending portion measuring up to 4.2 cm. Recommend annual imaging followup by CTA or MRA. This recommendation follows 2010 ACCF/AHA/AATS/ACR/ASA/SCA/SCAI/SIR/STS/SVM Guidelines for the Diagnosis and Management of Patients with Thoracic Aortic Disease. Circulation. 2010; 121: N170-Y174 3. Multifocal small pleural based calcifications, may reflect pleural plaques and sequela of prior asbestos exposure. Electronically Signed   By: Jeb Levering M.D.   On: 03/24/2015 00:34   Ct Cervical Spine Wo Contrast  03/24/2015  CLINICAL DATA:  Status post fall. Slid out of chair onto floor. Generalized weakness for several weeks. Concern for chest or cervical spine injury. Initial encounter. EXAM: CT HEAD WITHOUT CONTRAST CT CERVICAL SPINE WITHOUT CONTRAST TECHNIQUE: Multidetector CT imaging of the head and cervical spine was performed following the standard protocol without intravenous contrast. Multiplanar CT image reconstructions of the cervical spine were also generated. COMPARISON:  MRI of the brain performed 12/23/2013, and CT of the head performed 03/16/2010. FINDINGS: CT HEAD FINDINGS There is no evidence of acute infarction, mass lesion, or intra- or extra-axial hemorrhage on CT. Prominence of the ventricles and sulci reflects moderate cortical volume loss. Cerebellar atrophy is noted. Mild periventricular and subcortical white matter change likely reflects small vessel ischemic microangiopathy. The brainstem and fourth ventricle are within normal limits. The basal ganglia are unremarkable in appearance. The cerebral hemispheres demonstrate grossly normal gray-white differentiation. No mass effect or midline shift is seen. There is no evidence of fracture; visualized osseous structures are unremarkable in appearance. The orbits are within normal limits. There is near complete opacification of the right side of the sphenoid sinus and right frontal sinus. The remaining paranasal sinuses and mastoid air cells are  well-aerated. No significant soft tissue abnormalities are seen. CT CERVICAL SPINE FINDINGS There is no evidence of fracture or subluxation. Vertebral bodies demonstrate normal height. Multilevel disc space narrowing is noted along the cervical spine, with scattered anterior and posterior disc  osteophyte complexes. There is minimal grade 1 anterolisthesis of C7 on T1, reflecting underlying facet disease. Prevertebral soft tissues are within normal limits. The thyroid gland is unremarkable in appearance. The visualized lung apices are clear. Calcification is noted at the left carotid bifurcation. Postoperative change is noted about the right common and internal carotid artery. IMPRESSION: 1. No evidence of traumatic intracranial injury or fracture. 2. No evidence of fracture or subluxation along the cervical spine. 3. Moderate cortical volume loss and scattered small vessel ischemic microangiopathy. 4. Mild degenerative change noted along the cervical spine. 5. Near complete opacification of the right side of the sphenoid sinus and the right frontal sinus. 6. Calcification at the left carotid bifurcation. Electronically Signed   By: Garald Balding M.D.   On: 03/24/2015 00:30    Review of Systems  Constitutional: Negative for fever and chills.  HENT: Negative for sore throat and tinnitus.   Eyes: Negative for blurred vision and redness.  Respiratory: Negative for cough and shortness of breath.   Cardiovascular: Negative for chest pain, palpitations, orthopnea and PND.  Gastrointestinal: Negative for nausea, vomiting, abdominal pain and diarrhea.  Genitourinary: Negative for dysuria, urgency and frequency.  Musculoskeletal: Positive for neck pain. Negative for myalgias and joint pain.       Left shoulder/clavicle pain  Skin: Negative for rash.       No lesions  Neurological: Negative for speech change, focal weakness and weakness.  Endo/Heme/Allergies: Does not bruise/bleed easily.       No temperature  intolerance  Psychiatric/Behavioral: Negative for depression and suicidal ideas.    Blood pressure 117/64, pulse 105, resp. rate 19, SpO2 95 %. Physical Exam  Nursing note and vitals reviewed. Constitutional: He is oriented to person, place, and time. He appears well-developed and well-nourished. No distress.  HENT:  Head: Normocephalic and atraumatic.  Mouth/Throat: Oropharynx is clear and moist.  Eyes: Conjunctivae and EOM are normal. Pupils are equal, round, and reactive to light. No scleral icterus.  Neck: Normal range of motion. Neck supple. No JVD present. No tracheal deviation present. No thyromegaly present.  Cardiovascular: Regular rhythm and normal heart sounds.  Tachycardia present.  Exam reveals no gallop and no friction rub.   No murmur heard. Respiratory: Breath sounds normal. No respiratory distress. He has no wheezes. He has no rales. He exhibits tenderness.  GI: Soft. Bowel sounds are normal. He exhibits distension. He exhibits no mass. There is no tenderness. There is no rebound and no guarding.  Genitourinary:  Deferred  Musculoskeletal: Normal range of motion. He exhibits edema.  L clavicle/trapezius tenderness; no crepitus  Lymphadenopathy:    He has no cervical adenopathy.  Neurological: He is alert and oriented to person, place, and time. No cranial nerve deficit.  Skin: Skin is warm and dry. No rash noted. No erythema.  Psychiatric: He has a normal mood and affect. His behavior is normal. Judgment and thought content normal.     Assessment/Plan This is a 81 year old Caucasian male with CML admitted for left sided pain secondary to mechanical fall. 1. Chest pain: More accurately left upper torso pain that includes the shoulder and clavicle. There is no crepitus nor laxity in the shoulder joint. Chest pain is likely musculoskeletal however we will continue to trend his cardiac enzymes. EKG shows no indication of myocardial ischemia.  2. Heart failure:The patient's  caregiver mentions that he has congestive heart failure but this is not listed as a chronic medical problem. I can see that there has  been a 2-D echocardiogram ordered but cannot view the report. Notably the patient is not on a beta blocker. He is tachycardic at this time. I have started him on a low dose of carvedilol to help improve filling time. Lungs are clear to auscultation. 3. CML: Leukocytosis increased over last week. No indication of hyperviscosity or fever.The patient reports recent bone marrow biopsy showing low-grade leukemia. 4. CVA: History of left-sided stroke (residual right sided weakness). Continue Aggrenox 5. Benign prostatic hypertrophy: Continue finasteride and tamsulosin 6. DVT prophylaxis: Heparin 7. GI prophylaxis: None The patient is a DO NOT RESUSCITATE. Time spent on admission orders and patient care approximately 45 minutes  Harrie Foreman 03/24/2015, 3:04 AM

## 2015-03-24 NOTE — Care Management Note (Signed)
Case Management Note  Patient Details  Name: Lucas Newman MRN: [id number] Date of Birth: 1919/08/15  Subjective/Objective:                 Patient admitted from home chest pain.  Patient lives at home with his wife who is currently in rehab.  Patient has adult children near by for support.  Patient has a walker rolling walker, wheelchair, and shower chair available at the house if needed.  Patient currently has aide services in the home 5 hours a day, 7 days a week provided by [employer].  PT is currently recommending SNF.  CSW following.  Paperwork faxed to [facility]   Action/Plan: RNCM to follow  Expected Discharge Date:                  Expected Discharge Plan:     In-House Referral:     Discharge planning Services     Post Acute Care Choice:    Choice offered to:     DME Arranged:    DME Agency:     HH Arranged:    [place] Agency:     Status of Service:     Medicare Important Message Given:  Yes Date Medicare IM Given:    Medicare IM give by:    Date Additional Medicare IM Given:    Additional Medicare Important Message give by:     If discussed at [place] of Stay Meetings, dates discussed:    Additional Comments:  Beverly Sessions, RN 03/24/2015, 2:51 PM

## 2015-03-24 NOTE — Progress Notes (Signed)
OT Cancellation Note  Patient Details Name: Lucas Newman MRN: [id number] DOB: 27-Jan-1920   Cancelled Treatment:    Reason Eval/Treat Not Completed: Patient at procedure or test/ unavailable.  Pt not available due to getting an Echo.  Met with his daughter and daughter in law and reviewed adaptive equipment to increase safety in home and gave them an adaptive equipment catalog as well.  Will attempt evaluation again later or tomorrow.   [place], OTR/L ascom [phone] 03/24/2015, 2:00 PM

## 2015-03-24 NOTE — Progress Notes (Signed)
[facility] NAME: Lucas Newman    MR#:  [id number]  DATE OF BIRTH:  02-Dec-1919  SUBJECTIVE:  CHIEF COMPLAINT:   Chief Complaint  Patient presents with  . Fall   Complains of left neck pain and soreness, lower extremity edema  REVIEW OF SYSTEMS:   Review of Systems  Constitutional: Negative for fever.  Respiratory: Negative for shortness of breath.   Cardiovascular: Positive for leg swelling. Negative for chest pain and palpitations.  Gastrointestinal: Negative for nausea, vomiting and abdominal pain.  Genitourinary: Negative for dysuria.  Neurological: Positive for weakness.    DRUG ALLERGIES:  No Known Allergies  VITALS:  Blood pressure 116/62, pulse 75, temperature 98.8 F (37.1 C), temperature source Oral, resp. rate 18, height 5' 7" (1.702 m), weight 83.144 kg (183 lb 4.8 oz), SpO2 92 %.  PHYSICAL EXAMINATION:  GENERAL:  81 y.o.-year-old patient lying in the bed with no acute distress.  LUNGS: Normal breath sounds bilaterally, no wheezing, rales, rhonchi or crepitation. No use of accessory muscles of respiration.  CARDIOVASCULAR: S1, S2 normal. No murmurs, rubs, or gallops.  ABDOMEN: Soft, nontender, distended. Bowel sounds present. No organomegaly or mass.  EXTREMITIES: No pedal edema, cyanosis, or clubbing.  NEUROLOGIC: Cranial nerves II through XII are intact. Muscle strength 5/5 in all extremities. Sensation intact. Gait not checked.  PSYCHIATRIC: The patient is alert and oriented x 3.  SKIN: No obvious rash, lesion, or ulcer.    LABORATORY PANEL:   CBC  Recent Labs Lab 03/23/15 2310  WBC 32.8*  HGB 8.7*  HCT 27.8*  PLT 198   ------------------------------------------------------------------------------------------------------------------  Chemistries   Recent Labs Lab 03/23/15 2310  NA 142  K 4.1  CL 107  CO2 23  GLUCOSE 147*  BUN 28*  CREATININE 1.84*  CALCIUM 8.9  AST 16   ALT 10*  ALKPHOS 50  BILITOT 0.5   ------------------------------------------------------------------------------------------------------------------  Cardiac Enzymes  Recent Labs Lab 03/24/15 1012  TROPONINI 0.03   ------------------------------------------------------------------------------------------------------------------  RADIOLOGY:  Dg Thoracic Spine 2 View  03/24/2015  CLINICAL DATA:  Status post fall to floor from chair. Upper back pain. Initial encounter. EXAM: THORACIC SPINE 2 VIEWS COMPARISON:  CT of the chest performed earlier today at 12:07 a.m. FINDINGS: There is no evidence of fracture or subluxation. Vertebral bodies demonstrate normal height and alignment. Intervertebral disc spaces are preserved. Anterior bridging osteophytes are noted along the thoracic spine. The visualized portions of both lungs are clear. The mediastinum is unremarkable in appearance. IMPRESSION: No evidence of fracture or subluxation along the thoracic spine. Electronically Signed   By: Garald Balding M.D.   On: 03/24/2015 00:32   Dg Lumbar Spine 2-3 Views  03/24/2015  CLINICAL DATA:  Status post fall to floor from chair. Lower back pain. Initial encounter. EXAM: LUMBAR SPINE - 2-3 VIEW COMPARISON:  CT of the abdomen and pelvis performed 04/07/2010 FINDINGS: There is no evidence of fracture or subluxation. The anterior and lateral osteophytes are noted along the lumbar spine. Vertebral bodies demonstrate normal height and alignment. Multilevel disc space narrowing is noted along the lumbar spine. The visualized bowel gas pattern is unremarkable in appearance; air and stool are noted within the colon. Sclerotic change is noted at the sacroiliac joints. Scattered vascular calcifications are seen. IMPRESSION: 1. No evidence of fracture or subluxation along the lumbar spine. 2. Mild diffuse degenerative change along the lumbar spine. 3. Scattered vascular calcifications seen. Electronically Signed   By:  Garald Balding M.D.   On: 03/24/2015 00:35   Ct Head Wo Contrast  03/24/2015  CLINICAL DATA:  Status post fall. Slid out of chair onto floor. Generalized weakness for several weeks. Concern for chest or cervical spine injury. Initial encounter. EXAM: CT HEAD WITHOUT CONTRAST CT CERVICAL SPINE WITHOUT CONTRAST TECHNIQUE: Multidetector CT imaging of the head and cervical spine was performed following the standard protocol without intravenous contrast. Multiplanar CT image reconstructions of the cervical spine were also generated. COMPARISON:  MRI of the brain performed 12/23/2013, and CT of the head performed 03/16/2010. FINDINGS: CT HEAD FINDINGS There is no evidence of acute infarction, mass lesion, or intra- or extra-axial hemorrhage on CT. Prominence of the ventricles and sulci reflects moderate cortical volume loss. Cerebellar atrophy is noted. Mild periventricular and subcortical white matter change likely reflects small vessel ischemic microangiopathy. The brainstem and fourth ventricle are within normal limits. The basal ganglia are unremarkable in appearance. The cerebral hemispheres demonstrate grossly normal gray-white differentiation. No mass effect or midline shift is seen. There is no evidence of fracture; visualized osseous structures are unremarkable in appearance. The orbits are within normal limits. There is near complete opacification of the right side of the sphenoid sinus and right frontal sinus. The remaining paranasal sinuses and mastoid air cells are well-aerated. No significant soft tissue abnormalities are seen. CT CERVICAL SPINE FINDINGS There is no evidence of fracture or subluxation. Vertebral bodies demonstrate normal height. Multilevel disc space narrowing is noted along the cervical spine, with scattered anterior and posterior disc osteophyte complexes. There is minimal grade 1 anterolisthesis of C7 on T1, reflecting underlying facet disease. Prevertebral soft tissues are within  normal limits. The thyroid gland is unremarkable in appearance. The visualized lung apices are clear. Calcification is noted at the left carotid bifurcation. Postoperative change is noted about the right common and internal carotid artery. IMPRESSION: 1. No evidence of traumatic intracranial injury or fracture. 2. No evidence of fracture or subluxation along the cervical spine. 3. Moderate cortical volume loss and scattered small vessel ischemic microangiopathy. 4. Mild degenerative change noted along the cervical spine. 5. Near complete opacification of the right side of the sphenoid sinus and the right frontal sinus. 6. Calcification at the left carotid bifurcation. Electronically Signed   By: Garald Balding M.D.   On: 03/24/2015 00:30   Ct Chest Wo Contrast  03/24/2015  CLINICAL DATA:  Post fall, slipped out of chair onto floor. Weakness. Cough, fever and dyspnea. EXAM: CT CHEST WITHOUT CONTRAST TECHNIQUE: Multidetector CT imaging of the chest was performed following the standard protocol without IV contrast. COMPARISON:  Chest radiographs 03/20/2015 FINDINGS: Multi chamber cardiomegaly. There are mitral annulus calcifications. Coronary artery calcifications are seen. Thoracic aorta is ectatic with mild aneurysmal dilatation of the ascending aorta, 4.2 cm. There is moderate atherosclerotic calcification. No periaortic soft tissue stranding. No mediastinal or evident hilar adenopathy. No pericardial effusion. Diminutive bilateral pleural effusions, left greater than right. Multifocal small pleural based calcifications bilaterally. No consolidation to suggest pneumonia. No pneumothorax or pneumomediastinum. No suspicious pulmonary nodule or mass. No pulmonary edema. Multilevel degenerative change in the thoracic spine without acute fracture or subluxation. No acute rib fracture. Sternum is intact. No acute abnormality in the included upper abdomen. IMPRESSION: 1. No definite acute abnormality. There are trace  bilateral pleural effusions. 2. Multi chamber cardiomegaly with coronary artery calcifications. Ectatic thoracic aorta with mild aneurysmal dilatation of the ascending portion measuring up to 4.2 cm. Recommend annual imaging followup by  CTA or MRA. This recommendation follows 2010 ACCF/AHA/AATS/ACR/ASA/SCA/SCAI/SIR/STS/SVM Guidelines for the Diagnosis and Management of Patients with Thoracic Aortic Disease. Circulation. 2010; 121: F573-U202 3. Multifocal small pleural based calcifications, may reflect pleural plaques and sequela of prior asbestos exposure. Electronically Signed   By: Jeb Levering M.D.   On: 03/24/2015 00:34   Ct Cervical Spine Wo Contrast  03/24/2015  CLINICAL DATA:  Status post fall. Slid out of chair onto floor. Generalized weakness for several weeks. Concern for chest or cervical spine injury. Initial encounter. EXAM: CT HEAD WITHOUT CONTRAST CT CERVICAL SPINE WITHOUT CONTRAST TECHNIQUE: Multidetector CT imaging of the head and cervical spine was performed following the standard protocol without intravenous contrast. Multiplanar CT image reconstructions of the cervical spine were also generated. COMPARISON:  MRI of the brain performed 12/23/2013, and CT of the head performed 03/16/2010. FINDINGS: CT HEAD FINDINGS There is no evidence of acute infarction, mass lesion, or intra- or extra-axial hemorrhage on CT. Prominence of the ventricles and sulci reflects moderate cortical volume loss. Cerebellar atrophy is noted. Mild periventricular and subcortical white matter change likely reflects small vessel ischemic microangiopathy. The brainstem and fourth ventricle are within normal limits. The basal ganglia are unremarkable in appearance. The cerebral hemispheres demonstrate grossly normal gray-white differentiation. No mass effect or midline shift is seen. There is no evidence of fracture; visualized osseous structures are unremarkable in appearance. The orbits are within normal limits. There  is near complete opacification of the right side of the sphenoid sinus and right frontal sinus. The remaining paranasal sinuses and mastoid air cells are well-aerated. No significant soft tissue abnormalities are seen. CT CERVICAL SPINE FINDINGS There is no evidence of fracture or subluxation. Vertebral bodies demonstrate normal height. Multilevel disc space narrowing is noted along the cervical spine, with scattered anterior and posterior disc osteophyte complexes. There is minimal grade 1 anterolisthesis of C7 on T1, reflecting underlying facet disease. Prevertebral soft tissues are within normal limits. The thyroid gland is unremarkable in appearance. The visualized lung apices are clear. Calcification is noted at the left carotid bifurcation. Postoperative change is noted about the right common and internal carotid artery. IMPRESSION: 1. No evidence of traumatic intracranial injury or fracture. 2. No evidence of fracture or subluxation along the cervical spine. 3. Moderate cortical volume loss and scattered small vessel ischemic microangiopathy. 4. Mild degenerative change noted along the cervical spine. 5. Near complete opacification of the right side of the sphenoid sinus and the right frontal sinus. 6. Calcification at the left carotid bifurcation. Electronically Signed   By: Garald Balding M.D.   On: 03/24/2015 00:30    EKG:   Orders placed or performed during the hospital encounter of 03/23/15  . EKG 12-Lead  . EKG 12-Lead    ASSESSMENT AND PLAN:   #1 left neck pain: CT, x-rays showing no fracture. No crepitus or laxity. No D formation. Continue to work with physical therapy. Likely muscular spasm. Possible tendon tear but at this time would not be appropriate to get an MRI. Would follow up. He can use a heating pad and Tylenol for pain.  #2 chest pain: No chest pain today. No EKG changes. Troponins negative 3. There was some concern for cardiac enlargement on chest CT. He did have an echo in  March. I have repeated echo today to see if there has been any change. No history of systolic dysfunction. He was started on low-dose carvedilol on admission.  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  Stable  #4 history of CVA: Continue Aggrenox. No changes  #5 BPH: Continue finasteride and tamsulosin  #6 weakness: Family is very concerned that the patient is alone at night and may have another fall. Physical therapy has recommended skilled nursing. Unfortunately he is admitted under observation and we are not able to qualify him for skilled nursing this way. Will likely need to discharge with home health nurse and physical therapy.  All the records are reviewed and case discussed with Care Management/Social Workerr. Management plans discussed with the patient, family and they are in agreement.  CODE STATUS: DO NOT RESUSCITATE  TOTAL TIME TAKING CARE OF THIS PATIENT: 35 minutes.  Greater than 50% of time spent in care coordination and counseling. POSSIBLE D/C IN 1 DAYS, DEPENDING ON CLINICAL CONDITION.   Myrtis Ser M.D on 03/24/2015 at 4:27 PM  Between 7am to 6pm - Pager - [phone]  After 6pm go to [email or web address] - password [email or web address] [facility]  Office  [phone]  CC: Primary care physician; Lavera Guise, MD

## 2015-03-24 NOTE — Care Management Important Message (Signed)
Important Message  Patient Details  Name: Lucas Newman MRN: [id number] Date of Birth: 1919-06-29   Medicare Important Message Given:  Yes    Beverly Sessions, RN 03/24/2015, 9:58 AM

## 2015-03-24 NOTE — ED Notes (Addendum)
ER pharmacy tech--forms faxed to [facility] for medication list, will be completed once infoemation is received back from [place]

## 2015-03-24 NOTE — Clinical Social Work Note (Signed)
Clinical Social Work Assessment  Patient Details  Name: Lucas Newman MRN: [id number] Date of Birth: 1920/04/25  Date of referral:  03/24/15               Reason for consult:  Facility Placement                Permission sought to share information with:  Family Supports Permission granted to share information::  Yes, Verbal Permission Granted  Name::      (Daughter, Lucas Newman)   Housing/Transportation Living arrangements for the past 2 months:  Single Family Home Source of Information:  Patient Patient Interpreter Needed:  None Criminal Activity/Legal Involvement Pertinent to Current Situation/Hospitalization:  No - Comment as needed Significant Relationships:  Siblings Lives with:  Self Do you feel safe going back to the place where you live?   not at this time Need for family participation in patient care:   yes  Care giving concerns:  Pt does not have 24 support and needs assistance.    Social Worker assessment / plan:  CSW met with pt to address consult. PT is recommending SNF. Pt is Medicare OBS. Pt and family are agreeable to pay privately as pt does not have 24 hour support. CSW completed FL2/PASARR and will follow up with bed offers. Preference is [facility].   Employment status:  Retired Forensic scientist:  Medicare PT Recommendations:  [place] / Referral to community resources:  [place]  Patient/Family's Response to care:  Pt and family understand that he needs support at home. And were appreciative of CSW support.   Patient/Family's Understanding of and Emotional Response to Diagnosis, Current Treatment, and Prognosis:  Pt's family understands that pt needs a higher level of care and is not safe to go home.   Emotional Assessment Appearance:  Appears stated age Attitude/Demeanor/Rapport:  Angry Affect (typically observed):  Agitated Orientation:  Oriented to Self, Oriented to Place, Oriented to  Time, Oriented  to Situation Alcohol / Substance use:  Never Used Psych involvement (Current and /or in the community):  No (Comment)  Discharge Needs  Concerns to be addressed:  Adjustment to Illness Readmission within the last 30 days:  No Current discharge risk:  Chronically ill Barriers to Discharge:  No Barriers Identified   Darden Dates, LCSW 03/24/2015, 4:46 PM

## 2015-03-24 NOTE — Evaluation (Signed)
Physical Therapy Evaluation Patient Details Name: Lucas Newman MRN: [id number] DOB: 29-Sep-1919 Today's Date: 03/24/2015   History of Present Illness  Pt is a 81 yo male who was came to the hospital after a mechanical fall resulting in left shoulder and neck pain  Clinical Impression  Pt presents with hx of GERD, arthritis, CVA and CML. Examination reveals that pt has generalized weakness in his legs, impaired gait, and decreased activity tolerance. Additionally all bed mobility and transfers are affected by the acute pain in his LUE. It is found that pt's gait performance and endurance are off of his baseline level of function. He states that he has "chronic cough", but does not desaturate with ambulation (reference ambulation section). Pt also has a history of two falls in the past 6 months. Due to these impairments, pt will benefit from skilled PT in order to return him to optimal PLOF. Pt is pleasant and receptive to therapy tasks. This entire session was guided, instructed, and directly supervised by Greggory Stallion, DPT.    Follow Up Recommendations SNF (Has rehab potential. If refusing SNF, will need 24 hr & HHPT)    Equipment Recommendations  None recommended by PT    Recommendations for Other Services       Precautions / Restrictions Precautions Precautions: Fall Restrictions Weight Bearing Restrictions: No      Mobility  Bed Mobility Overal bed mobility: Needs Assistance Bed Mobility: Supine to Sit     Supine to sit: Mod assist     General bed mobility comments: Pt needs assist of LE and trunk to get into sitting with limited use of LUE secondary to pain.   Transfers Overall transfer level: Needs assistance Equipment used: Rolling walker (2 wheeled) Transfers: Sit to/from Stand Sit to Stand: Min assist         General transfer comment: Pt requires assist to get up into standing. Needs cues for hand placement in order to manage pain of LUE. No buckling or  LOB in standing with RW  Ambulation/Gait Ambulation/Gait assistance: Min assist Ambulation Distance (Feet): 30 Feet Assistive device: Rolling walker (2 wheeled) Gait Pattern/deviations: Step-through pattern;Decreased step length - right;Decreased step length - left;Decreased stride length;Shuffle Gait velocity: decreased Gait velocity interpretation: <1.8 ft/sec, indicative of risk for recurrent falls General Gait Details: Pt slow ambulation with shuffle-like pattern. He requires increased time to ambulate 30 ft. Gait pattern is removed from his baseline; per patient "slower and more difficult" Pt on room air SaO2 was 97%. Following ambulation pt was at 93% and was therefore left off of O2 [place] secondary to pt not having home oxygen. Nurse notified and in agreement   Stairs            Wheelchair Mobility    Modified Rankin (Stroke Patients Only)       Balance Overall balance assessment: History of Falls (2 in past 6 months during ambulation and transfers)                                           Pertinent Vitals/Pain Pain Assessment: 0-10 Pain Score: 5  Pain Location: L shoulder Pain Descriptors / Indicators: Constant Pain Intervention(s): Limited activity within patient's tolerance;Monitored during session    Home Living Family/patient expects to be discharged to:: Private residence Living Arrangements: Spouse/significant other Available Help at Discharge: Available PRN/intermittently;Personal care attendant (Wife at rehab. No assistance  at night. Caregivers during day) Type of Home:  (town house) Home Access: Stairs to enter   [employer] of Steps: 3 steps to enter (B rail front stairs; 1 rail in garage) Home Layout: Able to live on main level with bedroom/bathroom Home Equipment: [place] - 4 wheels;Walker - 2 wheels      Prior Function Level of Independence: Independent with assistive device(s)         Comments: Pt was ambulating  household distances with RW or rollator. Pt receives assist from caregivers during the day for ADLs.      Hand Dominance        Extremity/Trunk Assessment   Upper Extremity Assessment:  (difficult to assess due to LUE and trunkal pain)           Lower Extremity Assessment: Generalized weakness (Pt struggles with AROM SLR; needs assist with LE bed mob)         Communication   Communication: No difficulties  Cognition Arousal/Alertness: Awake/alert Behavior During Therapy: WFL for tasks assessed/performed Overall Cognitive Status: Within Functional Limits for tasks assessed                      General Comments      Exercises        Assessment/Plan    PT Assessment Patient needs continued PT services  PT Diagnosis Difficulty walking;Abnormality of gait;Generalized weakness   PT Problem List Decreased strength;Decreased activity tolerance;Decreased balance;Pain  PT Treatment Interventions DME instruction;Gait training;Stair training;Functional mobility training;Therapeutic activities;Therapeutic exercise;Balance training;Neuromuscular re-education   PT Goals (Current goals can be found in the Care Plan section) Acute Rehab PT Goals Patient Stated Goal: none stated PT Goal Formulation: With patient Time For Goal Achievement: 04/07/15 Potential to Achieve Goals: Good    Frequency Min 2X/week   Barriers to discharge        Co-evaluation               End of Session Equipment Utilized During Treatment: Gait belt Activity Tolerance: Patient tolerated treatment well;Patient limited by fatigue Patient left: in chair;with call bell/phone within reach;with chair alarm set Nurse Communication: Mobility status    Functional Assessment Tool Used: 5 time sit-to-stand: 43 seconds (Indicative of high risk for falls)    Time: [id number] PT Time Calculation (min) (ACUTE ONLY): 17 min   Charges:         PT G Codes:   PT G-Codes **NOT FOR INPATIENT  CLASS** Functional Assessment Tool Used: 5 time sit-to-stand: 43 seconds (Indicative of high risk for falls)    Janyth Contes 03/24/2015, 10:44 AM  Janyth Contes, SPT. [phone]

## 2015-03-24 NOTE — Plan of Care (Signed)
Pt taking scheduled aggrenox and heparin.  Was concerned about him taking both and spoke to Dr. Volanda Napoleon who sd to continue both as ordered.

## 2015-03-24 NOTE — Care Management Obs Status (Signed)
[place] NOTIFICATION   Patient Details  Name: VERDEN DONINI MRN: [id number] Date of Birth: 25-May-1919   Medicare Observation Status Notification Given:  Yes    Beverly Sessions, RN 03/24/2015, 2:43 PM

## 2015-03-24 NOTE — NC FL2 (Signed)
[place] LEVEL OF CARE SCREENING TOOL     IDENTIFICATION  Patient Name: Lucas Newman Birthdate: 07-Mar-1920 Sex: male Admission Date (Current Location): 03/23/2015  [place] and [place] Number:  [facility] )   Facility and Address:  [facility], [address], [place], [place] [address]      Provider Number: [id number] [phone])  Attending Physician Name and Address:  Aldean Jewett, MD  Relative Name and Phone Number:       Current Level of Care: Hospital Recommended Level of Care: [place] Prior Approval Number:    Date Approved/Denied:   PASRR Number:  ([id number])  Discharge Plan: SNF    Current Diagnoses: Patient Active Problem List   Diagnosis Date Noted  . Fall 03/24/2015  . Cough 05/11/2014  . Shortness of breath 05/11/2014  . Temporal arteritis (Green) 05/11/2014    Orientation ACTIVITIES/SOCIAL BLADDER RESPIRATION    Self, Time, Situation, Place  Active Continent O2 (As needed) (1 liter oxygen )  BEHAVIORAL SYMPTOMS/MOOD NEUROLOGICAL BOWEL NUTRITION STATUS   (none )  (none ) Continent Diet (Diet: Heart Healthy )  PHYSICIAN VISITS COMMUNICATION OF NEEDS Height & Weight Skin  30 days Verbally 5\' 7"  (170.2 cm) 183 lbs. Normal          AMBULATORY STATUS RESPIRATION    Assist extensive O2 (As needed) (1 liter oxygen )      Personal Care Assistance Level of Assistance  Bathing, Feeding, Dressing Bathing Assistance: Limited assistance Feeding assistance: Independent Dressing Assistance: Limited assistance      Functional Limitations Info  Sight, Hearing, Speech Sight Info: Adequate Hearing Info: Impaired Speech Info: Adequate       SPECIAL CARE FACTORS FREQUENCY  PT (By licensed PT)     PT Frequency:  (5)             Additional Factors Info  Code Status Code Status Info:  (DNR)             Current Medications (03/24/2015): Current Facility-Administered  Medications  Medication Dose Route Frequency Provider Last Rate Last Dose  . acetaminophen (TYLENOL) tablet 650 mg  650 mg Oral Q6H PRN Harrie Foreman, MD       Or  . acetaminophen (TYLENOL) suppository 650 mg  650 mg Rectal Q6H PRN Harrie Foreman, MD      . calcium-vitamin D (OSCAL WITH D) 500-200 MG-UNIT per tablet 1 tablet  1 tablet Oral Daily Harrie Foreman, MD   1 tablet at 03/24/15 0840  . carvedilol (COREG) tablet 6.25 mg  6.25 mg Oral BID WC Harrie Foreman, MD   6.25 mg at 03/24/15 0840  . dipyridamole-aspirin (AGGRENOX) 200-25 MG per 12 hr capsule 1 capsule  1 capsule Oral BID Harrie Foreman, MD   1 capsule at 03/24/15 [phone]  . docusate sodium (COLACE) capsule 100 mg  100 mg Oral BID Harrie Foreman, MD   100 mg at 03/24/15 [id number]  . finasteride (PROSCAR) tablet 5 mg  5 mg Oral Daily Harrie Foreman, MD   5 mg at 03/24/15 [id number]  . fluticasone (FLONASE) 50 MCG/ACT nasal spray 1 spray  1 spray Each Nare BID PRN Harrie Foreman, MD      . heparin injection 5,000 Units  5,000 Units Subcutaneous 3 times per day Harrie Foreman, MD   5,000 Units at 03/24/15 1559  . loratadine (CLARITIN) tablet 10 mg  10 mg Oral Daily Harrie Foreman,  MD   10 mg at 03/24/15 0840  . magnesium oxide (MAG-OX) tablet 400 mg  400 mg Oral Daily Harrie Foreman, MD   400 mg at 03/24/15 [id number]  . morphine 2 MG/ML injection 1 mg  1 mg Intravenous Q3H PRN Harrie Foreman, MD   1 mg at 03/24/15 [id number]  . multivitamin with minerals tablet 1 tablet  1 tablet Oral Daily Harrie Foreman, MD   1 tablet at 03/24/15 [phone]  . ondansetron (ZOFRAN) tablet 4 mg  4 mg Oral Q6H PRN Harrie Foreman, MD       Or  . ondansetron [facility]) injection 4 mg  4 mg Intravenous Q6H PRN Harrie Foreman, MD      . pantoprazole (PROTONIX) EC tablet 40 mg  40 mg Oral Daily Harrie Foreman, MD   40 mg at 03/24/15 [phone]  . predniSONE (DELTASONE) tablet 2.5 mg  2.5 mg Oral Daily Harrie Foreman, MD   2.5 mg at 03/24/15 0839   . senna-docusate (Senokot-S) tablet 1 tablet  1 tablet Oral QHS Harrie Foreman, MD      . simvastatin (ZOCOR) tablet 20 mg  20 mg Oral QHS Harrie Foreman, MD      . sodium chloride 0.9 % injection 3 mL  3 mL Intravenous Q12H Harrie Foreman, MD   3 mL at 03/24/15 0840  . tamsulosin (FLOMAX) capsule 0.4 mg  0.4 mg Oral QHS Harrie Foreman, MD      . zinc sulfate capsule 220 mg  220 mg Oral Daily Harrie Foreman, MD   220 mg at 03/24/15 0840   Do not use this list as official medication orders. Please verify with discharge summary.  Discharge Medications:   Medication List    ASK your doctor about these medications        acetaminophen 650 MG CR tablet  Commonly known as:  TYLENOL  Take 650 mg by mouth every 4 (four) hours as needed for pain.     CALCIUM 600+D 600-800 MG-UNIT Tabs  Generic drug:  Calcium Carb-Cholecalciferol  Take 1 tablet by mouth daily.     CALCIUM-MAGNESIUM-ZINC-D3 PO  Take 1 tablet by mouth daily.     dipyridamole-aspirin 200-25 MG 12hr capsule  Commonly known as:  AGGRENOX  Take 1 capsule by mouth 2 (two) times daily.     finasteride 5 MG tablet  Commonly known as:  PROSCAR  Take 5 mg by mouth daily.     fluticasone 50 MCG/ACT nasal spray  Commonly known as:  FLONASE  Place 1 spray into both nostrils 2 (two) times daily as needed for rhinitis.     loratadine 10 MG tablet  Commonly known as:  CLARITIN  Take 10 mg by mouth daily.     Magnesium 250 MG Tabs  Take 250 mg by mouth at bedtime.     multivitamin with minerals Tabs tablet  Take 1 tablet by mouth daily.     omeprazole 20 MG capsule  Commonly known as:  PRILOSEC  Take 20 mg by mouth 2 (two) times daily.     predniSONE 5 MG tablet  Commonly known as:  DELTASONE  Take 2.5 mg by mouth daily.     senna-docusate 8.6-50 MG tablet  Commonly known as:  Senokot-S  Take 1 tablet by mouth at bedtime.     simvastatin 20 MG tablet  Commonly known as:  ZOCOR  Take 20 mg by mouth at  bedtime.  tamsulosin 0.4 MG Caps capsule  Commonly known as:  FLOMAX  Take 0.4 mg by mouth at bedtime.        Relevant Imaging Results:  Relevant Lab Results:  Recent Labs    Additional Information  (SSN: [id number])  Loralyn Freshwater, LCSW

## 2015-03-24 NOTE — Progress Notes (Signed)
*  PRELIMINARY RESULTS* Echocardiogram 2D Echocardiogram has been performed.  Laqueta Jean Hege 03/24/2015, 2:03 PM

## 2015-03-25 DIAGNOSIS — K219 Gastro-esophageal reflux disease without esophagitis: Secondary | ICD-10-CM | POA: Diagnosis present

## 2015-03-25 DIAGNOSIS — I2510 Atherosclerotic heart disease of native coronary artery without angina pectoris: Secondary | ICD-10-CM | POA: Diagnosis present

## 2015-03-25 DIAGNOSIS — N183 Chronic kidney disease, stage 3 (moderate): Secondary | ICD-10-CM | POA: Diagnosis present

## 2015-03-25 DIAGNOSIS — E785 Hyperlipidemia, unspecified: Secondary | ICD-10-CM | POA: Diagnosis present

## 2015-03-25 DIAGNOSIS — Z8673 Personal history of transient ischemic attack (TIA), and cerebral infarction without residual deficits: Secondary | ICD-10-CM | POA: Diagnosis not present

## 2015-03-25 DIAGNOSIS — Z66 Do not resuscitate: Secondary | ICD-10-CM | POA: Diagnosis present

## 2015-03-25 DIAGNOSIS — Z77090 Contact with and (suspected) exposure to asbestos: Secondary | ICD-10-CM | POA: Diagnosis present

## 2015-03-25 DIAGNOSIS — D631 Anemia in chronic kidney disease: Secondary | ICD-10-CM | POA: Diagnosis not present

## 2015-03-25 DIAGNOSIS — J069 Acute upper respiratory infection, unspecified: Secondary | ICD-10-CM | POA: Diagnosis present

## 2015-03-25 DIAGNOSIS — I5032 Chronic diastolic (congestive) heart failure: Secondary | ICD-10-CM | POA: Diagnosis present

## 2015-03-25 DIAGNOSIS — M542 Cervicalgia: Secondary | ICD-10-CM | POA: Diagnosis present

## 2015-03-25 DIAGNOSIS — W010XXA Fall on same level from slipping, tripping and stumbling without subsequent striking against object, initial encounter: Secondary | ICD-10-CM | POA: Diagnosis present

## 2015-03-25 DIAGNOSIS — I712 Thoracic aortic aneurysm, without rupture: Secondary | ICD-10-CM | POA: Diagnosis present

## 2015-03-25 DIAGNOSIS — M1990 Unspecified osteoarthritis, unspecified site: Secondary | ICD-10-CM | POA: Diagnosis present

## 2015-03-25 DIAGNOSIS — D72829 Elevated white blood cell count, unspecified: Secondary | ICD-10-CM | POA: Diagnosis not present

## 2015-03-25 DIAGNOSIS — D630 Anemia in neoplastic disease: Secondary | ICD-10-CM | POA: Diagnosis present

## 2015-03-25 DIAGNOSIS — Z96653 Presence of artificial knee joint, bilateral: Secondary | ICD-10-CM | POA: Diagnosis present

## 2015-03-25 DIAGNOSIS — N189 Chronic kidney disease, unspecified: Secondary | ICD-10-CM | POA: Diagnosis not present

## 2015-03-25 DIAGNOSIS — Z79899 Other long term (current) drug therapy: Secondary | ICD-10-CM | POA: Diagnosis not present

## 2015-03-25 DIAGNOSIS — C9310 Chronic myelomonocytic leukemia not having achieved remission: Secondary | ICD-10-CM | POA: Diagnosis not present

## 2015-03-25 DIAGNOSIS — R531 Weakness: Secondary | ICD-10-CM | POA: Diagnosis present

## 2015-03-25 DIAGNOSIS — M25512 Pain in left shoulder: Secondary | ICD-10-CM | POA: Diagnosis present

## 2015-03-25 DIAGNOSIS — M62838 Other muscle spasm: Secondary | ICD-10-CM | POA: Diagnosis present

## 2015-03-25 DIAGNOSIS — Y929 Unspecified place or not applicable: Secondary | ICD-10-CM | POA: Diagnosis not present

## 2015-03-25 DIAGNOSIS — N400 Enlarged prostate without lower urinary tract symptoms: Secondary | ICD-10-CM | POA: Diagnosis present

## 2015-03-25 DIAGNOSIS — I776 Arteritis, unspecified: Secondary | ICD-10-CM | POA: Diagnosis present

## 2015-03-25 DIAGNOSIS — Z87891 Personal history of nicotine dependence: Secondary | ICD-10-CM | POA: Diagnosis not present

## 2015-03-25 DIAGNOSIS — R000 Tachycardia, unspecified: Secondary | ICD-10-CM | POA: Diagnosis present

## 2015-03-25 DIAGNOSIS — Z7982 Long term (current) use of aspirin: Secondary | ICD-10-CM | POA: Diagnosis not present

## 2015-03-25 DIAGNOSIS — N179 Acute kidney failure, unspecified: Secondary | ICD-10-CM | POA: Diagnosis present

## 2015-03-25 LAB — CBC
HEMATOCRIT: 25.3 % — AB (ref 40.0–52.0)
Hemoglobin: 8 g/dL — ABNORMAL LOW (ref 13.0–18.0)
MCH: 29.7 pg (ref 26.0–34.0)
MCHC: 31.6 g/dL — AB (ref 32.0–36.0)
MCV: 94.1 fL (ref 80.0–100.0)
PLATELETS: 181 10*3/uL (ref 150–440)
RBC: 2.69 MIL/uL — ABNORMAL LOW (ref 4.40–5.90)
RDW: 14.6 % — AB (ref 11.5–14.5)
WBC: 31 10*3/uL — AB (ref 3.8–10.6)

## 2015-03-25 LAB — BASIC METABOLIC PANEL
ANION GAP: 6 (ref 5–15)
BUN: 34 mg/dL — ABNORMAL HIGH (ref 6–20)
CALCIUM: 8.6 mg/dL — AB (ref 8.9–10.3)
CO2: 28 mmol/L (ref 22–32)
CREATININE: 1.93 mg/dL — AB (ref 0.61–1.24)
Chloride: 104 mmol/L (ref 101–111)
GFR, EST AFRICAN AMERICAN: 33 mL/min — AB (ref >60)
GFR, EST NON AFRICAN AMERICAN: 28 mL/min — AB (ref >60)
Glucose, Bld: 129 mg/dL — ABNORMAL HIGH (ref 65–99)
Potassium: 4.3 mmol/L (ref 3.5–5.1)
SODIUM: 138 mmol/L (ref 135–145)

## 2015-03-25 LAB — CULTURE, BLOOD (ROUTINE X 2)
CULTURE: NO GROWTH
CULTURE: NO GROWTH

## 2015-03-25 MED ORDER — CYCLOBENZAPRINE HCL 10 MG PO TABS
5.0000 mg | ORAL_TABLET | Freq: Three times a day (TID) | ORAL | Status: DC | PRN
  Administered 2015-03-25 – 2015-03-26 (×2): 5 mg via ORAL
  Filled 2015-03-25 (×2): qty 1

## 2015-03-25 MED ORDER — PREDNISONE 10 MG PO TABS
10.0000 mg | ORAL_TABLET | Freq: Every day | ORAL | Status: DC
  Administered 2015-03-26 – 2015-03-28 (×3): 10 mg via ORAL
  Filled 2015-03-25 (×3): qty 1

## 2015-03-25 NOTE — Progress Notes (Signed)
Pt  Occupational Therapy Evaluation Patient Details Name: Lucas Newman MRN: [id number] DOB: 1919/11/03 Today's Date: 04-14-2015    History of Present Illness     Clinical Impression   Pt sitting up in chair - report that he has 10/10 pain in upper back /neck and just got comfortable and did not want to do anything now . Will attempt later again on Monday     Follow Up Recommendations       Equipment Recommendations       Recommendations for Other Services       Precautions / Restrictions        Mobility Bed Mobility                  Transfers                      Balance                                            ADL                                               Vision     Perception     Praxis      Pertinent Vitals/Pain       Hand Dominance     Extremity/Trunk Assessment             Communication     Cognition                           General Comments       Exercises       Shoulder Instructions      Home Living                                          Prior Functioning/Environment               OT Diagnosis:     OT Problem List:     OT Treatment/Interventions:      OT Goals(Current goals can be found in the care plan section)    OT Frequency:     Barriers to D/C:            Co-evaluation              End of Session    Activity Tolerance:   Patient left:     Time:  -    Charges:    G-Codes:    Rosalyn Gess 04-14-15, 10:22 AM

## 2015-03-25 NOTE — Progress Notes (Signed)
Pt alert, generalized weakness present. Pt was not able to stand without x2 assist. Baseline sats were 90 on room air, they dropped to 89 when moving from bed to chair and then came up to 91 after sitting back down. Pt complains of neck, shoulder and chest pain. Morphine and cough medication given. Will continue to monitor.

## 2015-03-25 NOTE — Plan of Care (Signed)
Problem: Skin Integrity: Goal: Risk for impaired skin integrity will decrease Outcome: Progressing Pt with multiple complaints of pain and coughing tonight, guafenisen and benadryl ordered to help with cough and sleep, minimally effective, morphine effective for pain. Continues to have non productive cough.

## 2015-03-25 NOTE — Progress Notes (Signed)
[facility] NAME: Lucas Newman    MR#:  [id number]  DATE OF BIRTH:  1920/03/13  SUBJECTIVE:  CHIEF COMPLAINT:   Chief Complaint  Patient presents with  . Fall   Continues with cough, weakness, left neck pain  REVIEW OF SYSTEMS:   Review of Systems  Constitutional: Negative for fever.  Respiratory: Negative for shortness of breath.   Cardiovascular: Positive for leg swelling. Negative for chest pain and palpitations.  Gastrointestinal: Negative for nausea, vomiting and abdominal pain.  Genitourinary: Negative for dysuria.  Neurological: Positive for weakness.    DRUG ALLERGIES:  No Known Allergies  VITALS:  Blood pressure 102/55, pulse 91, temperature 98.8 F (37.1 C), temperature source Oral, resp. rate 20, height _0  (1.702 m), weight 83.28 kg (183 lb 9.6 oz), SpO2 91 %.  PHYSICAL EXAMINATION:  GENERAL:  81 y.o.-year-old patient lying in the bed uncomfortable LUNGS: Normal breath sounds bilaterally, no wheezing, rales, rhonchi or crepitation. No use of accessory muscles of respiration. Coughing fits during conversation CARDIOVASCULAR: S1, S2 normal. No murmurs, rubs, or gallops.  ABDOMEN: Soft, nontender, distended. Bowel sounds present. No organomegaly or mass.  EXTREMITIES: No pedal edema, cyanosis, or clubbing.  NEUROLOGIC: Cranial nerves II through XII are intact. Muscle strength 4+/5 in all extremities. Sensation intact. Gait not checked.  PSYCHIATRIC: The patient is alert and oriented x 3.  SKIN: No obvious rash, lesion, or ulcer.    LABORATORY PANEL:   CBC  Recent Labs Lab 03/25/15 0506  WBC 31.0*  HGB 8.0*  HCT 25.3*  PLT 181   ------------------------------------------------------------------------------------------------------------------  Chemistries   Recent Labs Lab 03/23/15 2310 03/25/15 0506  NA 142 138  K 4.1 4.3  CL 107 104  CO2 23 28  GLUCOSE 147* 129*  BUN 28* 34*   CREATININE 1.84* 1.93*  CALCIUM 8.9 8.6*  AST 16  --   ALT 10*  --   ALKPHOS 50  --   BILITOT 0.5  --    ------------------------------------------------------------------------------------------------------------------  Cardiac Enzymes  Recent Labs Lab 03/24/15 1607  TROPONINI <0.03   ------------------------------------------------------------------------------------------------------------------  RADIOLOGY:  Dg Thoracic Spine 2 View  03/24/2015  CLINICAL DATA:  Status post fall to floor from chair. Upper back pain. Initial encounter. EXAM: THORACIC SPINE 2 VIEWS COMPARISON:  CT of the chest performed earlier today at 12:07 a.m. FINDINGS: There is no evidence of fracture or subluxation. Vertebral bodies demonstrate normal height and alignment. Intervertebral disc spaces are preserved. Anterior bridging osteophytes are noted along the thoracic spine. The visualized portions of both lungs are clear. The mediastinum is unremarkable in appearance. IMPRESSION: No evidence of fracture or subluxation along the thoracic spine. Electronically Signed   By: Garald Balding M.D.   On: 03/24/2015 00:32   Dg Lumbar Spine 2-3 Views  03/24/2015  CLINICAL DATA:  Status post fall to floor from chair. Lower back pain. Initial encounter. EXAM: LUMBAR SPINE - 2-3 VIEW COMPARISON:  CT of the abdomen and pelvis performed 04/07/2010 FINDINGS: There is no evidence of fracture or subluxation. The anterior and lateral osteophytes are noted along the lumbar spine. Vertebral bodies demonstrate normal height and alignment. Multilevel disc space narrowing is noted along the lumbar spine. The visualized bowel gas pattern is unremarkable in appearance; air and stool are noted within the colon. Sclerotic change is noted at the sacroiliac joints. Scattered vascular calcifications are seen. IMPRESSION: 1. No evidence of fracture or subluxation along the lumbar spine. 2. Mild  diffuse degenerative change along the lumbar  spine. 3. Scattered vascular calcifications seen. Electronically Signed   By: Garald Balding M.D.   On: 03/24/2015 00:35   Ct Head Wo Contrast  03/24/2015  CLINICAL DATA:  Status post fall. Slid out of chair onto floor. Generalized weakness for several weeks. Concern for chest or cervical spine injury. Initial encounter. EXAM: CT HEAD WITHOUT CONTRAST CT CERVICAL SPINE WITHOUT CONTRAST TECHNIQUE: Multidetector CT imaging of the head and cervical spine was performed following the standard protocol without intravenous contrast. Multiplanar CT image reconstructions of the cervical spine were also generated. COMPARISON:  MRI of the brain performed 12/23/2013, and CT of the head performed 03/16/2010. FINDINGS: CT HEAD FINDINGS There is no evidence of acute infarction, mass lesion, or intra- or extra-axial hemorrhage on CT. Prominence of the ventricles and sulci reflects moderate cortical volume loss. Cerebellar atrophy is noted. Mild periventricular and subcortical white matter change likely reflects small vessel ischemic microangiopathy. The brainstem and fourth ventricle are within normal limits. The basal ganglia are unremarkable in appearance. The cerebral hemispheres demonstrate grossly normal gray-white differentiation. No mass effect or midline shift is seen. There is no evidence of fracture; visualized osseous structures are unremarkable in appearance. The orbits are within normal limits. There is near complete opacification of the right side of the sphenoid sinus and right frontal sinus. The remaining paranasal sinuses and mastoid air cells are well-aerated. No significant soft tissue abnormalities are seen. CT CERVICAL SPINE FINDINGS There is no evidence of fracture or subluxation. Vertebral bodies demonstrate normal height. Multilevel disc space narrowing is noted along the cervical spine, with scattered anterior and posterior disc osteophyte complexes. There is minimal grade 1 anterolisthesis of C7 on T1,  reflecting underlying facet disease. Prevertebral soft tissues are within normal limits. The thyroid gland is unremarkable in appearance. The visualized lung apices are clear. Calcification is noted at the left carotid bifurcation. Postoperative change is noted about the right common and internal carotid artery. IMPRESSION: 1. No evidence of traumatic intracranial injury or fracture. 2. No evidence of fracture or subluxation along the cervical spine. 3. Moderate cortical volume loss and scattered small vessel ischemic microangiopathy. 4. Mild degenerative change noted along the cervical spine. 5. Near complete opacification of the right side of the sphenoid sinus and the right frontal sinus. 6. Calcification at the left carotid bifurcation. Electronically Signed   By: Garald Balding M.D.   On: 03/24/2015 00:30   Ct Chest Wo Contrast  03/24/2015  CLINICAL DATA:  Post fall, slipped out of chair onto floor. Weakness. Cough, fever and dyspnea. EXAM: CT CHEST WITHOUT CONTRAST TECHNIQUE: Multidetector CT imaging of the chest was performed following the standard protocol without IV contrast. COMPARISON:  Chest radiographs 03/20/2015 FINDINGS: Multi chamber cardiomegaly. There are mitral annulus calcifications. Coronary artery calcifications are seen. Thoracic aorta is ectatic with mild aneurysmal dilatation of the ascending aorta, 4.2 cm. There is moderate atherosclerotic calcification. No periaortic soft tissue stranding. No mediastinal or evident hilar adenopathy. No pericardial effusion. Diminutive bilateral pleural effusions, left greater than right. Multifocal small pleural based calcifications bilaterally. No consolidation to suggest pneumonia. No pneumothorax or pneumomediastinum. No suspicious pulmonary nodule or mass. No pulmonary edema. Multilevel degenerative change in the thoracic spine without acute fracture or subluxation. No acute rib fracture. Sternum is intact. No acute abnormality in the included  upper abdomen. IMPRESSION: 1. No definite acute abnormality. There are trace bilateral pleural effusions. 2. Multi chamber cardiomegaly with coronary artery calcifications. Ectatic thoracic aorta  with mild aneurysmal dilatation of the ascending portion measuring up to 4.2 cm. Recommend annual imaging followup by CTA or MRA. This recommendation follows 2010 ACCF/AHA/AATS/ACR/ASA/SCA/SCAI/SIR/STS/SVM Guidelines for the Diagnosis and Management of Patients with Thoracic Aortic Disease. Circulation. 2010; 121: B583-E940 3. Multifocal small pleural based calcifications, may reflect pleural plaques and sequela of prior asbestos exposure. Electronically Signed   By: Jeb Levering M.D.   On: 03/24/2015 00:34   Ct Cervical Spine Wo Contrast  03/24/2015  CLINICAL DATA:  Status post fall. Slid out of chair onto floor. Generalized weakness for several weeks. Concern for chest or cervical spine injury. Initial encounter. EXAM: CT HEAD WITHOUT CONTRAST CT CERVICAL SPINE WITHOUT CONTRAST TECHNIQUE: Multidetector CT imaging of the head and cervical spine was performed following the standard protocol without intravenous contrast. Multiplanar CT image reconstructions of the cervical spine were also generated. COMPARISON:  MRI of the brain performed 12/23/2013, and CT of the head performed 03/16/2010. FINDINGS: CT HEAD FINDINGS There is no evidence of acute infarction, mass lesion, or intra- or extra-axial hemorrhage on CT. Prominence of the ventricles and sulci reflects moderate cortical volume loss. Cerebellar atrophy is noted. Mild periventricular and subcortical white matter change likely reflects small vessel ischemic microangiopathy. The brainstem and fourth ventricle are within normal limits. The basal ganglia are unremarkable in appearance. The cerebral hemispheres demonstrate grossly normal gray-white differentiation. No mass effect or midline shift is seen. There is no evidence of fracture; visualized osseous  structures are unremarkable in appearance. The orbits are within normal limits. There is near complete opacification of the right side of the sphenoid sinus and right frontal sinus. The remaining paranasal sinuses and mastoid air cells are well-aerated. No significant soft tissue abnormalities are seen. CT CERVICAL SPINE FINDINGS There is no evidence of fracture or subluxation. Vertebral bodies demonstrate normal height. Multilevel disc space narrowing is noted along the cervical spine, with scattered anterior and posterior disc osteophyte complexes. There is minimal grade 1 anterolisthesis of C7 on T1, reflecting underlying facet disease. Prevertebral soft tissues are within normal limits. The thyroid gland is unremarkable in appearance. The visualized lung apices are clear. Calcification is noted at the left carotid bifurcation. Postoperative change is noted about the right common and internal carotid artery. IMPRESSION: 1. No evidence of traumatic intracranial injury or fracture. 2. No evidence of fracture or subluxation along the cervical spine. 3. Moderate cortical volume loss and scattered small vessel ischemic microangiopathy. 4. Mild degenerative change noted along the cervical spine. 5. Near complete opacification of the right side of the sphenoid sinus and the right frontal sinus. 6. Calcification at the left carotid bifurcation. Electronically Signed   By: Garald Balding M.D.   On: 03/24/2015 00:30    EKG:   Orders placed or performed during the hospital encounter of 03/23/15  . EKG 12-Lead  . EKG 12-Lead    ASSESSMENT AND PLAN:   #1 left neck pain: CT, x-rays showing no fracture. No crepitus or laxity. No deformity. Continue to work with physical therapy. Likely muscular spasm. Possible tendon tear but at this time would not be appropriate to get an MRI. Would follow up. He can use a heating pad and Tylenol for pain. Continue IV morphine as needed for pain relief, have not been able to  control with by mouth medications  #2 chest pain: No chest pain today. No EKG changes. Troponins negative 3. There was some concern for cardiac enlargement on chest CT. He did have an echo in March, and  repeat echocardiogram during this hospitalization is unchanged. 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 Stable. This is the cause of his significant leukocytosis  #4 history of CVA: Continue Aggrenox. No changes  #5 BPH: Continue finasteride and tamsulosin  #6 weakness: Profoundly weak after fall. States that he has bilateral upper extremity pain due to muscle spasm and fatigue. He is requiring significant assistance for ADLs.  All the records are reviewed and case discussed with Care Management/Social Workerr. Management plans discussed with the patient, family and they are in agreement.  CODE STATUS: DO NOT RESUSCITATE  TOTAL TIME TAKING CARE OF THIS PATIENT: 35 minutes.  Greater than 50% of time spent in care coordination and counseling. POSSIBLE D/C IN 1 DAYS, DEPENDING ON CLINICAL CONDITION.   Myrtis Ser M.D on 03/25/2015 at 3:46 PM  Between 7am to 6pm - Pager - [phone]  After 6pm go to [email or web address] - password [email or web address] [facility]  Office  [phone]  CC: Primary care physician; Lavera Guise, MD

## 2015-03-26 ENCOUNTER — Inpatient Hospital Stay: Payer: Medicare Other

## 2015-03-26 LAB — BASIC METABOLIC PANEL
ANION GAP: 8 (ref 5–15)
BUN: 44 mg/dL — ABNORMAL HIGH (ref 6–20)
CHLORIDE: 104 mmol/L (ref 101–111)
CO2: 25 mmol/L (ref 22–32)
Calcium: 8.5 mg/dL — ABNORMAL LOW (ref 8.9–10.3)
Creatinine, Ser: 2.18 mg/dL — ABNORMAL HIGH (ref 0.61–1.24)
GFR calc non Af Amer: 24 mL/min — ABNORMAL LOW (ref >60)
GFR, EST AFRICAN AMERICAN: 28 mL/min — AB (ref >60)
Glucose, Bld: 136 mg/dL — ABNORMAL HIGH (ref 65–99)
POTASSIUM: 4.1 mmol/L (ref 3.5–5.1)
SODIUM: 137 mmol/L (ref 135–145)

## 2015-03-26 LAB — CBC
HEMATOCRIT: 24.3 % — AB (ref 40.0–52.0)
HEMOGLOBIN: 7.8 g/dL — AB (ref 13.0–18.0)
MCH: 29.6 pg (ref 26.0–34.0)
MCHC: 32 g/dL (ref 32.0–36.0)
MCV: 92.7 fL (ref 80.0–100.0)
Platelets: 175 10*3/uL (ref 150–440)
RBC: 2.62 MIL/uL — AB (ref 4.40–5.90)
RDW: 14.4 % (ref 11.5–14.5)
WBC: 34.5 10*3/uL — AB (ref 3.8–10.6)

## 2015-03-26 MED ORDER — POLYETHYLENE GLYCOL 3350 17 G PO PACK
17.0000 g | PACK | Freq: Two times a day (BID) | ORAL | Status: DC
  Administered 2015-03-27 – 2015-03-28 (×3): 17 g via ORAL
  Filled 2015-03-26 (×3): qty 1

## 2015-03-26 MED ORDER — HYDROCODONE-ACETAMINOPHEN 5-325 MG PO TABS
1.0000 | ORAL_TABLET | ORAL | Status: DC | PRN
  Administered 2015-03-26: 1 via ORAL
  Filled 2015-03-26: qty 1

## 2015-03-26 MED ORDER — SODIUM CHLORIDE 0.9 % IV SOLN
INTRAVENOUS | Status: DC
  Administered 2015-03-26: 16:00:00 via INTRAVENOUS

## 2015-03-26 MED ORDER — IPRATROPIUM-ALBUTEROL 0.5-2.5 (3) MG/3ML IN SOLN: 3.0000 mL | Freq: Four times a day (QID) | RESPIRATORY_TRACT | Status: DC | PRN

## 2015-03-26 MED ORDER — HYDROCOD POLST-CPM POLST ER 10-8 MG/5ML PO SUER
5.0000 mL | Freq: Two times a day (BID) | ORAL | Status: DC | PRN
  Administered 2015-03-27: 5 mL via ORAL
  Filled 2015-03-26 (×2): qty 5

## 2015-03-26 NOTE — Progress Notes (Signed)
Pt alert and oriented, xray completed today, pt did not tolerate well, complained of neck and back pain. Medication given immediately after with mild relief. Will continue to monitor.

## 2015-03-26 NOTE — Progress Notes (Signed)
[facility] in [place], [place] previously called from bed control to advise that they have a bed available for pt transfer, need to contact attending physician today, Dr. Gwendolyn Fill and speak to the pt and the pt's family, after contact call Jeneen Rinks at the [name] back at [phone] ext [phone]; spoke to Dr Volanda Napoleon via telephone call and advised her of previous documentation, while Dr Volanda Napoleon was on hold, went into the pt's room where the pt and his family were at the bedside, advised him that the [facility] was calling with an open bed, was he still interested in transferring to their facility, the pt verbally responded "no" and the family nodded their heads in agreement; advised Dr Volanda Napoleon via telephone call that the pt was not interested in transferring to the [facility]; called Jeneen Rinks at the [facility] at [phone] ext [phone] advised him of the pt's wishes and that Dr Volanda Napoleon his attending physician has been made aware; no further action taken at this time

## 2015-03-26 NOTE — Progress Notes (Addendum)
[facility] [place] NAME: Lucas Newman    MR#:  [id number]  DATE OF BIRTH:  1919-09-11  SUBJECTIVE:  CHIEF COMPLAINT:   Chief Complaint  Patient presents with  . Fall   Continues to have cough which has kept him awake most of the night. Has right arm and shoulder pain today.  REVIEW OF SYSTEMS:   Review of Systems  Constitutional: Negative for fever.  Respiratory: Negative for shortness of breath.   Cardiovascular: Positive for leg swelling. Negative for chest pain and palpitations.  Gastrointestinal: Negative for nausea, vomiting and abdominal pain.  Genitourinary: Negative for dysuria.  Neurological: Positive for weakness.    DRUG ALLERGIES:  No Known Allergies  VITALS:  Blood pressure 121/57, pulse 91, temperature 97.8 F (36.6 C), temperature source Oral, resp. rate 20, height _0  (1.702 m), weight 83.122 kg (183 lb 4 oz), SpO2 91 %.  PHYSICAL EXAMINATION:  GENERAL:  81 y.o.-year-old patient lying in the bed uncomfortable LUNGS: Normal breath sounds bilaterally, no wheezing, rales, rhonchi or crepitation. No use of accessory muscles of respiration. Coughing fits during conversation CARDIOVASCULAR: S1, S2 normal. No murmurs, rubs, or gallops.  ABDOMEN: Soft, nontender, distended. Bowel sounds present. No organomegaly or mass.  EXTREMITIES: No pedal edema, cyanosis, or clubbing.  NEUROLOGIC: Cranial nerves II through XII are intact. Muscle strength 4+/5 in all extremities. Sensation intact. Gait not checked.  PSYCHIATRIC: The patient is alert and oriented x 3.  SKIN: No obvious rash, lesion, or ulcer.    LABORATORY PANEL:   CBC  Recent Labs Lab 03/26/15 0418  WBC 34.5*  HGB 7.8*  HCT 24.3*  PLT 175   ------------------------------------------------------------------------------------------------------------------  Chemistries   Recent Labs Lab 03/23/15 2310  03/26/15 0418  NA 142  < > 137  K  4.1  < > 4.1  CL 107  < > 104  CO2 23  < > 25  GLUCOSE 147*  < > 136*  BUN 28*  < > 44*  CREATININE 1.84*  < > 2.18*  CALCIUM 8.9  < > 8.5*  AST 16  --   --   ALT 10*  --   --   ALKPHOS 50  --   --   BILITOT 0.5  --   --   < > = values in this interval not displayed. ------------------------------------------------------------------------------------------------------------------  Cardiac Enzymes  Recent Labs Lab 03/24/15 1607  TROPONINI <0.03   ------------------------------------------------------------------------------------------------------------------  RADIOLOGY:  Dg Shoulder Right Port  03/26/2015  CLINICAL DATA:  Right shoulder pain, no recent injury EXAM: PORTABLE RIGHT SHOULDER - 2+ VIEW COMPARISON:  None. FINDINGS: Degenerative change in spurring in the [facility] joint. Glenohumeral joint normal. Normal alignment no fracture. IMPRESSION: AC degenerative change.  No acute skeletal abnormality. Electronically Signed   By: Franchot Gallo M.D.   On: 03/26/2015 13:12    EKG:   Orders placed or performed during the hospital encounter of 03/23/15  . EKG 12-Lead  . EKG 12-Lead    ASSESSMENT AND PLAN:   #1 left and right neck pain:  - Pain started after prolonged episode of trying to get out of a chair and then falling - Admission x-ray of lumbar spine, thoracic spine, CT of cervical spine, chest and head show no fractures - Repeat x-ray of right shoulder shows arthritis - Continue with Tylenol, Flexeril, morphine as needed. Will add Vicodin for longer acting relief - Continue physical therapy  #2 chest pain: No chest pain  at this time. No EKG changes. Troponins negative 3. There was some concern for cardiac enlargement on chest CT. He did have an echo in March, and repeat echocardiogram during this hospitalization is unchanged. 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 He does have an  increasing leukocytosis and worsening anemia. It is possible that the CML is related to his chronic cough without any other etiology identified. Consult oncology for further recommendations  #4 history of CVA: Continue Aggrenox. No changes  #5 BPH: Continue finasteride and tamsulosin  #6 weakness: Profoundly weak after fall. States that he has bilateral upper extremity pain due to muscle spasm and fatigue. He is requiring significant assistance for ADLs. We'll required skilled nursing  #7 cough: Start Tussionex  #7 acute on chronic kidney disease: Creatinine increased today. Start gentle hydration  All the records are reviewed and case discussed with Care Management/Social Workerr. Management plans discussed with the patient, family and they are in agreement.  CODE STATUS: DO NOT RESUSCITATE  TOTAL TIME TAKING CARE OF THIS PATIENT: 35 minutes.  Greater than 50% of time spent in care coordination and counseling. POSSIBLE D/C IN 1 DAYS, DEPENDING ON CLINICAL CONDITION.   Myrtis Ser M.D on 03/26/2015 at 3:07 PM  Between 7am to 6pm - Pager - [phone]  After 6pm go to [email or web address] - password [email or web address] [facility]  Office  [phone]  CC: Primary care physician; Lavera Guise, MD

## 2015-03-26 NOTE — Plan of Care (Signed)
Problem: Pain Managment: Goal: General experience of comfort will improve Outcome: Progressing Nonproductive cough- Robitussin prn given C/o neck and back pain- Morphine prn given Safety: high fall risk- Assisted with urinal at bedside. Bed alarm activated. Room close to nurses station Hemodynamics- vital signs stable. Will continue to monitor.  Restless at times declined antianxiety medication stated " I'm already on enough medicine".

## 2015-03-27 DIAGNOSIS — D72829 Elevated white blood cell count, unspecified: Secondary | ICD-10-CM

## 2015-03-27 DIAGNOSIS — Z79899 Other long term (current) drug therapy: Secondary | ICD-10-CM

## 2015-03-27 DIAGNOSIS — Z87891 Personal history of nicotine dependence: Secondary | ICD-10-CM

## 2015-03-27 DIAGNOSIS — D631 Anemia in chronic kidney disease: Secondary | ICD-10-CM

## 2015-03-27 DIAGNOSIS — N189 Chronic kidney disease, unspecified: Secondary | ICD-10-CM

## 2015-03-27 DIAGNOSIS — C9310 Chronic myelomonocytic leukemia not having achieved remission: Secondary | ICD-10-CM

## 2015-03-27 LAB — CBC
HCT: 23.9 % — ABNORMAL LOW (ref 40.0–52.0)
HEMOGLOBIN: 7.6 g/dL — AB (ref 13.0–18.0)
MCH: 29.6 pg (ref 26.0–34.0)
MCHC: 32.1 g/dL (ref 32.0–36.0)
MCV: 92.4 fL (ref 80.0–100.0)
PLATELETS: 172 10*3/uL (ref 150–440)
RBC: 2.58 MIL/uL — AB (ref 4.40–5.90)
RDW: 14.1 % (ref 11.5–14.5)
WBC: 32.5 10*3/uL — AB (ref 3.8–10.6)

## 2015-03-27 LAB — BASIC METABOLIC PANEL
Anion gap: 8 (ref 5–15)
BUN: 53 mg/dL — AB (ref 6–20)
CHLORIDE: 105 mmol/L (ref 101–111)
CO2: 26 mmol/L (ref 22–32)
CREATININE: 2.09 mg/dL — AB (ref 0.61–1.24)
Calcium: 8.5 mg/dL — ABNORMAL LOW (ref 8.9–10.3)
GFR, EST AFRICAN AMERICAN: 30 mL/min — AB (ref >60)
GFR, EST NON AFRICAN AMERICAN: 26 mL/min — AB (ref >60)
Glucose, Bld: 156 mg/dL — ABNORMAL HIGH (ref 65–99)
POTASSIUM: 3.8 mmol/L (ref 3.5–5.1)
SODIUM: 139 mmol/L (ref 135–145)

## 2015-03-27 LAB — ABO/RH: ABO/RH(D): A POS

## 2015-03-27 LAB — PREPARE RBC (CROSSMATCH)

## 2015-03-27 MED ORDER — ENOXAPARIN SODIUM 30 MG/0.3ML ~~LOC~~ SOLN: 30.0000 mg | SUBCUTANEOUS | Status: DC

## 2015-03-27 MED ORDER — SODIUM CHLORIDE 0.9 % IV SOLN
Freq: Once | INTRAVENOUS | Status: AC
  Administered 2015-03-27: 13:00:00 via INTRAVENOUS

## 2015-03-27 NOTE — Care Management Important Message (Signed)
Important Message  Patient Details  Name: Lucas Newman MRN: [id number] Date of Birth: 1919/05/17   Medicare Important Message Given:  Yes    [name] 03/27/2015, 11:06 AM

## 2015-03-27 NOTE — Clinical Social Work Note (Signed)
CSW updated pt and pt's daughter about CSW securing a bed at [facility], which will be available at discharge. Pt will likely discharge tomorrow. CSW will continue to follow.   Darden Dates, MSW, LCSW Clinical Social Worker  [phone]

## 2015-03-27 NOTE — Progress Notes (Signed)
Physical Therapy Treatment Patient Details Name: Lucas Newman MRN: [id number] DOB: October 26, 1919 Today's Date: 03/27/2015    History of Present Illness Pt is a 81 yo male who was came to the hospital after a mechanical fall resulting in left shoulder and neck pain    PT Comments    Pt is making good progress towards goals with increased endurance noted this date. Pt still fatigues quickly and requires chair follow. O2 sats remained >90% on room air with exertion. Pt motivated to participate in therapy.  Follow Up Recommendations  SNF     Equipment Recommendations  None recommended by PT    Recommendations for Other Services       Precautions / Restrictions Precautions Precautions: Fall Restrictions Weight Bearing Restrictions: No    Mobility  Bed Mobility               General bed mobility comments: not performed as pt in recliner upon arrival  Transfers Overall transfer level: Needs assistance Equipment used: Rolling walker (2 wheeled) Transfers: Sit to/from Stand Sit to Stand: Mod assist         General transfer comment: Pt requires cues to push from seated surface prior to standing. Once standing, pt requires constant min assist to maintain standing balance secondary to post leaning.  Ambulation/Gait Ambulation/Gait assistance: Min assist Ambulation Distance (Feet): 60 Feet Assistive device: Rolling walker (2 wheeled) Gait Pattern/deviations: Step-through pattern;Shuffle Gait velocity: decreased   General Gait Details: slightly increased speed for ambulation using rw and chair follow as he quickly fatigues and needs seated rest break post ambulation. Cues for increased R step length.    Stairs            Wheelchair Mobility    Modified Rankin (Stroke Patients Only)       Balance                                    Cognition Arousal/Alertness: Awake/alert Behavior During Therapy: WFL for tasks  assessed/performed Overall Cognitive Status: Within Functional Limits for tasks assessed                      Exercises Other Exercises Other Exercises: Seated ther-ex performed including hip abd/add, ankle pumps, hip add squeezes, alt. LAQ, and alt seated marching. All ther-ex performed x 15 reps with min assist. Other Exercises: Pt also ambulated to [facility] requiring min assist to reach back and sit on BSC. Pt then ambulated back to recliner prior to further ambulation. NA notified.    General Comments        Pertinent Vitals/Pain Pain Assessment: No/denies pain    Home Living                      Prior Function            PT Goals (current goals can now be found in the care plan section) Acute Rehab PT Goals Patient Stated Goal: none stated PT Goal Formulation: With patient Time For Goal Achievement: 04/07/15 Potential to Achieve Goals: Good Progress towards PT goals: Progressing toward goals    Frequency  Min 2X/week    PT Plan Current plan remains appropriate    Co-evaluation             End of Session Equipment Utilized During Treatment: Gait belt Activity Tolerance: Patient limited by fatigue Patient left: in chair;with call  bell/phone within reach;with chair alarm set     Time: [phone] PT Time Calculation (min) (ACUTE ONLY): 23 min  Charges:  $Gait Training: 8-22 mins $Therapeutic Exercise: 8-22 mins                    G Codes:      Redell Bhandari 2015-04-09, 10:10 AM  Greggory Stallion, PT, DPT [phone]

## 2015-03-27 NOTE — Consult Note (Addendum)
[place] NOTE  Patient Care Team: Lavera Guise, MD as PCP - General (Internal Medicine)  CHIEF COMPLAINTS/PURPOSE OF CONSULTATION:  Chronic leukocytosis-history of leukemia/worsening anemia.   HISTORY OF PRESENTING ILLNESS:  Lucas Newman 81 y.o.  male Caucasian male patient who is fairly good historian for his age. Patient states that he had a fall which was mainly mechanical home. However on admission to hospital- his noted to have elevated white count of 30,000; hemoglobin 7.8 platelets within normal limits. He also has chronic renal insufficiency with a creatinine around 2. hematology has been consulted regarding his worsening anemia/ and also his history of leukemia.  Patient states that he had a bone marrow biopsy approximately 6 months ago- and showed "low-grade leukemia"- for which no treatment was recommended. He has not been followed in the [place] since then. Patient denies having any frequent blood transfusions.  Patient denies any significant weight loss. Denies any lumps or bumps. Denies any night sweats. Denies any high-grade fevers. Patient comes of fatigue/intermittent cough  Patient goes around with a walker. He is currently living at home. His wife is currently in rehabilitation.   ROS: A complete 10 point review of system is done which is negative except mentioned above in history of present illness  MEDICAL HISTORY:  Past Medical History  Diagnosis Date  . Hyperlipidemia   . GERD (gastroesophageal reflux disease)   . Arthritis   . CVA (cerebral infarction)   . CML (chronic myelocytic leukemia) ([place])     SURGICAL HISTORY: Past Surgical History  Procedure Laterality Date  . Appendectomy    . Replacement total knee bilateral  2002/2004  . Carotid endarterectomy Left     SOCIAL HISTORY: Social History   Social History  . Marital Status: Married    Spouse Name: N/A  . Number of Children: N/A  . Years of Education: N/A    Occupational History  . Not on file.   Social History Main Topics  . Smoking status: Former Smoker -- 0.00 packs/day for 20 years    Quit date: 05/11/1984  . Smokeless tobacco: Never Used  . Alcohol Use: No     Comment: occasional beer/bloody mary  . Drug Use: No  . Sexual Activity: Not on file   Other Topics Concern  . Not on file   Social History Narrative    FAMILY HISTORY: Family History  Problem Relation Age of Onset  . Lung cancer Brother   . Diabetes type II Mother   . Hypertension Father     ALLERGIES:  has No Known Allergies.  MEDICATIONS:  Current Facility-Administered Medications  Medication Dose Route Frequency Provider Last Rate Last Dose  . 0.9 %  sodium chloride infusion   Intravenous Continuous Aldean Jewett, MD 50 mL/hr at 03/27/15 [phone]    . 0.9 %  sodium chloride infusion   Intravenous Once Aldean Jewett, MD      . acetaminophen (TYLENOL) tablet 650 mg  650 mg Oral Q6H PRN Harrie Foreman, MD       Or  . acetaminophen (TYLENOL) suppository 650 mg  650 mg Rectal Q6H PRN Harrie Foreman, MD      . calcium-vitamin D (OSCAL WITH D) 500-200 MG-UNIT per tablet 1 tablet  1 tablet Oral Daily Harrie Foreman, MD   1 tablet at 03/26/15 [phone]  . carvedilol (COREG) tablet 6.25 mg  6.25 mg Oral BID WC Harrie Foreman, MD   6.25 mg  at 03/26/15 1736  . chlorpheniramine-HYDROcodone (TUSSIONEX) 10-8 MG/5ML suspension 5 mL  5 mL Oral Q12H PRN Aldean Jewett, MD      . cyclobenzaprine (FLEXERIL) tablet 5 mg  5 mg Oral TID PRN Aldean Jewett, MD   5 mg at 03/26/15 0947  . diphenhydrAMINE (BENADRYL) capsule 25 mg  25 mg Oral QHS PRN Lytle Butte, MD   25 mg at 03/25/15 1819  . dipyridamole-aspirin (AGGRENOX) 200-25 MG per 12 hr capsule 1 capsule  1 capsule Oral BID Harrie Foreman, MD   1 capsule at 03/26/15 2114  . docusate sodium (COLACE) capsule 100 mg  100 mg Oral BID Harrie Foreman, MD   100 mg at 03/26/15 2114  . finasteride (PROSCAR)  tablet 5 mg  5 mg Oral Daily Harrie Foreman, MD   5 mg at 03/26/15 0947  . fluticasone (FLONASE) 50 MCG/ACT nasal spray 1 spray  1 spray Each Nare BID PRN Harrie Foreman, MD      . guaiFENesin (ROBITUSSIN) 100 MG/5ML solution 200 mg  10 mL Oral Q4H PRN Lytle Butte, MD   200 mg at 03/26/15 7902  . heparin injection 5,000 Units  5,000 Units Subcutaneous 3 times per day Harrie Foreman, MD   5,000 Units at 03/27/15 0511  . HYDROcodone-acetaminophen (NORCO/VICODIN) 5-325 MG per tablet 1 tablet  1 tablet Oral Q4H PRN Aldean Jewett, MD   1 tablet at 03/26/15 1620  . ipratropium-albuterol (DUONEB) 0.5-2.5 (3) MG/3ML nebulizer solution 3 mL  3 mL Nebulization Q6H PRN Aldean Jewett, MD      . levofloxacin [facility]) tablet 250 mg  250 mg Oral Daily Aldean Jewett, MD   250 mg at 03/26/15 1620  . loratadine (CLARITIN) tablet 10 mg  10 mg Oral Daily Harrie Foreman, MD   10 mg at 03/26/15 0946  . magnesium oxide (MAG-OX) tablet 400 mg  400 mg Oral Daily Harrie Foreman, MD   400 mg at 03/26/15 0947  . morphine 2 MG/ML injection 1 mg  1 mg Intravenous Q3H PRN Harrie Foreman, MD   1 mg at 03/26/15 1207  . multivitamin with minerals tablet 1 tablet  1 tablet Oral Daily Harrie Foreman, MD   1 tablet at 03/26/15 [phone]  . ondansetron (ZOFRAN) tablet 4 mg  4 mg Oral Q6H PRN Harrie Foreman, MD       Or  . ondansetron [facility]) injection 4 mg  4 mg Intravenous Q6H PRN Harrie Foreman, MD      . pantoprazole (PROTONIX) EC tablet 40 mg  40 mg Oral Daily Harrie Foreman, MD   40 mg at 03/27/15 0357  . polyethylene glycol (MIRALAX / GLYCOLAX) packet 17 g  17 g Oral BID Aldean Jewett, MD   17 g at 03/26/15 2117  . predniSONE (DELTASONE) tablet 10 mg  10 mg Oral Daily Aldean Jewett, MD   10 mg at 03/26/15 0947  . senna-docusate (Senokot-S) tablet 1 tablet  1 tablet Oral QHS Harrie Foreman, MD   1 tablet at 03/26/15 2114  . simvastatin (ZOCOR) tablet 20 mg  20 mg Oral QHS  Harrie Foreman, MD   20 mg at 03/26/15 2114  . sodium chloride 0.9 % injection 3 mL  3 mL Intravenous Q12H Harrie Foreman, MD   3 mL at 03/26/15 0954  . tamsulosin (FLOMAX) capsule 0.4 mg  0.4 mg Oral QHS Legrand Como  Tinnie Gens, MD   0.4 mg at 03/26/15 2114  . zinc sulfate capsule 220 mg  220 mg Oral Daily Harrie Foreman, MD   220 mg at 03/26/15 0947      .  PHYSICAL EXAMINATION:   Filed Vitals:   03/26/15 2108 03/27/15 0511  BP: 117/59 127/60  Pulse: 71 82  Temp: 98 F (36.7 C) 98.4 F (36.9 C)  Resp: 17 18   Filed Weights   03/25/15 0533 03/26/15 0635 03/27/15 0500  Weight: 183 lb 9.6 oz (83.28 kg) 183 lb 4 oz (83.122 kg) 186 lb (84.369 kg)    GENERAL: Moderately built moderately nourished elderly male patient Alert, no distress and comfortable. His sitting in his chair.   EYES:Positive for pallor. No icterus  OROPHARYNX: no thrush or ulceration; good dentition  NECK: supple, no masses felt LYMPH:  no palpable lymphadenopathy in the cervical, axillary or inguinal regions LUNGS: clear to auscultation and  No wheeze or crackles HEART/CVS: regular rate & rhythm and no murmurs; No lower extremity edema ABDOMEN: abdomen soft, non-tender and normal bowel sounds Musculoskeletal:no cyanosis of digits and no clubbing  PSYCH: alert & oriented x 3 with fluent speech NEURO: no focal motor/sensory deficits SKIN:  no rashes or significant lesions  LABORATORY DATA:  I have reviewed the data as listed Lab Results  Component Value Date   WBC 32.5* 03/27/2015   HGB 7.6* 03/27/2015   HCT 23.9* 03/27/2015   MCV 92.4 03/27/2015   PLT 172 03/27/2015    Recent Labs  09/04/14 1200  03/20/15 1553 03/23/15 2310 03/25/15 0506 03/26/15 0418 03/27/15 0743  NA 138  < > 139 142 138 137 139  K 4.0  < > 4.3 4.1 4.3 4.1 3.8  CL 102  < > 107 107 104 104 105  CO2 27  < > _0 GLUCOSE 180*  < > 131* 147* 129* 136* 156*  BUN 31*  < > 35* 28* 34* 44* 53*  CREATININE 1.78*  <  > 2.18* 1.84* 1.93* 2.18* 2.09*  CALCIUM 8.8*  < > 9.0 8.9 8.6* 8.5* 8.5*  GFRNONAA 31*  < > 24* 30* 28* 24* 26*  GFRAA 36*  < > 28* 35* 33* 28* 30*  PROT 7.3  --  7.3 7.2  --   --   --   ALBUMIN 3.5  --  3.7 3.2*  --   --   --   AST 19  --  16 16  --   --   --   ALT 12*  --  13* 10*  --   --   --   ALKPHOS 46  --  51 50  --   --   --   BILITOT 0.6  --  0.6 0.5  --   --   --   < > = values in this interval not displayed.  RADIOGRAPHIC STUDIES: I have personally reviewed the radiological images as listed and agreed with the findings in the report. Dg Chest 2 View  03/20/2015  CLINICAL DATA:  Three-week history of productive cough. EXAM: CHEST  2 VIEW COMPARISON:  09/07/2014 FINDINGS: The cardiac silhouette, mediastinal and hilar contours are within normal limits and stable for age. Moderate tortuosity and calcification of the thoracic aorta. The lungs are clear of acute process. No pleural effusion. No pneumothorax. The bony thorax is intact. IMPRESSION: No acute cardiopulmonary findings.  Chronic lung changes. Electronically Signed   By: Mamie Nick.  Gallerani M.D.   On: 03/20/2015 16:08   Dg Thoracic Spine 2 View  03/24/2015  CLINICAL DATA:  Status post fall to floor from chair. Upper back pain. Initial encounter. EXAM: THORACIC SPINE 2 VIEWS COMPARISON:  CT of the chest performed earlier today at 12:07 a.m. FINDINGS: There is no evidence of fracture or subluxation. Vertebral bodies demonstrate normal height and alignment. Intervertebral disc spaces are preserved. Anterior bridging osteophytes are noted along the thoracic spine. The visualized portions of both lungs are clear. The mediastinum is unremarkable in appearance. IMPRESSION: No evidence of fracture or subluxation along the thoracic spine. Electronically Signed   By: Garald Balding M.D.   On: 03/24/2015 00:32   Dg Lumbar Spine 2-3 Views  03/24/2015  CLINICAL DATA:  Status post fall to floor from chair. Lower back pain. Initial encounter.  EXAM: LUMBAR SPINE - 2-3 VIEW COMPARISON:  CT of the abdomen and pelvis performed 04/07/2010 FINDINGS: There is no evidence of fracture or subluxation. The anterior and lateral osteophytes are noted along the lumbar spine. Vertebral bodies demonstrate normal height and alignment. Multilevel disc space narrowing is noted along the lumbar spine. The visualized bowel gas pattern is unremarkable in appearance; air and stool are noted within the colon. Sclerotic change is noted at the sacroiliac joints. Scattered vascular calcifications are seen. IMPRESSION: 1. No evidence of fracture or subluxation along the lumbar spine. 2. Mild diffuse degenerative change along the lumbar spine. 3. Scattered vascular calcifications seen. Electronically Signed   By: Garald Balding M.D.   On: 03/24/2015 00:35   Ct Head Wo Contrast  03/24/2015  CLINICAL DATA:  Status post fall. Slid out of chair onto floor. Generalized weakness for several weeks. Concern for chest or cervical spine injury. Initial encounter. EXAM: CT HEAD WITHOUT CONTRAST CT CERVICAL SPINE WITHOUT CONTRAST TECHNIQUE: Multidetector CT imaging of the head and cervical spine was performed following the standard protocol without intravenous contrast. Multiplanar CT image reconstructions of the cervical spine were also generated. COMPARISON:  MRI of the brain performed 12/23/2013, and CT of the head performed 03/16/2010. FINDINGS: CT HEAD FINDINGS There is no evidence of acute infarction, mass lesion, or intra- or extra-axial hemorrhage on CT. Prominence of the ventricles and sulci reflects moderate cortical volume loss. Cerebellar atrophy is noted. Mild periventricular and subcortical white matter change likely reflects small vessel ischemic microangiopathy. The brainstem and fourth ventricle are within normal limits. The basal ganglia are unremarkable in appearance. The cerebral hemispheres demonstrate grossly normal gray-white differentiation. No mass effect or midline  shift is seen. There is no evidence of fracture; visualized osseous structures are unremarkable in appearance. The orbits are within normal limits. There is near complete opacification of the right side of the sphenoid sinus and right frontal sinus. The remaining paranasal sinuses and mastoid air cells are well-aerated. No significant soft tissue abnormalities are seen. CT CERVICAL SPINE FINDINGS There is no evidence of fracture or subluxation. Vertebral bodies demonstrate normal height. Multilevel disc space narrowing is noted along the cervical spine, with scattered anterior and posterior disc osteophyte complexes. There is minimal grade 1 anterolisthesis of C7 on T1, reflecting underlying facet disease. Prevertebral soft tissues are within normal limits. The thyroid gland is unremarkable in appearance. The visualized lung apices are clear. Calcification is noted at the left carotid bifurcation. Postoperative change is noted about the right common and internal carotid artery. IMPRESSION: 1. No evidence of traumatic intracranial injury or fracture. 2. No evidence of fracture or subluxation along the cervical  spine. 3. Moderate cortical volume loss and scattered small vessel ischemic microangiopathy. 4. Mild degenerative change noted along the cervical spine. 5. Near complete opacification of the right side of the sphenoid sinus and the right frontal sinus. 6. Calcification at the left carotid bifurcation. Electronically Signed   By: Garald Balding M.D.   On: 03/24/2015 00:30   Ct Chest Wo Contrast  03/24/2015  CLINICAL DATA:  Post fall, slipped out of chair onto floor. Weakness. Cough, fever and dyspnea. EXAM: CT CHEST WITHOUT CONTRAST TECHNIQUE: Multidetector CT imaging of the chest was performed following the standard protocol without IV contrast. COMPARISON:  Chest radiographs 03/20/2015 FINDINGS: Multi chamber cardiomegaly. There are mitral annulus calcifications. Coronary artery calcifications are seen.  Thoracic aorta is ectatic with mild aneurysmal dilatation of the ascending aorta, 4.2 cm. There is moderate atherosclerotic calcification. No periaortic soft tissue stranding. No mediastinal or evident hilar adenopathy. No pericardial effusion. Diminutive bilateral pleural effusions, left greater than right. Multifocal small pleural based calcifications bilaterally. No consolidation to suggest pneumonia. No pneumothorax or pneumomediastinum. No suspicious pulmonary nodule or mass. No pulmonary edema. Multilevel degenerative change in the thoracic spine without acute fracture or subluxation. No acute rib fracture. Sternum is intact. No acute abnormality in the included upper abdomen. IMPRESSION: 1. No definite acute abnormality. There are trace bilateral pleural effusions. 2. Multi chamber cardiomegaly with coronary artery calcifications. Ectatic thoracic aorta with mild aneurysmal dilatation of the ascending portion measuring up to 4.2 cm. Recommend annual imaging followup by CTA or MRA. This recommendation follows 2010 ACCF/AHA/AATS/ACR/ASA/SCA/SCAI/SIR/STS/SVM Guidelines for the Diagnosis and Management of Patients with Thoracic Aortic Disease. Circulation. 2010; 121: I297-L892 3. Multifocal small pleural based calcifications, may reflect pleural plaques and sequela of prior asbestos exposure. Electronically Signed   By: Jeb Levering M.D.   On: 03/24/2015 00:34   Ct Cervical Spine Wo Contrast  03/24/2015  CLINICAL DATA:  Status post fall. Slid out of chair onto floor. Generalized weakness for several weeks. Concern for chest or cervical spine injury. Initial encounter. EXAM: CT HEAD WITHOUT CONTRAST CT CERVICAL SPINE WITHOUT CONTRAST TECHNIQUE: Multidetector CT imaging of the head and cervical spine was performed following the standard protocol without intravenous contrast. Multiplanar CT image reconstructions of the cervical spine were also generated. COMPARISON:  MRI of the brain performed 12/23/2013,  and CT of the head performed 03/16/2010. FINDINGS: CT HEAD FINDINGS There is no evidence of acute infarction, mass lesion, or intra- or extra-axial hemorrhage on CT. Prominence of the ventricles and sulci reflects moderate cortical volume loss. Cerebellar atrophy is noted. Mild periventricular and subcortical white matter change likely reflects small vessel ischemic microangiopathy. The brainstem and fourth ventricle are within normal limits. The basal ganglia are unremarkable in appearance. The cerebral hemispheres demonstrate grossly normal gray-white differentiation. No mass effect or midline shift is seen. There is no evidence of fracture; visualized osseous structures are unremarkable in appearance. The orbits are within normal limits. There is near complete opacification of the right side of the sphenoid sinus and right frontal sinus. The remaining paranasal sinuses and mastoid air cells are well-aerated. No significant soft tissue abnormalities are seen. CT CERVICAL SPINE FINDINGS There is no evidence of fracture or subluxation. Vertebral bodies demonstrate normal height. Multilevel disc space narrowing is noted along the cervical spine, with scattered anterior and posterior disc osteophyte complexes. There is minimal grade 1 anterolisthesis of C7 on T1, reflecting underlying facet disease. Prevertebral soft tissues are within normal limits. The thyroid gland is unremarkable in appearance.  The visualized lung apices are clear. Calcification is noted at the left carotid bifurcation. Postoperative change is noted about the right common and internal carotid artery. IMPRESSION: 1. No evidence of traumatic intracranial injury or fracture. 2. No evidence of fracture or subluxation along the cervical spine. 3. Moderate cortical volume loss and scattered small vessel ischemic microangiopathy. 4. Mild degenerative change noted along the cervical spine. 5. Near complete opacification of the right side of the sphenoid  sinus and the right frontal sinus. 6. Calcification at the left carotid bifurcation. Electronically Signed   By: Garald Balding M.D.   On: 03/24/2015 00:30   Dg Shoulder Right Port  03/26/2015  CLINICAL DATA:  Right shoulder pain, no recent injury EXAM: PORTABLE RIGHT SHOULDER - 2+ VIEW COMPARISON:  None. FINDINGS: Degenerative change in spurring in the [facility] joint. Glenohumeral joint normal. Normal alignment no fracture. IMPRESSION: AC degenerative change.  No acute skeletal abnormality. Electronically Signed   By: Franchot Gallo M.D.   On: 03/26/2015 13:12    ASSESSMENT & PLAN:   81 year old male patient with a history of chronic leukocytosis/chronic kidney disease currently admitted to the hospital status post fall.  # Anemia-7.6- 8.0; likely related to underlying bone marrow disease/chronic kidney disease.  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 Clinically less likely to have any progressive transformation to acute leukemia. I will review the records from Dr. Beverly Gust office/bone marrow biopsy report when available.  Recommendations:  # Patient appears to be symptomatic from his anemia- I would recommend 2 units of PRBC transfusion/non-irradiated.  # With regards to leukocytosis- I would recommend a flow cytometry to rule out any transformation into aggressive leukemia.   # The above plan of care was discussed with Dr. Volanda Napoleon.  # Reviewed the chest CT independently- no acute process like pneumonia    Thank you Dr. Volanda Napoleon for me to participate in the care of your pleasant patient. Will follow the patient.       Cammie Sickle, MD 03/27/2015 9:44 AM    Addendum:  On review of the bone marrow biopsy done on 07/05/2014- myelodysplastic syndrome-myelomonocytic leukemia-1. Bone marrow-hypercellular for age approximately 60% cellularity; moderate marrow monocytosis with significant multilineage displaces mildly increased  blasts~5%. Small clone of monoclonal B-cell population. No increase in marrow reticulum fibers.

## 2015-03-27 NOTE — Progress Notes (Signed)
   03/27/15 1030  Clinical Encounter Type  Visited With Patient  Visit Type Initial  Attempted to visit with patient, but patient appeared to be sleeping.  Did not respond when I entered the room and said his name.  Will try to come back later today or tomorrow.  [place] [phone]

## 2015-03-27 NOTE — Progress Notes (Signed)
[place] at [place] NAME: Lucas Newman    MR#:  [id number]  DATE OF BIRTH:  Sep 13, 1919  SUBJECTIVE:  CHIEF COMPLAINT:   Chief Complaint  Patient presents with  . Fall   Cough improved. Continues to have upper extremity soreness. Is looking forward to rehabilitation  REVIEW OF SYSTEMS:   Review of Systems  Constitutional: Negative for fever.  Respiratory: Negative for shortness of breath.   Cardiovascular: Positive for leg swelling. Negative for chest pain and palpitations.  Gastrointestinal: Negative for nausea, vomiting and abdominal pain.  Genitourinary: Negative for dysuria.  Neurological: Positive for weakness.    DRUG ALLERGIES:  No Known Allergies  VITALS:  Blood pressure 123/55, pulse 76, temperature 99.2 F (37.3 C), temperature source Oral, resp. rate 20, height 5\' 7"  (1.702 m), weight 84.369 kg (186 lb), SpO2 94 %.  PHYSICAL EXAMINATION:  GENERAL:  81 y.o.-year-old patient lying in the bed uncomfortable LUNGS: Normal breath sounds bilaterally, no wheezing, rales, rhonchi or crepitation. No use of accessory muscles of respiration.  CARDIOVASCULAR: S1, S2 normal. No murmurs, rubs, or gallops.  ABDOMEN: Soft, nontender, distended. Bowel sounds present. No organomegaly or mass.  EXTREMITIES: Trace bilateral pedal edema, cyanosis, or clubbing.  NEUROLOGIC: Cranial nerves II through XII are intact. Muscle strength 4+/5 in all extremities. Sensation intact. Gait not checked.  PSYCHIATRIC: The patient is alert and oriented x 3.  SKIN: No obvious rash, lesion, or ulcer.    LABORATORY PANEL:   CBC  Recent Labs Lab 03/27/15 0743  WBC 32.5*  HGB 7.6*  HCT 23.9*  PLT 172   ------------------------------------------------------------------------------------------------------------------  Chemistries   Recent Labs Lab 03/23/15 2310  03/27/15 0743  NA 142  < > 139  K 4.1  < > 3.8  CL 107  < > 105  CO2  23  < > 26  GLUCOSE 147*  < > 156*  BUN 28*  < > 53*  CREATININE 1.84*  < > 2.09*  CALCIUM 8.9  < > 8.5*  AST 16  --   --   ALT 10*  --   --   ALKPHOS 50  --   --   BILITOT 0.5  --   --   < > = values in this interval not displayed. ------------------------------------------------------------------------------------------------------------------  Cardiac Enzymes  Recent Labs Lab 03/24/15 1607  TROPONINI <0.03   ------------------------------------------------------------------------------------------------------------------  RADIOLOGY:  Dg Shoulder Right Port  03/26/2015  CLINICAL DATA:  Right shoulder pain, no recent injury EXAM: PORTABLE RIGHT SHOULDER - 2+ VIEW COMPARISON:  None. FINDINGS: Degenerative change in spurring in the [facility] joint. Glenohumeral joint normal. Normal alignment no fracture. IMPRESSION: AC degenerative change.  No acute skeletal abnormality. Electronically Signed   By: Franchot Gallo M.D.   On: 03/26/2015 13:12    EKG:   Orders placed or performed during the hospital encounter of 03/23/15  . EKG 12-Lead  . EKG 12-Lead    ASSESSMENT AND PLAN:   #1 left and right neck pain:  - Pain started after prolonged episode of trying to get out of a chair and then falling - Admission x-ray of lumbar spine, thoracic spine, CT of cervical spine, chest and head show no fractures - Repeat x-ray of right shoulder shows arthritis - Continue with Tylenol, Flexeril, morphine as needed. He has not used any IV morphine for 24 hours. Will add Vicodin for longer acting relief - Continue physical therapy  #2 chest pain: No chest pain at  this time. No EKG changes. Troponins negative 3. There was some concern for cardiac enlargement on chest CT. He did have an echo in March, and repeat echocardiogram during this hospitalization is unchanged. Ejection fraction 50-55%, possible grade 1 diastolic dysfunction  #3 CML: Appreciate oncology consultation today. Recent bone marrow  biopsy shows low-grade leukemia. He does have an increasing leukocytosis and worsening anemia. Transfuse 2 units packed red blood cells today  #4 history of CVA: Continue Aggrenox. No changes  #5 BPH: Continue finasteride and tamsulosin  #6 weakness: Profoundly weak after fall. States that he has bilateral upper extremity pain due to muscle spasm and fatigue. He is requiring significant assistance for ADLs. We'll required skilled nursing  #7 cough: Start Tussionex  #7 acute on chronic kidney disease: Creatinine increased today. Start gentle hydration  All the records are reviewed and case discussed with Care Management/Social Workerr. Management plans discussed with the patient, family and they are in agreement. Anticipate discharge to skilled nursing tomorrow  CODE STATUS: DO NOT RESUSCITATE  TOTAL TIME TAKING CARE OF THIS PATIENT: 35 minutes.  Greater than 50% of time spent in care coordination and counseling. POSSIBLE D/C IN 1 DAYS, DEPENDING ON CLINICAL CONDITION.   Myrtis Ser M.D on 03/27/2015 at 4:29 PM  Between 7am to 6pm - Pager - [phone]  After 6pm go to [email or web address] - password [email or web address] [place] [facility]  Office  [phone]  CC: Primary care physician; Lavera Guise, MD

## 2015-03-27 NOTE — Evaluation (Signed)
Occupational Therapy Evaluation Patient Details Name: Lucas Newman MRN: [id number] DOB: 25-Apr-1920 Today's Date: 03/27/2015    History of Present Illness Pt is a 81 yo male who was came to the hospital after a mechanical fall resulting in left shoulder and neck pain   Clinical Impression   Pt seen for OT evaluation and limited in participation due to receiving a blood transfusion.  He presents with moderate neck tightness and pain with minimal active movement sitting up in bed with a sitter in the room with him.  He currently requires moderate assist for LB dressing skills per patient report and rec use of AD to increase independence but pt states he has caregivers 5 hours a day 7 days a week to help.  He stated he would like to work on getting stronger and get to the toilet instead of using a commode seat.  Rec skilled OT services to increase independence in ADLs, especially toileting.  Pt would benefit from SNF to continue to increase strength, endurance and activity tolerance.  His wife is currently at [employer].      Follow Up Recommendations  SNF    Equipment Recommendations       Recommendations for Other Services       Precautions / Restrictions Precautions Precautions: Fall Restrictions Weight Bearing Restrictions: No      Mobility Bed Mobility                  Transfers                      Balance                                            ADL Overall ADL's : Needs assistance/impaired                                       General ADL Comments: Pt currently receiving a blood transfusion currently and limited in ADLs due to this and decreased AROM of neck.  He currently requires moderate assistance for LB bathing and dressing and his sitter indicated she helped provide assistance as needed for dressing but for the most part he was able to complete on his own.  Pt complaining of using BSC and  wants to be able to get up to the toilet for BM.        Vision     Perception     Praxis      Pertinent Vitals/Pain Pain Assessment: 0-10 Pain Score: 4  Pain Location: L shoulder and neck Pain Descriptors / Indicators: Aching;Constant;Nagging Pain Intervention(s): Limited activity within patient's tolerance;Monitored during session     Hand Dominance Left   Extremity/Trunk Assessment Upper Extremity Assessment Upper Extremity Assessment: Generalized weakness   Lower Extremity Assessment Lower Extremity Assessment: Defer to PT evaluation       Communication Communication Communication: No difficulties   Cognition Arousal/Alertness: Awake/alert Behavior During Therapy: WFL for tasks assessed/performed Overall Cognitive Status: Within Functional Limits for tasks assessed                     General Comments       Exercises       Shoulder Instructions      Home  Living Family/patient expects to be discharged to:: Private residence Living Arrangements: Spouse/significant other Available Help at Discharge: Available PRN/intermittently;Personal care attendant (caregivers 5 hours a day 7 days a week) Type of Home: House Home Access: Stairs to enter [employer] of Steps: 3 steps to enter (B rail front stairs; 1 rail in garage)   Home Layout: Able to live on main level with bedroom/bathroom     Bathroom Shower/Tub: Tub/shower unit Shower/tub characteristics: Architectural technologist: Standard Bathroom Accessibility: Yes How Accessible: Accessible via walker Home Equipment: [place] - 4 wheels;Walker - 2 wheels;Tub bench;Hand held shower head          Prior Functioning/Environment Level of Independence: Independent with assistive device(s);Needs assistance    ADL's / Homemaking Assistance Needed: Pt has caregivers 5 hours a day 7 days a week to help with bathing and dressing as needed and cooking;his wife is in rehab currently   Comments: Pt  was ambulating household distances with RW or rollator. Pt receives assist from caregivers during the day for ADLs.     OT Diagnosis: Generalized weakness;Acute pain   OT Problem List: Decreased strength;Pain;Decreased activity tolerance   OT Treatment/Interventions: Self-care/ADL training;DME and/or AE instruction;Patient/family education;Therapeutic activities    OT Goals(Current goals can be found in the care plan section) Acute Rehab OT Goals Patient Stated Goal: "to get to the toilet instead of the commode" OT Goal Formulation: With patient Time For Goal Achievement: 04/10/15 Potential to Achieve Goals: Good ADL Goals Pt Will Perform Lower Body Dressing: with min assist;sit to/from stand Pt Will Transfer to Toilet: with mod assist;stand pivot transfer;bedside commode (BSC over toilet)  OT Frequency: Min 1X/week   Barriers to D/C:            Co-evaluation              End of Session    Activity Tolerance: Patient limited by fatigue;Patient limited by pain Patient left: in bed;with bed alarm set;with nursing/sitter in room;with call bell/phone within reach   Time: 1455-1520 OT Time Calculation (min): 25 min Charges:  OT General Charges $OT Visit: 1 Procedure OT Evaluation $Initial OT Evaluation Tier I: 1 Procedure OT Treatments $Self Care/Home Management : 8-22 mins G-Codes:    Nicki Furlan 04/06/15, 3:37 PM    Chrys Racer, OTR/L ascom [phone]

## 2015-03-28 LAB — CBC
HCT: 29.4 % — ABNORMAL LOW (ref 40.0–52.0)
HEMATOCRIT: 29.3 % — AB (ref 40.0–52.0)
HEMOGLOBIN: 9.9 g/dL — AB (ref 13.0–18.0)
HEMOGLOBIN: 9.7 g/dL — AB (ref 13.0–18.0)
MCH: 30 pg (ref 26.0–34.0)
MCH: 29.3 pg (ref 26.0–34.0)
MCHC: 32.9 g/dL (ref 32.0–36.0)
MCHC: 33.7 g/dL (ref 32.0–36.0)
MCV: 89 fL (ref 80.0–100.0)
MCV: 89 fL (ref 80.0–100.0)
Platelets: 170 10*3/uL (ref 150–440)
Platelets: 171 10*3/uL (ref 150–440)
RBC: 3.29 MIL/uL — ABNORMAL LOW (ref 4.40–5.90)
RBC: 3.3 MIL/uL — AB (ref 4.40–5.90)
RDW: 14.9 % — ABNORMAL HIGH (ref 11.5–14.5)
RDW: 15.5 % — ABNORMAL HIGH (ref 11.5–14.5)
WBC: 35.5 10*3/uL — ABNORMAL HIGH (ref 3.8–10.6)
WBC: 39 10*3/uL — ABNORMAL HIGH (ref 3.8–10.6)

## 2015-03-28 LAB — TYPE AND SCREEN
ABO/RH(D): A POS
ANTIBODY SCREEN: NEGATIVE
UNIT DIVISION: 0
UNIT DIVISION: 0

## 2015-03-28 LAB — BASIC METABOLIC PANEL
ANION GAP: 7 (ref 5–15)
BUN: 50 mg/dL — ABNORMAL HIGH (ref 6–20)
CHLORIDE: 106 mmol/L (ref 101–111)
CO2: 26 mmol/L (ref 22–32)
CREATININE: 1.85 mg/dL — AB (ref 0.61–1.24)
Calcium: 8.6 mg/dL — ABNORMAL LOW (ref 8.9–10.3)
GFR calc non Af Amer: 30 mL/min — ABNORMAL LOW (ref >60)
GFR, EST AFRICAN AMERICAN: 35 mL/min — AB (ref >60)
Glucose, Bld: 134 mg/dL — ABNORMAL HIGH (ref 65–99)
Potassium: 3.8 mmol/L (ref 3.5–5.1)
Sodium: 139 mmol/L (ref 135–145)

## 2015-03-28 LAB — SEDIMENTATION RATE: Sed Rate: 75 mm/hr — ABNORMAL HIGH (ref 0–20)

## 2015-03-28 LAB — C-REACTIVE PROTEIN: CRP: 14.8 mg/dL — ABNORMAL HIGH (ref <1.0)

## 2015-03-28 MED ORDER — HYDROCODONE-ACETAMINOPHEN 5-325 MG PO TABS: 1.0000 | ORAL_TABLET | ORAL | Status: AC | PRN

## 2015-03-28 MED ORDER — CARVEDILOL 6.25 MG PO TABS: 6.2500 mg | ORAL_TABLET | Freq: Two times a day (BID) | ORAL | Status: AC

## 2015-03-28 MED ORDER — DOCUSATE SODIUM 100 MG PO CAPS: 100.0000 mg | ORAL_CAPSULE | Freq: Two times a day (BID) | ORAL | Status: AC

## 2015-03-28 MED ORDER — CYCLOBENZAPRINE HCL 5 MG PO TABS: 5.0000 mg | ORAL_TABLET | Freq: Three times a day (TID) | ORAL | Status: AC | PRN

## 2015-03-28 MED ORDER — HYDROCOD POLST-CPM POLST ER 10-8 MG/5ML PO SUER: 5.0000 mL | Freq: Two times a day (BID) | ORAL | Status: AC | PRN

## 2015-03-28 MED ORDER — GUAIFENESIN 100 MG/5ML PO SOLN: 10.0000 mL | ORAL | Status: AC | PRN

## 2015-03-28 NOTE — Progress Notes (Signed)
Discharge instructions given and went over with patient and daughter at bedside. All questions answered. Patient discharged to [facility] via car with daughter. Patient taken out to visitors entrance via wheelchair by nursing staff. Madlyn Frankel, RN

## 2015-03-28 NOTE — Progress Notes (Signed)
Report called to [facility]. Madlyn Frankel, RN

## 2015-03-28 NOTE — Clinical Social Work Note (Signed)
Pt is ready for discharge today and will discharge to [facility]. Facility has received discharge information and is ready to admit pt. Pt's daughter will provide transportation. RN call report. Pt and daughter are in agreement with discharge plan. CSW is signing off a no further needs identified.   Darden Dates, MSW, LCSW Clinical Social Worker  [phone]

## 2015-03-28 NOTE — Clinical Documentation Improvement (Signed)
Internal Medicine Acute on chronic kidney disease stage 3 currently documented.  Please clarify if this can be further specified.     Acute kidney injury / acute renal failure on chronic kidney disease stage 3 (please specify the type of AKI/ARF if known)  Other  Clinically Undetermined  Please exercise your independent, professional judgment when responding. A specific answer is not anticipated or expected.Please update your documentation within the medical record to reflect your response to this query.  Thank you, Mateo Flow, RN [phone] Clinical Documentation Specialist

## 2015-03-28 NOTE — Plan of Care (Signed)
Problem: Safety: Goal: Ability to remain free from injury will improve Outcome: Progressing Pt remains free from falls.  Pt uses BSC and urinal with one person assist.  He will call with the need to get up.  No impulsiveness. Phone and call bell in reach, bed in lowest position.  Problem: Pain Managment: Goal: General experience of comfort will improve Outcome: Progressing Pt had large BM early in the shift and is feeling much better since.

## 2015-03-28 NOTE — Discharge Summary (Signed)
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 Hyperlipidemia   . GERD (gastroesophageal reflux disease)   . Arthritis   . CVA (cerebral infarction)   . CML (chronic myelocytic leukemia) ([place])     HOSPITAL COURSE:    #1 left and right neck pain:  - Pain started after prolonged episode of trying to get out of a chair and then falling. - Admission x-ray of lumbar spine, thoracic spine, CT of cervical spine, chest and head show no fractures - Repeat x-ray of right shoulder shows arthritis - Continue with Tylenol, Flexeril, Vicodin as needed.  - Continue physical therapy, will be discharging to skilled nursing facility.  #2 chest pain: No chest pain since the time of admission.. No EKG changes. Troponins negative 3. There was some concern for cardiac enlargement on chest CT. He did have an echo in March, and repeat echocardiogram during this hospitalization is unchanged. Ejection fraction 50-55%, possible grade 1 diastolic dysfunction  #3 CML: Appreciate oncology consultation today. Recent bone marrow biopsy shows low-grade leukemia. He has a stable leukocytosis at about 30,000 and worsening anemia. He received 2 units packed red blood cells on 11/21.   #4 history of CVA: Continue Aggrenox. No changes  #5 BPH: Continue finasteride and tamsulosin  #6 weakness: Profoundly weak after fall. States that he has bilateral upper extremity pain due to muscle spasm and fatigue. He is requiring significant  assistance for ADLs. We'll required skilled nursing.  #7 cough: Likely due to viral upper respiratory tract infection. No evidence of pneumonia congestive heart failure COPD. Symptoms are improving. Tussionex and benzonatate as needed.   #7 acute on chronic kidney disease stage III: Baseline creatinine around 1.8-2.0. Today at baseline. Continue to monitor.  #8 history of temporal arteritis: Continue on low-dose prednisone.  DISCHARGE CONDITIONS:   fair  CONSULTS OBTAINED:  Treatment Team:  Cammie Sickle, MD  DRUG ALLERGIES:  No Known Allergies  DISCHARGE MEDICATIONS:   Current Discharge Medication List    START taking these medications   Details  carvedilol (COREG) 6.25 MG tablet Take 1 tablet (6.25 mg total) by mouth 2 (two) times daily with a meal. Qty: 60 tablet, Refills: 0    chlorpheniramine-HYDROcodone (TUSSIONEX) 10-8 MG/5ML SUER Take 5 mLs by mouth every 12 (twelve) hours as needed for cough. Qty: 140 mL, Refills: 0    cyclobenzaprine (FLEXERIL) 5 MG tablet Take 1 tablet (5 mg total) by mouth 3 (three) times daily as needed for muscle spasms. Qty: 30 tablet, Refills: 0    docusate sodium (COLACE) 100 MG capsule Take 1 capsule (100 mg total) by mouth 2 (two) times daily. Qty: 10 capsule, Refills: 0    guaiFENesin (ROBITUSSIN) 100 MG/5ML SOLN Take 10 mLs (200 mg total) by mouth every 4 (four) hours as needed for cough or to loosen phlegm. Qty: 1200 mL, Refills: 0    HYDROcodone-acetaminophen (NORCO/VICODIN) 5-325 MG tablet Take 1 tablet by mouth every 4 (four) hours as needed for moderate pain. Qty:  30 tablet, Refills: 0      CONTINUE these medications which have NOT CHANGED   Details  acetaminophen (TYLENOL) 650 MG CR tablet Take 650 mg by mouth every 4 (four) hours as needed for pain.     Calcium Carb-Cholecalciferol (CALCIUM 600+D) 600-800 MG-UNIT TABS Take 1 tablet by mouth daily.    dipyridamole-aspirin (AGGRENOX) 200-25 MG per 12 hr capsule Take 1  capsule by mouth 2 (two) times daily.     finasteride (PROSCAR) 5 MG tablet Take 5 mg by mouth daily.    fluticasone (FLONASE) 50 MCG/ACT nasal spray Place 1 spray into both nostrils 2 (two) times daily as needed for rhinitis.    loratadine (CLARITIN) 10 MG tablet Take 10 mg by mouth daily.    Magnesium 250 MG TABS Take 250 mg by mouth at bedtime.     Multiple Minerals-Vitamins (CALCIUM-MAGNESIUM-ZINC-D3 PO) Take 1 tablet by mouth daily.    Multiple Vitamin (MULTIVITAMIN WITH MINERALS) TABS tablet Take 1 tablet by mouth daily.    omeprazole (PRILOSEC) 20 MG capsule Take 20 mg by mouth 2 (two) times daily.     predniSONE (DELTASONE) 5 MG tablet Take 2.5 mg by mouth daily.     senna-docusate (SENOKOT-S) 8.6-50 MG tablet Take 1 tablet by mouth at bedtime.    simvastatin (ZOCOR) 20 MG tablet Take 20 mg by mouth at bedtime.     tamsulosin (FLOMAX) 0.4 MG CAPS capsule Take 0.4 mg by mouth at bedtime.          DISCHARGE INSTRUCTIONS:    DIET:  Cardiac diet  DISCHARGE CONDITION:  Fair  ACTIVITY:  Activity as tolerated per physical therapy recommendations  OXYGEN:  Home Oxygen: No.   Oxygen Delivery: room air  DISCHARGE LOCATION:  nursing home for physical rehabilitation  If you experience worsening of your admission symptoms, develop shortness of breath, life threatening emergency, suicidal or homicidal thoughts you must seek medical attention immediately by calling 911 or calling your MD immediately  if symptoms less severe.  You Must read complete instructions/literature along with all the possible adverse reactions/side effects for all the Medicines you take and that have been prescribed to you. Take any new Medicines after you have completely understood and accpet all the possible adverse reactions/side effects.   Please note  You were cared for by a hospitalist during your hospital stay. If you have any questions about your discharge medications or the care you  received while you were in the hospital after you are discharged, you can call the unit and asked to speak with the hospitalist on call if the hospitalist that took care of you is not available. Once you are discharged, your primary care physician will handle any further medical issues. Please note that NO REFILLS for any discharge medications will be authorized once you are discharged, as it is imperative that you return to your primary care physician (or establish a relationship with a primary care physician if you do not have one) for your aftercare needs so that they can reassess your need for medications and monitor your lab values.   Today   CHIEF COMPLAINT:   Chief Complaint  Patient presents with  . Fall    HISTORY OF PRESENT ILLNESS:  The patient presents emergency department after suffering a mechanical fall.the patient states that his floors are slick and that he got up to use the bathroom but slipped in his socks. He fell on his left side and complains of left shoulder and  neck pain. After arrival to the hospital the patient mentioned that he had some chest pain. He has had nausea over the last week and some generalized weakness but tonight his chest pain is not associated with either. He denies shortness of breath or diaphoresis. He mentions that his legs have been swelling more than usual. Also survey revealed no fractures or subluxations but due to the patient's generalized weakness and vague report of chest pain the emergency department staff called for admission.  VITAL SIGNS:  Blood pressure 121/57, pulse 73, temperature 98 F (36.7 C), temperature source Oral, resp. rate 20, height _0  (1.702 m), weight 83.28 kg (183 lb 9.6 oz), SpO2 95 %.  I/O:    Intake/Output Summary (Last 24 hours) at 03/28/15 1405 Last data filed at 03/28/15 1300  Gross per 24 hour  Intake   1117 ml  Output    150 ml  Net    967 ml    PHYSICAL EXAMINATION:  GENERAL:  81 y.o.-year-old patient  lying in the bed with no acute distress.  LUNGS: Normal breath sounds bilaterally, no wheezing, rales,rhonchi or crepitation. No use of accessory muscles of respiration.  CARDIOVASCULAR: S1, S2 normal. No murmurs, rubs, or gallops.  ABDOMEN: Soft, non-tender, non-distended. Bowel sounds present. No organomegaly or mass.  EXTREMITIES: No pedal edema, cyanosis, or clubbing.  NEUROLOGIC: Cranial nerves II through XII are intact. Muscle strength 5/5 in all extremities. Sensation intact. Gait not checked.  PSYCHIATRIC: The patient is alert and oriented x 3.  SKIN: No obvious rash, lesion, or ulcer.   DATA REVIEW:   CBC  Recent Labs Lab 03/28/15 0508  WBC 35.5*  HGB 9.9*  HCT 29.3*  PLT 171    Chemistries   Recent Labs Lab 03/23/15 2310  03/28/15 0508  NA 142  < > 139  K 4.1  < > 3.8  CL 107  < > 106  CO2 23  < > 26  GLUCOSE 147*  < > 134*  BUN 28*  < > 50*  CREATININE 1.84*  < > 1.85*  CALCIUM 8.9  < > 8.6*  AST 16  --   --   ALT 10*  --   --   ALKPHOS 50  --   --   BILITOT 0.5  --   --   < > = values in this interval not displayed.  Cardiac Enzymes  Recent Labs Lab 03/24/15 1607  TROPONINI <0.03    Microbiology Results  Results for orders placed or performed during the hospital encounter of 03/20/15  Culture, blood (routine x 2)     Status: None   Collection Time: 03/20/15  5:04 PM  Result Value Ref Range Status   Specimen Description BLOOD RIGHT HAND  Final   Special Requests BOTTLES DRAWN AEROBIC AND ANAEROBIC [place]  Final   Culture NO GROWTH 5 DAYS  Final   Report Status 03/25/2015 FINAL  Final  Culture, blood (routine x 2)     Status: None   Collection Time: 03/20/15  5:05 PM  Result Value Ref Range Status   Specimen Description BLOOD RIGHT ARM  Final   Special Requests BOTTLES DRAWN AEROBIC AND ANAEROBIC 5CC  Final   Culture NO GROWTH 5 DAYS  Final   Report Status 03/25/2015 FINAL  Final    RADIOLOGY:  No results found.  EKG:   Orders placed or  performed during the hospital encounter of 03/23/15  . EKG 12-Lead  . EKG 12-Lead  Management plans discussed with the patient, family and they are in agreement.  CODE STATUS:     Code Status Orders        Start     Ordered   03/24/15 0410  Do not attempt resuscitation (DNR)   Continuous    Question Answer Comment  In the event of cardiac or respiratory ARREST Do not call a "code blue"   In the event of cardiac or respiratory ARREST Do not perform Intubation, CPR, defibrillation or ACLS   In the event of cardiac or respiratory ARREST Use medication by any route, position, wound care, and other measures to relive pain and suffering. May use oxygen, suction and manual treatment of airway obstruction as needed for comfort.      03/24/15 0409    Advance Directive Documentation        Most Recent Value   Type of Advance Directive  Living will   Pre-existing out of facility DNR order (yellow form or pink MOST form)     "MOST" Form in Place?        TOTAL TIME TAKING CARE OF THIS PATIENT: 35 minutes.  Greater than 50% of time spent in care coordination and counseling.  Myrtis Ser M.D on 03/28/2015 at 2:05 PM  Between 7am to 6pm - Pager - [phone]  After 6pm go to [email or web address] - password [email or web address] [place] [facility]  Office  [phone]  CC: Primary care physician; Lavera Guise, MD

## 2015-03-28 NOTE — Progress Notes (Signed)
Occupational Therapy Treatment Patient Details Name: DYER HUIZAR MRN: [id number] DOB: 10/25/1919 Today's Date: 03/28/2015    History of present illness Pt is a 81 yo male who was came to the hospital after a mechanical fall resulting in left shoulder and neck pain   OT comments  Reviewed energy saving techniques with daughter (patient very sleepy) and some safety instructions.  Patient daughter reports that her mom is in rehab now and father will be going to [facility] as well.   Follow Up Recommendations  SNF    Equipment Recommendations       Recommendations for Other Services      Precautions / Restrictions Precautions Precautions: Fall Restrictions Weight Bearing Restrictions: No       Mobility Bed Mobility                  Transfers                      Balance                                   ADL                                                Vision                     Perception     Praxis      Cognition                             Extremity/Trunk Assessment               Exercises     Shoulder Instructions       General Comments      Pertinent Vitals/ Pain          Home Living                                          Prior Functioning/Environment              Frequency       Progress Toward Goals  OT Goals(current goals can now be found in the care plan section)        Plan      Co-evaluation                 End of Session     Activity Tolerance     Patient Left in bed;with bed alarm set;with family/visitor present   Nurse Communication          Time: 1419-1430 OT Time Calculation (min): 11 min  Charges: OT General Charges $OT Visit: 1 Procedure OT Treatments $Self Care/Home Management : 8-22 mins Sharon Mt, MS/OTR/L  Sharon Mt 03/28/2015, 2:34 PM

## 2015-03-28 NOTE — Discharge Instructions (Signed)
See discharge summary

## 2015-03-29 LAB — COMP PANEL: LEUKEMIA/LYMPHOMA

## 2015-04-26 ENCOUNTER — Inpatient Hospital Stay: Payer: Medicare Other

## 2015-04-26 ENCOUNTER — Inpatient Hospital Stay: Payer: Medicare Other | Attending: Internal Medicine | Admitting: Internal Medicine

## 2015-04-26 ENCOUNTER — Telehealth: Payer: Self-pay | Admitting: Internal Medicine

## 2015-04-26 VITALS — BP 121/69 | HR 90 | Temp 97.2°F | Ht 67.0 in | Wt 183.0 lb

## 2015-04-26 DIAGNOSIS — D649 Anemia, unspecified: Secondary | ICD-10-CM | POA: Diagnosis not present

## 2015-04-26 DIAGNOSIS — Z8673 Personal history of transient ischemic attack (TIA), and cerebral infarction without residual deficits: Secondary | ICD-10-CM | POA: Insufficient documentation

## 2015-04-26 DIAGNOSIS — C9310 Chronic myelomonocytic leukemia not having achieved remission: Secondary | ICD-10-CM | POA: Diagnosis present

## 2015-04-26 DIAGNOSIS — Z79899 Other long term (current) drug therapy: Secondary | ICD-10-CM | POA: Insufficient documentation

## 2015-04-26 DIAGNOSIS — K219 Gastro-esophageal reflux disease without esophagitis: Secondary | ICD-10-CM | POA: Insufficient documentation

## 2015-04-26 DIAGNOSIS — Z87891 Personal history of nicotine dependence: Secondary | ICD-10-CM | POA: Insufficient documentation

## 2015-04-26 DIAGNOSIS — R0602 Shortness of breath: Secondary | ICD-10-CM

## 2015-04-26 DIAGNOSIS — Z7982 Long term (current) use of aspirin: Secondary | ICD-10-CM | POA: Diagnosis not present

## 2015-04-26 DIAGNOSIS — E785 Hyperlipidemia, unspecified: Secondary | ICD-10-CM | POA: Diagnosis not present

## 2015-04-26 LAB — CBC WITH DIFFERENTIAL/PLATELET
Basophils Absolute: 0 10*3/uL (ref 0–0.1)
Basophils Relative: 0 %
EOS ABS: 0.2 10*3/uL (ref 0–0.7)
Eosinophils Relative: 1 %
HCT: 31.7 % — ABNORMAL LOW (ref 40.0–52.0)
Hemoglobin: 10.2 g/dL — ABNORMAL LOW (ref 13.0–18.0)
LYMPHS ABS: 2 10*3/uL (ref 1.0–3.6)
Lymphocytes Relative: 9 %
MCH: 29.4 pg (ref 26.0–34.0)
MCHC: 32.3 g/dL (ref 32.0–36.0)
MCV: 91 fL (ref 80.0–100.0)
MONO ABS: 6 10*3/uL — AB (ref 0.2–1.0)
Monocytes Relative: 27 %
Myelocytes: 3 %
NEUTROS ABS: 13.9 10*3/uL — AB (ref 1.4–6.5)
Neutrophils Relative %: 60 %
PLATELETS: 179 10*3/uL (ref 150–440)
RBC: 3.48 MIL/uL — AB (ref 4.40–5.90)
RDW: 15.1 % — AB (ref 11.5–14.5)
WBC: 22.1 10*3/uL — AB (ref 3.8–10.6)

## 2015-04-26 LAB — COMPREHENSIVE METABOLIC PANEL
ALT: 10 U/L — ABNORMAL LOW (ref 17–63)
AST: 13 U/L — AB (ref 15–41)
Albumin: 2.9 g/dL — ABNORMAL LOW (ref 3.5–5.0)
Alkaline Phosphatase: 64 U/L (ref 38–126)
Anion gap: 10 (ref 5–15)
BILIRUBIN TOTAL: 0.4 mg/dL (ref 0.3–1.2)
BUN: 35 mg/dL — AB (ref 6–20)
CALCIUM: 8.5 mg/dL — AB (ref 8.9–10.3)
CO2: 24 mmol/L (ref 22–32)
CREATININE: 1.76 mg/dL — AB (ref 0.61–1.24)
Chloride: 102 mmol/L (ref 101–111)
GFR calc Af Amer: 37 mL/min — ABNORMAL LOW (ref >60)
GFR, EST NON AFRICAN AMERICAN: 32 mL/min — AB (ref >60)
Glucose, Bld: 143 mg/dL — ABNORMAL HIGH (ref 65–99)
POTASSIUM: 3.8 mmol/L (ref 3.5–5.1)
Sodium: 136 mmol/L (ref 135–145)
TOTAL PROTEIN: 6.9 g/dL (ref 6.5–8.1)

## 2015-04-26 LAB — SAMPLE TO BLOOD BANK

## 2015-04-26 NOTE — Patient Instructions (Signed)
Darbepoetin Alfa injection What is this medicine? DARBEPOETIN ALFA (dar be POE e tin AL fa) helps your body make more red blood cells. It is used to treat anemia caused by chronic kidney failure and chemotherapy. This medicine may be used for other purposes; ask your health care provider or pharmacist if you have questions. What should I tell my health care provider before I take this medicine? They need to know if you have any of these conditions: -blood clotting disorders or history of blood clots -cancer patient not on chemotherapy -cystic fibrosis -heart disease, such as angina, heart failure, or a history of a heart attack -hemoglobin level of 12 g/dL or greater -high blood pressure -low levels of folate, iron, or vitamin B12 -seizures -an unusual or allergic reaction to darbepoetin, erythropoietin, albumin, hamster proteins, latex, other medicines, foods, dyes, or preservatives -pregnant or trying to get pregnant -breast-feeding How should I use this medicine? This medicine is for injection into a vein or under the skin. It is usually given by a health care professional in a hospital or clinic setting. If you get this medicine at home, you will be taught how to prepare and give this medicine. Do not shake the solution before you withdraw a dose. Use exactly as directed. Take your medicine at regular intervals. Do not take your medicine more often than directed. It is important that you put your used needles and syringes in a special sharps container. Do not put them in a trash can. If you do not have a sharps container, call your pharmacist or healthcare provider to get one. Talk to your pediatrician regarding the use of this medicine in children. While this medicine may be used in children as young as 1 year for selected conditions, precautions do apply. Overdosage: If you think you have taken too much of this medicine contact a poison control center or emergency room at once. NOTE:  This medicine is only for you. Do not share this medicine with others. What if I miss a dose? If you miss a dose, take it as soon as you can. If it is almost time for your next dose, take only that dose. Do not take double or extra doses. What may interact with this medicine? Do not take this medicine with any of the following medications: -epoetin alfa This list may not describe all possible interactions. Give your health care provider a list of all the medicines, herbs, non-prescription drugs, or dietary supplements you use. Also tell them if you smoke, drink alcohol, or use illegal drugs. Some items may interact with your medicine. What should I watch for while using this medicine? Visit your prescriber or health care professional for regular checks on your progress and for the needed blood tests and blood pressure measurements. It is especially important for the doctor to make sure your hemoglobin level is in the desired range, to limit the risk of potential side effects and to give you the best benefit. Keep all appointments for any recommended tests. Check your blood pressure as directed. Ask your doctor what your blood pressure should be and when you should contact him or her. As your body makes more red blood cells, you may need to take iron, folic acid, or vitamin B supplements. Ask your doctor or health care provider which products are right for you. If you have kidney disease continue dietary restrictions, even though this medication can make you feel better. Talk with your doctor or health care professional about the   foods you eat and the vitamins that you take. What side effects may I notice from receiving this medicine? Side effects that you should report to your doctor or health care professional as soon as possible: -allergic reactions like skin rash, itching or hives, swelling of the face, lips, or tongue -breathing problems -changes in vision -chest pain -confusion, trouble speaking  or understanding -feeling faint or lightheaded, falls -high blood pressure -muscle aches or pains -pain, swelling, warmth in the leg -rapid weight gain -severe headaches -sudden numbness or weakness of the face, arm or leg -trouble walking, dizziness, loss of balance or coordination -seizures (convulsions) -swelling of the ankles, feet, hands -unusually weak or tired Side effects that usually do not require medical attention (report to your doctor or health care professional if they continue or are bothersome): -diarrhea -fever, chills (flu-like symptoms) -headaches -nausea, vomiting -redness, stinging, or swelling at site where injected This list may not describe all possible side effects. Call your doctor for medical advice about side effects. You may report side effects to FDA at 1-800-FDA-1088. Where should I keep my medicine? Keep out of the reach of children. Store in a refrigerator between 2 and 8 degrees C (36 and 46 degrees F). Do not freeze. Do not shake. Throw away any unused portion if using a single-dose vial. Throw away any unused medicine after the expiration date. NOTE: This sheet is a summary. It may not cover all possible information. If you have questions about this medicine, talk to your doctor, pharmacist, or health care provider.    2016, Elsevier/Gold Standard. (2008-04-05 10:23:57)  

## 2015-04-26 NOTE — Progress Notes (Signed)
[name] daughter accompanied patient today. The patient is currently living with son and daughter in law Alysa. The patient's new phone number is [phone] (Alysa's cell)  And (Alyssa's home is # [phone].  Pt son's # is Waylyn Shingler) cell is [phone].  The daughter and son will be notified today if the patient requires a blood transfusion.  The patient and his daughter in law were provided educational material on aranesp. Questions were answered to their satisfaction.  Approximately 5 minutes spent on patient education.

## 2015-04-26 NOTE — Telephone Encounter (Signed)
Please inform patient's daughter-  That his hemoglobin is 10.2;  White count slightly lower at 22/ platelets normal.  Hold off any blood transfusion at this time;  Plan labs as recommended.

## 2015-04-26 NOTE — Progress Notes (Signed)
[facility] OFFICE PROGRESS NOTE  Patient Care Team: Lavera Guise, MD as PCP - General (Internal Medicine)   SUMMARY OF ONCOLOGIC HISTORY:  # MAY 2016- CMML type-1/MDS [wbc/monocytosis- 30~; Hb8-9; platelets- N] s/p BMBx [dr.Pandit]  # CKD creatinine ~1.8-2.0/ CHF    INTERVAL HISTORY:  A very pleasant 81 year old male patient with above history of CMML currently on surveillance is here for follow-up. I evaluated the patient with a first time approximately 6 weeks ago when the patient was in the hospital for CHF/worsening anemia. Patient's hemoglobin was down to 7.6; for which he was transfused 2 units of blood.  Patient is currently home; discharged from rehabilitation. He complains of shortness of breath especially with exertion. Denies any blood in stools black stools. No headaches or vision changes. He feels weak all the time.  REVIEW OF SYSTEMS:  A complete 10 point review of system is done which is negative except mentioned above/history of present illness.   PAST MEDICAL HISTORY :  Past Medical History  Diagnosis Date  . Hyperlipidemia   . GERD (gastroesophageal reflux disease)   . Arthritis   . CVA (cerebral infarction)   . CML (chronic myelocytic leukemia) ([facility])     PAST SURGICAL HISTORY :   Past Surgical History  Procedure Laterality Date  . Appendectomy    . Replacement total knee bilateral  2002/2004  . Carotid endarterectomy Left     FAMILY HISTORY :   Family History  Problem Relation Age of Onset  . Lung cancer Brother   . Diabetes type II Mother   . Hypertension Father     SOCIAL HISTORY:   Social History  Substance Use Topics  . Smoking status: Former Smoker -- 0.00 packs/day for 20 years    Quit date: 05/11/1984  . Smokeless tobacco: Never Used  . Alcohol Use: No     Comment: occasional beer/bloody mary    ALLERGIES:  has No Known Allergies.  MEDICATIONS:  Current Outpatient Prescriptions  Medication Sig Dispense Refill  .  acetaminophen (TYLENOL) 650 MG CR tablet Take 650 mg by mouth every 4 (four) hours as needed for pain.     . Calcium Carb-Cholecalciferol (CALCIUM 600+D) 600-800 MG-UNIT TABS Take 1 tablet by mouth daily.    . carvedilol (COREG) 6.25 MG tablet Take 1 tablet (6.25 mg total) by mouth 2 (two) times daily with a meal. 60 tablet 0  . chlorpheniramine-HYDROcodone (TUSSIONEX) 10-8 MG/5ML SUER Take 5 mLs by mouth every 12 (twelve) hours as needed for cough. 140 mL 0  . cyclobenzaprine (FLEXERIL) 5 MG tablet Take 1 tablet (5 mg total) by mouth 3 (three) times daily as needed for muscle spasms. 30 tablet 0  . dipyridamole-aspirin (AGGRENOX) 200-25 MG per 12 hr capsule Take 1 capsule by mouth 2 (two) times daily.     Marland Kitchen docusate sodium (COLACE) 100 MG capsule Take 1 capsule (100 mg total) by mouth 2 (two) times daily. 10 capsule 0  . finasteride (PROSCAR) 5 MG tablet Take 5 mg by mouth daily.    . fluticasone (FLONASE) 50 MCG/ACT nasal spray Place 1 spray into both nostrils 2 (two) times daily as needed for rhinitis.    Marland Kitchen guaiFENesin (ROBITUSSIN) 100 MG/5ML SOLN Take 10 mLs (200 mg total) by mouth every 4 (four) hours as needed for cough or to loosen phlegm. 1200 mL 0  . HYDROcodone-acetaminophen (NORCO/VICODIN) 5-325 MG tablet Take 1 tablet by mouth every 4 (four) hours as needed for moderate pain. Gravity  tablet 0  . loratadine (CLARITIN) 10 MG tablet Take 10 mg by mouth daily.    . Magnesium 250 MG TABS Take 250 mg by mouth at bedtime.     . Multiple Minerals-Vitamins (CALCIUM-MAGNESIUM-ZINC-D3 PO) Take 1 tablet by mouth daily.    . Multiple Vitamin (MULTIVITAMIN WITH MINERALS) TABS tablet Take 1 tablet by mouth daily.    Marland Kitchen omeprazole (PRILOSEC) 20 MG capsule Take 20 mg by mouth 2 (two) times daily.     . predniSONE (DELTASONE) 5 MG tablet Take 2.5 mg by mouth daily.     Marland Kitchen senna-docusate (SENOKOT-S) 8.6-50 MG tablet Take 1 tablet by mouth at bedtime.    . simvastatin (ZOCOR) 20 MG tablet Take 20 mg by mouth at  bedtime.     . tamsulosin (FLOMAX) 0.4 MG CAPS capsule Take 0.4 mg by mouth at bedtime.      No current facility-administered medications for this visit.    PHYSICAL EXAMINATION: ECOG PERFORMANCE STATUS: 2 - Symptomatic, <50% confined to bed  BP 121/69 mmHg  Pulse 90  Temp(Src) 97.2 F (36.2 C) (Tympanic)  Ht 5\' 7"  (1.702 m)  Wt 183 lb (83.008 kg)  BMI 28.66 kg/m2  Filed Weights   04/26/15 1315  Weight: 183 lb (83.008 kg)    GENERAL: Elderly Caucasian male patient Alert, no distress and comfortable.  Accompanied by his daughter/in a wheelchair. EYES: no pallor or icterus OROPHARYNX: no thrush or ulceration; good dentition  NECK: supple, no masses felt LYMPH:  no palpable lymphadenopathy in the cervical, axillary or inguinal regions LUNGS: clear to auscultation and  No wheeze or crackles HEART/CVS: regular rate & rhythm and no murmurs; No lower extremity edema ABDOMEN:abdomen soft, non-tender and normal bowel sounds Musculoskeletal:no cyanosis of digits and no clubbing  PSYCH: alert & oriented x 3 with fluent speech NEURO: no focal motor/sensory deficits SKIN:  Multiple ecchymosis noted  LABORATORY DATA:  I have reviewed the data as listed    Component Value Date/Time   NA 139 03/28/2015 0508   NA 141 11/12/2013 0839   K 3.8 03/28/2015 0508   K 4.2 11/12/2013 0839   CL 106 03/28/2015 0508   CL 107 11/12/2013 0839   CO2 26 03/28/2015 0508   CO2 28 11/12/2013 0839   GLUCOSE 134* 03/28/2015 0508   GLUCOSE 98 11/12/2013 0839   BUN 50* 03/28/2015 0508   BUN 45* 11/12/2013 0839   CREATININE 1.85* 03/28/2015 0508   CREATININE 2.09* 11/12/2013 0839   CALCIUM 8.6* 03/28/2015 0508   CALCIUM 8.7 11/12/2013 0839   PROT 7.2 03/23/2015 2310   ALBUMIN 3.2* 03/23/2015 2310   AST 16 03/23/2015 2310   ALT 10* 03/23/2015 2310   ALKPHOS 50 03/23/2015 2310   BILITOT 0.5 03/23/2015 2310   GFRNONAA 30* 03/28/2015 0508   GFRNONAA 27* 11/12/2013 0839   GFRAA 35* 03/28/2015 0508    GFRAA 31* 11/12/2013 0839    No results found for: SPEP, UPEP  Lab Results  Component Value Date   WBC 35.5* 03/28/2015   NEUTROABS 14.8* 03/23/2015   HGB 9.9* 03/28/2015   HCT 29.3* 03/28/2015   MCV 89.0 03/28/2015   PLT 171 03/28/2015      Chemistry      Component Value Date/Time   NA 139 03/28/2015 0508   NA 141 11/12/2013 0839   K 3.8 03/28/2015 0508   K 4.2 11/12/2013 0839   CL 106 03/28/2015 0508   CL 107 11/12/2013 0839   CO2 26 03/28/2015 0508  CO2 28 11/12/2013 0839   BUN 50* 03/28/2015 0508   BUN 45* 11/12/2013 0839   CREATININE 1.85* 03/28/2015 0508   CREATININE 2.09* 11/12/2013 0839      Component Value Date/Time   CALCIUM 8.6* 03/28/2015 0508   CALCIUM 8.7 11/12/2013 0839   ALKPHOS 50 03/23/2015 2310   AST 16 03/23/2015 2310   ALT 10* 03/23/2015 2310   BILITOT 0.5 03/23/2015 2310       RADIOGRAPHIC STUDIES: I have personally reviewed the radiological images as listed and agreed with the findings in the report. No results found.   ASSESSMENT & PLAN:   # CMML/MDS. White count 30,000/hemoglobin 8-9/platelets normal. Patient had needed blood transfusion for hemoglobin 7.6 approximately 6 weeks ago.  # I discussed multiple options and the pros and cons at length # hypomethylating agents- however at his age/general debility- patient is reluctant/I agree. # Blood transfusion as needed # Aranesp injections every 1-2 weeks/based on response.  Discussed that the mechanism of action; and the potential side effects of hypertension; and if hemoglobin above 12; the potential for strokes; also the rare risk of progression of any underlying malignancy. Patient/daughter have not made any decisions at regarding Aranesp.  # Anemia- multifactorial/CMML plus CKD. Plan as above  # For now we will check CBC/CMP; if hemoglobin less than 8 recommend 2 units of PRBC.  # Follow up CBC on a monthly basis; follow-up with me in 3 months CBC CMP. Patient was given a copy of  the blood counts/also copy of the bone marrow report.  # 40 minutes face-to-face with the patient discussing the above plan of care; more than 50% of time spent on prognosis/ natural history; counseling and coordination.      Cammie Sickle, MD 04/26/2015 1:30 PM

## 2015-04-27 NOTE — Telephone Encounter (Signed)
Spoke with patient's daughter. She is aware of the blood count results. At this time, we will continue monitoring the labs as planned. Daughter in Nutritional therapist appreciated call.

## 2015-05-18 ENCOUNTER — Encounter: Payer: Self-pay | Admitting: Emergency Medicine

## 2015-05-18 ENCOUNTER — Inpatient Hospital Stay
Admission: EM | Admit: 2015-05-18 | Discharge: 2015-06-07 | DRG: 871 | Disposition: E | Payer: Medicare Other | Attending: Internal Medicine | Admitting: Internal Medicine

## 2015-05-18 ENCOUNTER — Emergency Department: Payer: Medicare Other

## 2015-05-18 DIAGNOSIS — B029 Zoster without complications: Secondary | ICD-10-CM | POA: Diagnosis present

## 2015-05-18 DIAGNOSIS — J189 Pneumonia, unspecified organism: Secondary | ICD-10-CM | POA: Diagnosis present

## 2015-05-18 DIAGNOSIS — N179 Acute kidney failure, unspecified: Secondary | ICD-10-CM | POA: Diagnosis present

## 2015-05-18 DIAGNOSIS — Z66 Do not resuscitate: Secondary | ICD-10-CM | POA: Diagnosis not present

## 2015-05-18 DIAGNOSIS — Z9049 Acquired absence of other specified parts of digestive tract: Secondary | ICD-10-CM

## 2015-05-18 DIAGNOSIS — I4891 Unspecified atrial fibrillation: Secondary | ICD-10-CM | POA: Diagnosis present

## 2015-05-18 DIAGNOSIS — C9312 Chronic myelomonocytic leukemia, in relapse: Secondary | ICD-10-CM | POA: Insufficient documentation

## 2015-05-18 DIAGNOSIS — E876 Hypokalemia: Secondary | ICD-10-CM | POA: Diagnosis present

## 2015-05-18 DIAGNOSIS — Z8249 Family history of ischemic heart disease and other diseases of the circulatory system: Secondary | ICD-10-CM

## 2015-05-18 DIAGNOSIS — G4700 Insomnia, unspecified: Secondary | ICD-10-CM | POA: Diagnosis present

## 2015-05-18 DIAGNOSIS — J9601 Acute respiratory failure with hypoxia: Secondary | ICD-10-CM | POA: Diagnosis present

## 2015-05-18 DIAGNOSIS — K5900 Constipation, unspecified: Secondary | ICD-10-CM | POA: Diagnosis present

## 2015-05-18 DIAGNOSIS — Z6829 Body mass index (BMI) 29.0-29.9, adult: Secondary | ICD-10-CM | POA: Diagnosis not present

## 2015-05-18 DIAGNOSIS — N183 Chronic kidney disease, stage 3 (moderate): Secondary | ICD-10-CM | POA: Diagnosis present

## 2015-05-18 DIAGNOSIS — G9341 Metabolic encephalopathy: Secondary | ICD-10-CM | POA: Diagnosis present

## 2015-05-18 DIAGNOSIS — Z87891 Personal history of nicotine dependence: Secondary | ICD-10-CM

## 2015-05-18 DIAGNOSIS — K219 Gastro-esophageal reflux disease without esophagitis: Secondary | ICD-10-CM | POA: Diagnosis present

## 2015-05-18 DIAGNOSIS — M1990 Unspecified osteoarthritis, unspecified site: Secondary | ICD-10-CM | POA: Diagnosis present

## 2015-05-18 DIAGNOSIS — E785 Hyperlipidemia, unspecified: Secondary | ICD-10-CM | POA: Diagnosis present

## 2015-05-18 DIAGNOSIS — N400 Enlarged prostate without lower urinary tract symptoms: Secondary | ICD-10-CM | POA: Diagnosis present

## 2015-05-18 DIAGNOSIS — D72829 Elevated white blood cell count, unspecified: Secondary | ICD-10-CM

## 2015-05-18 DIAGNOSIS — E46 Unspecified protein-calorie malnutrition: Secondary | ICD-10-CM | POA: Diagnosis present

## 2015-05-18 DIAGNOSIS — D638 Anemia in other chronic diseases classified elsewhere: Secondary | ICD-10-CM | POA: Diagnosis present

## 2015-05-18 DIAGNOSIS — A419 Sepsis, unspecified organism: Principal | ICD-10-CM | POA: Diagnosis present

## 2015-05-18 DIAGNOSIS — J90 Pleural effusion, not elsewhere classified: Secondary | ICD-10-CM | POA: Diagnosis not present

## 2015-05-18 DIAGNOSIS — Z8673 Personal history of transient ischemic attack (TIA), and cerebral infarction without residual deficits: Secondary | ICD-10-CM

## 2015-05-18 DIAGNOSIS — J869 Pyothorax without fistula: Secondary | ICD-10-CM | POA: Diagnosis present

## 2015-05-18 DIAGNOSIS — N189 Chronic kidney disease, unspecified: Secondary | ICD-10-CM

## 2015-05-18 DIAGNOSIS — Z801 Family history of malignant neoplasm of trachea, bronchus and lung: Secondary | ICD-10-CM

## 2015-05-18 DIAGNOSIS — D469 Myelodysplastic syndrome, unspecified: Secondary | ICD-10-CM | POA: Diagnosis not present

## 2015-05-18 DIAGNOSIS — I509 Heart failure, unspecified: Secondary | ICD-10-CM | POA: Diagnosis present

## 2015-05-18 DIAGNOSIS — Z515 Encounter for palliative care: Secondary | ICD-10-CM | POA: Diagnosis present

## 2015-05-18 DIAGNOSIS — Z79899 Other long term (current) drug therapy: Secondary | ICD-10-CM

## 2015-05-18 DIAGNOSIS — Z96653 Presence of artificial knee joint, bilateral: Secondary | ICD-10-CM | POA: Diagnosis present

## 2015-05-18 DIAGNOSIS — Z833 Family history of diabetes mellitus: Secondary | ICD-10-CM

## 2015-05-18 DIAGNOSIS — J181 Lobar pneumonia, unspecified organism: Secondary | ICD-10-CM

## 2015-05-18 DIAGNOSIS — Y95 Nosocomial condition: Secondary | ICD-10-CM | POA: Diagnosis present

## 2015-05-18 DIAGNOSIS — R0602 Shortness of breath: Secondary | ICD-10-CM

## 2015-05-18 DIAGNOSIS — Z7951 Long term (current) use of inhaled steroids: Secondary | ICD-10-CM | POA: Diagnosis not present

## 2015-05-18 DIAGNOSIS — Z09 Encounter for follow-up examination after completed treatment for conditions other than malignant neoplasm: Secondary | ICD-10-CM

## 2015-05-18 LAB — COMPREHENSIVE METABOLIC PANEL
ALBUMIN: 2.4 g/dL — AB (ref 3.5–5.0)
ALT: 12 U/L — ABNORMAL LOW (ref 17–63)
ANION GAP: 8 (ref 5–15)
AST: 22 U/L (ref 15–41)
Alkaline Phosphatase: 58 U/L (ref 38–126)
BUN: 31 mg/dL — AB (ref 6–20)
CHLORIDE: 106 mmol/L (ref 101–111)
CO2: 24 mmol/L (ref 22–32)
Calcium: 7.8 mg/dL — ABNORMAL LOW (ref 8.9–10.3)
Creatinine, Ser: 1.77 mg/dL — ABNORMAL HIGH (ref 0.61–1.24)
GFR calc Af Amer: 36 mL/min — ABNORMAL LOW (ref >60)
GFR, EST NON AFRICAN AMERICAN: 31 mL/min — AB (ref >60)
Glucose, Bld: 137 mg/dL — ABNORMAL HIGH (ref 65–99)
POTASSIUM: 4.4 mmol/L (ref 3.5–5.1)
Sodium: 138 mmol/L (ref 135–145)
Total Bilirubin: 1.1 mg/dL (ref 0.3–1.2)
Total Protein: 6 g/dL — ABNORMAL LOW (ref 6.5–8.1)

## 2015-05-18 LAB — PROTIME-INR
INR: 1.17
Prothrombin Time: 15.1 seconds — ABNORMAL HIGH (ref 11.4–15.0)

## 2015-05-18 LAB — URINALYSIS COMPLETE WITH MICROSCOPIC (ARMC ONLY)
Bacteria, UA: NONE SEEN
Bilirubin Urine: NEGATIVE
Glucose, UA: NEGATIVE mg/dL
HGB URINE DIPSTICK: NEGATIVE
KETONES UR: NEGATIVE mg/dL
LEUKOCYTES UA: NEGATIVE
NITRITE: NEGATIVE
PH: 5 (ref 5.0–8.0)
PROTEIN: 30 mg/dL — AB
SQUAMOUS EPITHELIAL / LPF: NONE SEEN
Specific Gravity, Urine: 1.018 (ref 1.005–1.030)

## 2015-05-18 LAB — CBC WITH DIFFERENTIAL/PLATELET
Basophils Absolute: 0.1 10*3/uL (ref 0–0.1)
Basophils Relative: 0 %
Eosinophils Absolute: 0.1 10*3/uL (ref 0–0.7)
HEMATOCRIT: 32.1 % — AB (ref 40.0–52.0)
HEMOGLOBIN: 10.3 g/dL — AB (ref 13.0–18.0)
LYMPHS ABS: 1.2 10*3/uL (ref 1.0–3.6)
MCH: 29.7 pg (ref 26.0–34.0)
MCHC: 32 g/dL (ref 32.0–36.0)
MCV: 92.9 fL (ref 80.0–100.0)
Monocytes Absolute: 18 10*3/uL — ABNORMAL HIGH (ref 0.2–1.0)
Monocytes Relative: 42 %
NEUTROS ABS: 23.9 10*3/uL — AB (ref 1.4–6.5)
Neutrophils Relative %: 55 %
PLATELETS: 185 10*3/uL (ref 150–440)
RBC: 3.45 MIL/uL — AB (ref 4.40–5.90)
RDW: 16.1 % — ABNORMAL HIGH (ref 11.5–14.5)
WBC: 43.2 10*3/uL — AB (ref 3.8–10.6)

## 2015-05-18 LAB — LACTIC ACID, PLASMA
Lactic Acid, Venous: 1.8 mmol/L (ref 0.5–2.0)
Lactic Acid, Venous: 0.9 mmol/L (ref 0.5–2.0)

## 2015-05-18 LAB — TROPONIN I: Troponin I: <0.03 ng/mL (ref <0.031)

## 2015-05-18 LAB — APTT: aPTT: 29 seconds (ref 24–36)

## 2015-05-18 LAB — LIPASE, BLOOD: LIPASE: 18 U/L (ref 11–51)

## 2015-05-18 MED ORDER — HYDROCOD POLST-CPM POLST ER 10-8 MG/5ML PO SUER: 5.0000 mL | Freq: Two times a day (BID) | ORAL | Status: DC | PRN

## 2015-05-18 MED ORDER — PIPERACILLIN-TAZOBACTAM 3.375 G IVPB 30 MIN
3.3750 g | Freq: Once | INTRAVENOUS | Status: AC
Start: 2015-05-18 — End: 2015-05-18
  Administered 2015-05-18: 3.375 g via INTRAVENOUS
  Filled 2015-05-18: qty 50

## 2015-05-18 MED ORDER — DEXTROSE 5 % IV SOLN
1.0000 g | Freq: Once | INTRAVENOUS | Status: DC
  Filled 2015-05-18: qty 10

## 2015-05-18 MED ORDER — CYCLOBENZAPRINE HCL 10 MG PO TABS
5.0000 mg | ORAL_TABLET | Freq: Three times a day (TID) | ORAL | Status: DC | PRN
Start: 2015-05-18 — End: 2015-05-22

## 2015-05-18 MED ORDER — PIPERACILLIN-TAZOBACTAM 3.375 G IVPB
3.3750 g | Freq: Three times a day (TID) | INTRAVENOUS | Status: DC
  Administered 2015-05-18 – 2015-05-19 (×2): 3.375 g via INTRAVENOUS
  Filled 2015-05-18 (×3): qty 50

## 2015-05-18 MED ORDER — PREDNISONE 5 MG PO TABS
2.5000 mg | ORAL_TABLET | Freq: Every day | ORAL | Status: DC
  Administered 2015-05-19 – 2015-05-20 (×2): 2.5 mg via ORAL
  Filled 2015-05-18: qty 2
  Filled 2015-05-18 (×3): qty 1

## 2015-05-18 MED ORDER — VALACYCLOVIR HCL 500 MG PO TABS
1000.0000 mg | ORAL_TABLET | Freq: Three times a day (TID) | ORAL | Status: DC
  Administered 2015-05-18 – 2015-05-19 (×3): 1000 mg via ORAL
  Filled 2015-05-18 (×7): qty 2

## 2015-05-18 MED ORDER — ONDANSETRON HCL 4 MG/2ML IJ SOLN
4.0000 mg | Freq: Once | INTRAMUSCULAR | Status: AC
  Administered 2015-05-18: 4 mg via INTRAVENOUS

## 2015-05-18 MED ORDER — MAGNESIUM OXIDE 400 (241.3 MG) MG PO TABS
400.0000 mg | ORAL_TABLET | Freq: Every day | ORAL | Status: DC
  Administered 2015-05-20 – 2015-05-28 (×10): 400 mg via ORAL
  Filled 2015-05-18 (×10): qty 1

## 2015-05-18 MED ORDER — MORPHINE SULFATE (PF) 2 MG/ML IV SOLN
2.0000 mg | INTRAVENOUS | Status: DC | PRN
  Administered 2015-05-18 – 2015-05-20 (×3): 2 mg via INTRAVENOUS
  Filled 2015-05-18 (×3): qty 1

## 2015-05-18 MED ORDER — CALCIUM CARBONATE-VITAMIN D 500-200 MG-UNIT PO TABS
1.0000 | ORAL_TABLET | Freq: Every day | ORAL | Status: DC
  Administered 2015-05-19 – 2015-05-20 (×2): 1 via ORAL
  Filled 2015-05-18 (×3): qty 1

## 2015-05-18 MED ORDER — VANCOMYCIN HCL IN DEXTROSE 1-5 GM/200ML-% IV SOLN
1000.0000 mg | Freq: Once | INTRAVENOUS | Status: AC
  Administered 2015-05-18: 1000 mg via INTRAVENOUS
  Filled 2015-05-18: qty 200

## 2015-05-18 MED ORDER — MORPHINE SULFATE (PF) 4 MG/ML IV SOLN
4.0000 mg | Freq: Once | INTRAVENOUS | Status: AC
  Administered 2015-05-18: 4 mg via INTRAVENOUS

## 2015-05-18 MED ORDER — ASPIRIN-DIPYRIDAMOLE ER 25-200 MG PO CP12
1.0000 | ORAL_CAPSULE | Freq: Two times a day (BID) | ORAL | Status: DC
  Administered 2015-05-19 – 2015-05-28 (×19): 1 via ORAL
  Filled 2015-05-18 (×21): qty 1

## 2015-05-18 MED ORDER — GUAIFENESIN 100 MG/5ML PO SOLN: 10.0000 mL | ORAL | Status: DC | PRN

## 2015-05-18 MED ORDER — FLUTICASONE PROPIONATE 50 MCG/ACT NA SUSP
1.0000 | Freq: Two times a day (BID) | NASAL | Status: DC | PRN
  Administered 2015-05-25: 1 via NASAL
  Filled 2015-05-18: qty 16

## 2015-05-18 MED ORDER — ONDANSETRON HCL 4 MG/2ML IJ SOLN
INTRAMUSCULAR | Status: AC
  Administered 2015-05-18: 4 mg via INTRAVENOUS
  Filled 2015-05-18: qty 2

## 2015-05-18 MED ORDER — SENNOSIDES-DOCUSATE SODIUM 8.6-50 MG PO TABS
1.0000 | ORAL_TABLET | Freq: Every day | ORAL | Status: DC
  Administered 2015-05-20 – 2015-05-28 (×8): 1 via ORAL
  Filled 2015-05-18 (×8): qty 1

## 2015-05-18 MED ORDER — DEXTROSE 5 % IV SOLN
1.0000 g | INTRAVENOUS | Status: DC
  Administered 2015-05-18: 23:00:00 1 g via INTRAVENOUS
  Filled 2015-05-18 (×3): qty 10

## 2015-05-18 MED ORDER — DEXTROSE 5 % IV SOLN
250.0000 mg | INTRAVENOUS | Status: DC
  Administered 2015-05-18 – 2015-05-21 (×4): 250 mg via INTRAVENOUS
  Filled 2015-05-18 (×5): qty 250

## 2015-05-18 MED ORDER — ACETAMINOPHEN 325 MG PO TABS
650.0000 mg | ORAL_TABLET | ORAL | Status: DC | PRN
  Administered 2015-05-23 (×2): 650 mg via ORAL
  Filled 2015-05-18 (×2): qty 2

## 2015-05-18 MED ORDER — DEXTROSE 5 % IV SOLN: 500.0000 mg | Freq: Once | INTRAVENOUS | Status: DC

## 2015-05-18 MED ORDER — SODIUM CHLORIDE 0.9 % IV BOLUS (SEPSIS)
1000.0000 mL | INTRAVENOUS | Status: AC
  Administered 2015-05-18 (×2): 1000 mL via INTRAVENOUS

## 2015-05-18 MED ORDER — MORPHINE SULFATE (PF) 4 MG/ML IV SOLN
INTRAVENOUS | Status: AC
  Administered 2015-05-18: 4 mg via INTRAVENOUS
  Filled 2015-05-18: qty 1

## 2015-05-18 MED ORDER — TAMSULOSIN HCL 0.4 MG PO CAPS
0.4000 mg | ORAL_CAPSULE | Freq: Every day | ORAL | Status: DC
  Administered 2015-05-20 – 2015-05-26 (×8): 0.4 mg via ORAL
  Filled 2015-05-18 (×8): qty 1

## 2015-05-18 MED ORDER — FINASTERIDE 5 MG PO TABS
5.0000 mg | ORAL_TABLET | Freq: Every day | ORAL | Status: DC
  Administered 2015-05-19 – 2015-05-28 (×8): 5 mg via ORAL
  Filled 2015-05-18 (×9): qty 1

## 2015-05-18 MED ORDER — LORATADINE 10 MG PO TABS
10.0000 mg | ORAL_TABLET | Freq: Every day | ORAL | Status: DC
  Administered 2015-05-19 – 2015-05-28 (×8): 10 mg via ORAL
  Filled 2015-05-18 (×9): qty 1

## 2015-05-18 MED ORDER — CARVEDILOL 6.25 MG PO TABS
6.2500 mg | ORAL_TABLET | Freq: Two times a day (BID) | ORAL | Status: DC
  Administered 2015-05-19 – 2015-05-26 (×12): 6.25 mg via ORAL
  Filled 2015-05-18 (×15): qty 1

## 2015-05-18 MED ORDER — DOCUSATE SODIUM 100 MG PO CAPS
100.0000 mg | ORAL_CAPSULE | Freq: Two times a day (BID) | ORAL | Status: DC
  Administered 2015-05-19 – 2015-05-28 (×16): 100 mg via ORAL
  Filled 2015-05-18 (×17): qty 1

## 2015-05-18 MED ORDER — HEPARIN SODIUM (PORCINE) 5000 UNIT/ML IJ SOLN
5000.0000 [IU] | Freq: Three times a day (TID) | INTRAMUSCULAR | Status: DC
  Administered 2015-05-18 – 2015-05-19 (×3): 5000 [IU] via SUBCUTANEOUS
  Filled 2015-05-18 (×3): qty 1

## 2015-05-18 MED ORDER — HYDROCODONE-ACETAMINOPHEN 5-325 MG PO TABS
1.0000 | ORAL_TABLET | ORAL | Status: DC | PRN
  Administered 2015-05-18: 23:00:00 1 via ORAL
  Filled 2015-05-18 (×2): qty 1

## 2015-05-18 MED ORDER — SODIUM CHLORIDE 0.9 % IV BOLUS (SEPSIS)
500.0000 mL | INTRAVENOUS | Status: AC
  Administered 2015-05-18: 500 mL via INTRAVENOUS

## 2015-05-18 MED ORDER — VANCOMYCIN HCL IN DEXTROSE 750-5 MG/150ML-% IV SOLN
750.0000 mg | INTRAVENOUS | Status: DC
  Administered 2015-05-19: 750 mg via INTRAVENOUS
  Filled 2015-05-18: qty 150

## 2015-05-18 MED ORDER — PANTOPRAZOLE SODIUM 40 MG PO TBEC
40.0000 mg | DELAYED_RELEASE_TABLET | Freq: Every day | ORAL | Status: DC
  Administered 2015-05-19 – 2015-05-28 (×8): 40 mg via ORAL
  Filled 2015-05-18 (×9): qty 1

## 2015-05-18 MED ORDER — SIMVASTATIN 20 MG PO TABS
20.0000 mg | ORAL_TABLET | Freq: Every day | ORAL | Status: DC
  Administered 2015-05-20 – 2015-05-21 (×3): 20 mg via ORAL
  Filled 2015-05-18 (×3): qty 1

## 2015-05-18 MED ORDER — ADULT MULTIVITAMIN W/MINERALS CH
1.0000 | ORAL_TABLET | Freq: Every day | ORAL | Status: DC
  Administered 2015-05-19 – 2015-05-28 (×7): 1 via ORAL
  Filled 2015-05-18 (×9): qty 1

## 2015-05-18 NOTE — Consult Note (Signed)
ANTIBIOTIC CONSULT NOTE - INITIAL  Pharmacy Consult for vancomycin/zosyn Indication: pneumonia and rule out sepsis  No Known Allergies  Patient Measurements: Height: 5\' 7"  (170.2 cm) Weight: 175 lb (79.379 kg) IBW/kg (Calculated) : 66.1 Adjusted Body Weight: 79.4kg  Vital Signs: Temp: 99.2 F (37.3 C) (01/12 1521) Temp Source: Oral (01/12 1521) BP: 132/69 mmHg (01/12 1521) Pulse Rate: 114 (01/12 1521) Intake/Output from previous day:   Intake/Output from this shift:    Labs:  Recent Labs  05/09/2015 1535 05/23/2015 1736  WBC 43.2*  --   HGB 10.3*  --   PLT 185  --   CREATININE  --  1.77*   Estimated Creatinine Clearance: 26.3 mL/min (by C-G formula based on Cr of 1.77). No results for input(s): VANCOTROUGH, VANCOPEAK, VANCORANDOM, GENTTROUGH, GENTPEAK, GENTRANDOM, TOBRATROUGH, TOBRAPEAK, TOBRARND, AMIKACINPEAK, AMIKACINTROU, AMIKACIN in the last 72 hours.   Microbiology: No results found for this or any previous visit (from the past 720 hour(s)).  Medical History: Past Medical History  Diagnosis Date  . Hyperlipidemia   . GERD (gastroesophageal reflux disease)   . Arthritis   . CVA (cerebral infarction)   . CML (chronic myelocytic leukemia) ([facility])     Medications:  Scheduled:  . valACYclovir  1,000 mg Oral TID   Assessment: Pt is a 82 year old male with sepsis from PNA. Pharmacy consulted to dose zosyn and vancomycin  Goal of Therapy:  Vancomycin trough level 15-20 mcg/ml  Plan:  Measure antibiotic drug levels at steady state Follow up culture results vancomycin 750mg  q 24 hours starting 6 hours after inital 1g dose pt recieved in ED. continue zosyn 3.375g q 8 hours EI dosing. Will measure vancomycin trough at steady state 1/16 @ 2330. Pharmacy to continue to follow. Thank you for the consult.  Mckensi Redinger D Shirlene Andaya 05/22/2015,6:51 PM

## 2015-05-18 NOTE — H&P (Signed)
[place] [facility] [place] NAME: Lucas Newman    MR#:  [id number]  DATE OF BIRTH:  02-10-1920  DATE OF ADMISSION:  05/24/2015  PRIMARY CARE PHYSICIAN: Lavera Guise, MD   REQUESTING/REFERRING PHYSICIAN: Hinda Kehr  CHIEF COMPLAINT:   Chief Complaint  Patient presents with  . Weakness    HISTORY OF PRESENT ILLNESS: Lucas Newman  is a 82 y.o. male with a known history of hyperlipidemia, arthritis, stroke, CML- for last 1 month has admission office CML and his white cell count is going up. And he is having regular follow-up with his oncologist. For last 2 days he started having cough and gradually getting more short of breath. He also have pain on his right side lower chest which is severe, sharp shooting. In ER he was noted to have elevated white cell count up to 43,000 but on x-ray there is a pneumonia and on local exam there are some skin changes on the right lower chest suggestive of shingles so ER physician spoke to infectious disease specialist and he suggested to start on Valtrex plus antibiotic and admit him to hospital.  PAST MEDICAL HISTORY:   Past Medical History  Diagnosis Date  . Hyperlipidemia   . GERD (gastroesophageal reflux disease)   . Arthritis   . CVA (cerebral infarction)   . CML (chronic myelocytic leukemia) ([place])     PAST SURGICAL HISTORY: Past Surgical History  Procedure Laterality Date  . Appendectomy    . Replacement total knee bilateral  2002/2004  . Carotid endarterectomy Left     SOCIAL HISTORY:  Social History  Substance Use Topics  . Smoking status: Former Smoker -- 0.00 packs/day for 20 years    Quit date: 05/11/1984  . Smokeless tobacco: Never Used  . Alcohol Use: No     Comment: occasional beer/bloody mary    FAMILY HISTORY:  Family History  Problem Relation Age of Onset  . Lung cancer Brother   . Diabetes type II Mother   . Hypertension Father     DRUG ALLERGIES: No Known  Allergies  REVIEW OF SYSTEMS:   CONSTITUTIONAL: No fever, fatigue or weakness.  EYES: No blurred or double vision.  EARS, NOSE, AND THROAT: No tinnitus or ear pain.  RESPIRATORY: No cough, positive for shortness of breath, no wheezing or hemoptysis.  CARDIOVASCULAR: Severe right lower chest pain, no orthopnea, edema.  GASTROINTESTINAL: No nausea, vomiting, diarrhea or abdominal pain.  GENITOURINARY: No dysuria, hematuria.  ENDOCRINE: No polyuria, nocturia,  HEMATOLOGY: No anemia, easy bruising or bleeding SKIN: No rash or lesion. MUSCULOSKELETAL: No joint pain or arthritis.   NEUROLOGIC: No tingling, numbness, weakness.  PSYCHIATRY: No anxiety or depression.   MEDICATIONS AT HOME:  Prior to Admission medications   Medication Sig Start Date End Date Taking? Authorizing Provider  acetaminophen (TYLENOL) 650 MG CR tablet Take 650 mg by mouth every 4 (four) hours as needed for pain.    Yes Historical Provider, MD  Calcium Carb-Cholecalciferol (CALCIUM 600+D) 600-800 MG-UNIT TABS Take 1 tablet by mouth daily.   Yes Historical Provider, MD  carvedilol (COREG) 6.25 MG tablet Take 1 tablet (6.25 mg total) by mouth 2 (two) times daily with a meal. 03/28/15  Yes Aldean Jewett, MD  cyclobenzaprine (FLEXERIL) 5 MG tablet Take 1 tablet (5 mg total) by mouth 3 (three) times daily as needed for muscle spasms. 03/28/15  Yes Aldean Jewett, MD  dipyridamole-aspirin (AGGRENOX) 200-25 MG per 12 hr  capsule Take 1 capsule by mouth 2 (two) times daily.    Yes Historical Provider, MD  docusate sodium (COLACE) 100 MG capsule Take 1 capsule (100 mg total) by mouth 2 (two) times daily. 03/28/15  Yes Aldean Jewett, MD  finasteride (PROSCAR) 5 MG tablet Take 5 mg by mouth daily.   Yes Historical Provider, MD  fluticasone (FLONASE) 50 MCG/ACT nasal spray Place 1 spray into both nostrils 2 (two) times daily as needed for rhinitis.   Yes Historical Provider, MD  HYDROcodone-acetaminophen (NORCO/VICODIN)  5-325 MG tablet Take 1 tablet by mouth every 4 (four) hours as needed for moderate pain. 03/28/15  Yes Aldean Jewett, MD  loratadine (CLARITIN) 10 MG tablet Take 10 mg by mouth daily.   Yes Historical Provider, MD  Magnesium 250 MG TABS Take 250 mg by mouth at bedtime.    Yes Historical Provider, MD  Multiple Vitamin (MULTIVITAMIN WITH MINERALS) TABS tablet Take 1 tablet by mouth daily.   Yes Historical Provider, MD  omeprazole (PRILOSEC) 20 MG capsule Take 20 mg by mouth 2 (two) times daily.    Yes Historical Provider, MD  predniSONE (DELTASONE) 5 MG tablet Take 2.5 mg by mouth daily.    Yes Historical Provider, MD  senna-docusate (SENOKOT-S) 8.6-50 MG tablet Take 1 tablet by mouth at bedtime.   Yes Historical Provider, MD  simvastatin (ZOCOR) 20 MG tablet Take 20 mg by mouth at bedtime.    Yes Historical Provider, MD  tamsulosin (FLOMAX) 0.4 MG CAPS capsule Take 0.4 mg by mouth at bedtime.    Yes Historical Provider, MD  chlorpheniramine-HYDROcodone (TUSSIONEX) 10-8 MG/5ML SUER Take 5 mLs by mouth every 12 (twelve) hours as needed for cough. 03/28/15   Aldean Jewett, MD  guaiFENesin (ROBITUSSIN) 100 MG/5ML SOLN Take 10 mLs (200 mg total) by mouth every 4 (four) hours as needed for cough or to loosen phlegm. 03/28/15   Aldean Jewett, MD      PHYSICAL EXAMINATION:   VITAL SIGNS: Blood pressure 132/69, pulse 114, temperature 99.2 F (37.3 C), temperature source Oral, height 5\' 7"  (1.702 m), weight 79.379 kg (175 lb), SpO2 88 %.  GENERAL:  82 y.o.-year-old patient lying in the bed with no acute distress.  EYES: Pupils equal, round, reactive to light and accommodation. No scleral icterus. Extraocular muscles intact.  HEENT: Head atraumatic, normocephalic. Oropharynx and nasopharynx clear.  NECK:  Supple, no jugular venous distention. No thyroid enlargement, no tenderness.  LUNGS: Normal breath sounds bilaterally, no wheezing, mild crepitation. No use of accessory muscles of  respiration.    Using nasal cannula oxygen supplement. CARDIOVASCULAR: S1, S2 normal. No murmurs, rubs, or gallops.  ABDOMEN: Soft, nontender, nondistended. Bowel sounds present. No organomegaly or mass.  EXTREMITIES: No pedal edema, cyanosis, or clubbing.  NEUROLOGIC: Cranial nerves II through XII are intact. Muscle strength 5/5 in all extremities. Sensation intact. Gait not checked.  PSYCHIATRIC: The patient is alert and oriented x 3.  SKIN: On right side lower chest on the side or the lower rib cage area there is some reddish rash present but no vesicles.  LABORATORY PANEL:   CBC  Recent Labs Lab 05/28/2015 1535  WBC 43.2*  HGB 10.3*  HCT 32.1*  PLT 185  MCV 92.9  MCH 29.7  MCHC 32.0  RDW 16.1*  LYMPHSABS 1.2  MONOABS 18.0*  EOSABS 0.1  BASOSABS 0.1   ------------------------------------------------------------------------------------------------------------------  Chemistries   Recent Labs Lab 05/17/2015 1736  NA 138  K 4.4  CL  106  CO2 24  GLUCOSE 137*  BUN 31*  CREATININE 1.77*  CALCIUM 7.8*  AST 22  ALT 12*  ALKPHOS 58  BILITOT 1.1   ------------------------------------------------------------------------------------------------------------------ estimated creatinine clearance is 26.3 mL/min (by C-G formula based on Cr of 1.77). ------------------------------------------------------------------------------------------------------------------ No results for input(s): TSH, T4TOTAL, T3FREE, THYROIDAB in the last 72 hours.  Invalid input(s): FREET3   Coagulation profile  Recent Labs Lab 05/25/2015 1535  INR 1.17   ------------------------------------------------------------------------------------------------------------------- No results for input(s): DDIMER in the last 72 hours. -------------------------------------------------------------------------------------------------------------------  Cardiac Enzymes  Recent Labs Lab 05/17/2015 1736   TROPONINI <0.03   ------------------------------------------------------------------------------------------------------------------ Invalid input(s): POCBNP  ---------------------------------------------------------------------------------------------------------------  Urinalysis    Component Value Date/Time   COLORURINE YELLOW* 03/23/2015 2300   APPEARANCEUR CLEAR* 03/23/2015 2300   LABSPEC 1.014 03/23/2015 2300   PHURINE 5.0 03/23/2015 2300   GLUCOSEU NEGATIVE 03/23/2015 2300   HGBUR 1+* 03/23/2015 2300   BILIRUBINUR NEGATIVE 03/23/2015 2300   KETONESUR TRACE* 03/23/2015 2300   PROTEINUR 30* 03/23/2015 2300   NITRITE NEGATIVE 03/23/2015 2300   LEUKOCYTESUR NEGATIVE 03/23/2015 2300     RADIOLOGY: Dg Chest Port 1 View  05/26/2015  CLINICAL DATA:  Right-sided rib pain beginning yesterday. EXAM: PORTABLE CHEST - 1 VIEW COMPARISON:  Two-view chest x-ray 03/20/2015 and CT chest 03/24/2015. FINDINGS: The heart is mildly enlarged. Atherosclerotic calcifications are present at the aortic arch. A large right pleural effusion is present. Bibasilar airspace disease is worse on the right. Right upper lobe airspace disease is also present. The visualized soft tissues and bony thorax are unremarkable. IMPRESSION: Right greater than left pleural effusions and basilar airspace disease, concerning for pneumonia. No acute osseous abnormality. Electronically Signed   By: San Morelle M.D.   On: 05/26/2015 15:53    IMPRESSION AND PLAN:  * Acute respiratory failure with hypoxia secondary to pneumonia  Oxygen level noted to be 88 on room air in ER.  I will give him Rocephin and is through myself for community-acquired pneumonia.  Cultures sent from ER.   * Shingles  History of cancer, severe shooting pain with skin changes.  ER physician spoke to infectious disease and started on Valtrex.  Continue for now and monitor renal function.  Airborne isolation.  * CML  He follows with  oncology clinic regularly and currently he has recurrence.  I will call oncology consult to follow in the hospital.  * History of stroke  He takes Aggrenox at home, continue that.  * Severe chest pain  Secondary to shingles and pneumonia  We will give IV morphine, Norco and as Tylenol- depending on pain severity.  * Hyperlipidemia  Continue statin.    All the records are reviewed and case discussed with ED provider. Management plans discussed with the patient, family and they are in agreement.  CODE STATUS: Code Status History    Date Active Date Inactive Code Status Order ID Comments User Context   03/24/2015  4:09 AM 03/28/2015  6:52 PM DNR [id number]  Harrie Foreman, MD Inpatient   09/04/2014  3:11 PM 09/07/2014  7:09 PM Full Code [id number]  Fritzi Mandes, MD ED    Questions for Most Recent Historical Code Status (Order [id number])    Question Answer Comment   In the event of cardiac or respiratory ARREST Do not call a "code blue"    In the event of cardiac or respiratory ARREST Do not perform Intubation, CPR, defibrillation or ACLS    In the event of cardiac or respiratory ARREST Use  medication by any route, position, wound care, and other measures to relive pain and suffering. May use oxygen, suction and manual treatment of airway obstruction as needed for comfort.     Advance Directive Documentation        Most Recent Value   Type of Advance Directive  Healthcare Power of Attorney, Living will   Pre-existing out of facility DNR order (yellow form or pink MOST form)     "MOST" Form in Place?         TOTAL TIME TAKING CARE OF THIS PATIENT: 50 minutes.    Vaughan Basta M.D on 05/28/2015   Between 7am to 6pm - Pager - [phone]  After 6pm go to [email or web address] - password [email or web address] [facility]  Office  [phone]  CC: Primary care physician; Lavera Guise, MD   Note: This dictation was prepared with Dragon dictation along with smaller  phrase technology. Any transcriptional errors that result from this process are unintentional.

## 2015-05-18 NOTE — ED Notes (Signed)
Pt brought in via EMS from home; pt w/ increased weakness, decreased urine output, pain to right ribcage since last night.  Pt A/Ox4, in no immediate distress.  MD at bedside.

## 2015-05-18 NOTE — ED Provider Notes (Signed)
[facility] Emergency Department Provider Note  ____________________________________________  Time seen: Approximately 3:30 PM  I have reviewed the triage vital signs and the nursing notes.   HISTORY  Chief Complaint Weakness    HPI Lucas Newman is a 82 y.o. 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  He presents by EMS today for generalized weakness, decreased urine output, shortness of breath, and pain in his right rib cage as well as his right shoulder starting yesterday.  The patient is alert and oriented but appears uncomfortable.  Other than the complaints described above he has no other complaints; specifically he denies chest pain, fever/chills, abdominal pain, dysuria.  He is not sure about his history of leukemia but he did say that he has recently seen a doctor at the cancer center about it.  Upon arrival he has a pulse in the 110 but a normal blood pressure.  Past Medical History  Diagnosis Date  . Hyperlipidemia   . GERD (gastroesophageal reflux disease)   . Arthritis   . CVA (cerebral infarction)   . CML (chronic myelocytic leukemia) [facility])     Patient Active Problem List   Diagnosis Date Noted  . 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 Fall 03/24/2015  . Cough 05/11/2014  . Shortness of breath 05/11/2014  . Temporal arteritis ([place]) 05/11/2014    Past Surgical History  Procedure Laterality Date  . Appendectomy    . Replacement total knee bilateral  2002/2004  . Carotid endarterectomy Left     Current Outpatient Rx  Name  Route  Sig  Dispense  Refill  . acetaminophen (TYLENOL) 650 MG CR tablet   Oral   Take 650 mg by mouth every 4 (four) hours as needed for pain.          . Calcium Carb-Cholecalciferol (CALCIUM 600+D) 600-800 MG-UNIT TABS   Oral   Take 1  tablet by mouth daily.         . carvedilol (COREG) 6.25 MG tablet   Oral   Take 1 tablet (6.25 mg total) by mouth 2 (two) times daily with a meal.   60 tablet   0   . cyclobenzaprine (FLEXERIL) 5 MG tablet   Oral   Take 1 tablet (5 mg total) by mouth 3 (three) times daily as needed for muscle spasms.   30 tablet   0   . dipyridamole-aspirin (AGGRENOX) 200-25 MG per 12 hr capsule   Oral   Take 1 capsule by mouth 2 (two) times daily.          Marland Kitchen docusate sodium (COLACE) 100 MG capsule   Oral   Take 1 capsule (100 mg total) by mouth 2 (two) times daily.   10 capsule   0   . finasteride (PROSCAR) 5 MG tablet   Oral   Take 5 mg by mouth daily.         . fluticasone (FLONASE) 50 MCG/ACT nasal spray   Each Nare   Place 1 spray into both nostrils 2 (two) times daily as needed for rhinitis.         Marland Kitchen HYDROcodone-acetaminophen (NORCO/VICODIN) 5-325 MG tablet   Oral   Take 1 tablet by mouth every 4 (four) hours as needed for moderate pain.   30 tablet   0   .  loratadine (CLARITIN) 10 MG tablet   Oral   Take 10 mg by mouth daily.         . Magnesium 250 MG TABS   Oral   Take 250 mg by mouth at bedtime.          . Multiple Vitamin (MULTIVITAMIN WITH MINERALS) TABS tablet   Oral   Take 1 tablet by mouth daily.         Marland Kitchen omeprazole (PRILOSEC) 20 MG capsule   Oral   Take 20 mg by mouth 2 (two) times daily.          . predniSONE (DELTASONE) 5 MG tablet   Oral   Take 2.5 mg by mouth daily.          Marland Kitchen senna-docusate (SENOKOT-S) 8.6-50 MG tablet   Oral   Take 1 tablet by mouth at bedtime.         . simvastatin (ZOCOR) 20 MG tablet   Oral   Take 20 mg by mouth at bedtime.          . tamsulosin (FLOMAX) 0.4 MG CAPS capsule   Oral   Take 0.4 mg by mouth at bedtime.          . chlorpheniramine-HYDROcodone (TUSSIONEX) 10-8 MG/5ML SUER   Oral   Take 5 mLs by mouth every 12 (twelve) hours as needed for cough.   140 mL   0   . guaiFENesin  (ROBITUSSIN) 100 MG/5ML SOLN   Oral   Take 10 mLs (200 mg total) by mouth every 4 (four) hours as needed for cough or to loosen phlegm.   1200 mL   0     Allergies Review of patient's allergies indicates no known allergies.  Family History  Problem Relation Age of Onset  . Lung cancer Brother   . Diabetes type II Mother   . Hypertension Father     Social History Social History  Substance Use Topics  . Smoking status: Former Smoker -- 0.00 packs/day for 20 years    Quit date: 05/11/1984  . Smokeless tobacco: Never Used  . Alcohol Use: No     Comment: occasional beer/bloody mary    Review of Systems Constitutional: No fever/chills Eyes: No visual changes. ENT: No sore throat. Cardiovascular: Denies chest pain.  Right chest wall pain. Respiratory: Mild shortness of breath. Gastrointestinal: No abdominal pain.  No nausea, no vomiting.  No diarrhea.  No constipation. Genitourinary: Negative for dysuria.  Decreased urine output Musculoskeletal: Posterior lateral chest wall pain and pain in his right shoulder. Skin: Negative for rash [of which he was aware] Neurological: Negative for headaches, focal weakness or numbness.  10-point ROS otherwise negative.  ____________________________________________   PHYSICAL EXAM:  VITAL SIGNS: ED Triage Vitals  Enc Vitals Group     BP 05/16/2015 1521 132/69 mmHg     Pulse Rate 05/13/2015 1521 114     Resp --      Temp 05/31/2015 1521 99.2 F (37.3 C)     Temp Source 05/09/2015 1521 Oral     SpO2 05/07/2015 1521 88 %     Weight 06/04/2015 1521 175 lb (79.379 kg)     Height 05/28/2015 1521 5\' 7"  (1.702 m)     Head Cir --      Peak Flow --      Pain Score --      Pain Loc --      Pain Edu? --      Excl. in [place]? --  Constitutional: Alert and oriented. Mildly ill appearing in some discomfort Eyes: Conjunctivae are normal. PERRL. EOMI. Head: Atraumatic. Nose: No congestion/rhinnorhea. Mouth/Throat: Mucous membranes are dry.   Oropharynx non-erythematous. Neck: No stridor.  No meningismus Cardiovascular: Tachycardia, regular rhythm. Grossly normal heart sounds.  Good peripheral circulation. Respiratory: Normal respiratory effort.  No retractions. Lungs CTAB. Gastrointestinal: Soft and nontender. No distention. No abdominal bruits. No CVA tenderness. Musculoskeletal: No lower extremity tenderness nor edema.  No joint effusions. Neurologic:  Normal speech and language. No gross focal neurologic deficits are appreciated.  Skin:  Skin is warm, dry and intact. Erythematous macular rash in multiple dermatomes, primarily right lateral ribs extending to midline of back, a similar patch around right side of chest, and another patch in the area of tenderness of right shoulder.  The rash is severely tender to light touch.  There are no pustules or vesicles at this time.  I attempted to take a picture of this rash via the [facility] app, but the server would not save the photo directly into [facility]. Psychiatric: Mood and affect are normal. Speech and behavior are normal.  ____________________________________________   LABS (all labs ordered are listed, but only abnormal results are displayed)  Labs Reviewed  CBC WITH DIFFERENTIAL/PLATELET - Abnormal; Notable for the following:    WBC 43.2 (*)    RBC 3.45 (*)    Hemoglobin 10.3 (*)    HCT 32.1 (*)    RDW 16.1 (*)    Neutro Abs 23.9 (*)    Monocytes Absolute 18.0 (*)    All other components within normal limits  PROTIME-INR - Abnormal; Notable for the following:    Prothrombin Time 15.1 (*)    All other components within normal limits  COMPREHENSIVE METABOLIC PANEL - Abnormal; Notable for the following:    Glucose, Bld 137 (*)    BUN 31 (*)    Creatinine, Ser 1.77 (*)    Calcium 7.8 (*)    Total Protein 6.0 (*)    Albumin 2.4 (*)    ALT 12 (*)    GFR calc non Af Amer 31 (*)    GFR calc Af Amer 36 (*)    All other components within normal limits  CULTURE, BLOOD (ROUTINE X  2)  CULTURE, BLOOD (ROUTINE X 2)  URINE CULTURE  LACTIC ACID, PLASMA  APTT  LIPASE, BLOOD  TROPONIN I  LACTIC ACID, PLASMA  URINALYSIS COMPLETEWITH MICROSCOPIC ([facility] ONLY)   ____________________________________________  EKG  ED ECG REPORT I, Tila Millirons, the attending physician, personally viewed and interpreted this ECG.   Date: 05/19/2015  EKG Time: 15:32  Rate: 114  Rhythm: sinus tachycardia  Axis: Left axis deviation  Intervals:right bundle branch block  ST&T Change: T wave inversions in leads 3, otherwise no indication of acute ischemia  ____________________________________________  RADIOLOGY   Dg Chest Port 1 View  05/07/2015  CLINICAL DATA:  Right-sided rib pain beginning yesterday. EXAM: PORTABLE CHEST - 1 VIEW COMPARISON:  Two-view chest x-ray 03/20/2015 and CT chest 03/24/2015. FINDINGS: The heart is mildly enlarged. Atherosclerotic calcifications are present at the aortic arch. A large right pleural effusion is present. Bibasilar airspace disease is worse on the right. Right upper lobe airspace disease is also present. The visualized soft tissues and bony thorax are unremarkable. IMPRESSION: Right greater than left pleural effusions and basilar airspace disease, concerning for pneumonia. No acute osseous abnormality. Electronically Signed   By: San Morelle M.D.   On: 05/11/2015 15:53    ____________________________________________  PROCEDURES  Procedure(s) performed: None  Critical Care performed: Yes, see critical care note(s)   CRITICAL CARE Performed by: Hinda Kehr   Total critical care time: 45 minutes  Critical care time was exclusive of separately billable procedures and treating other patients.  Critical care was necessary to treat or prevent imminent or life-threatening deterioration.  Critical care was time spent personally by me on the following activities: development of treatment plan with patient and/or surrogate as well as  nursing, discussions with consultants, evaluation of patient's response to treatment, examination of patient, obtaining history from patient or surrogate, ordering and performing treatments and interventions, ordering and review of laboratory studies, ordering and review of radiographic studies, pulse oximetry and re-evaluation of patient's condition.  ____________________________________________   INITIAL IMPRESSION / ASSESSMENT AND PLAN / ED COURSE  Pertinent labs & imaging results that were available during my care of the patient were reviewed by me and considered in my medical decision making (see chart for details).  3:19 PM Upon initial presentation, the patient meets sepsis criteria based on hypoxemia and tachycardia.  He reportedly has some form of leukemia which puts him at even higher risk.  We will proceed with sepsis workup though he is not hypotensive and it does not appear to be severe sepsis at this time.  Additionally, he developing rash on his trunk is concerning for early herpes zoster.  I consulted with Dr. Ola Spurr, the infectious disease doctor, by phone, and he recommended that we treat it empirically as early zoster based on the history I described to him even though the appearance of it is atypical.  He recommended Valtrex 1000 mg 3 times a day and anticipates that the rash will develop and a ruptured into vesicles.  His recommendation was that while in the emergency department the patient does not require any special precautions other than keeping the rash covered with a gown, but upon admission to the hospital he should be placed in a negative pressure room with airborne precautions as the rash will likely develop.  I am proceeding with the rest of the sepsis workup anticipating a probable pneumonia given the patient's hypoxemia and complaints of shortness of breath.  He is normotensive at this time and is alert and oriented.  Empiric abx, fluids, labs, etc.   (Note that  documentation was delayed due to multiple ED patients requiring immediate care including a STEMI from triage.)  6:18 PM.  The patient remains hemodynamically stable.  It appears that he has pneumonia, developing herpes zoster shingles, severe leukocytosis in the setting of both infection as well as CMML/MDS, remains alert and oriented.  He received Zosyn and vancomycin as well as Valtrex per Dr. Blane Ohara recommendations.  I have spoken with the hospitalist for admission.  I have updated the family.  ____________________________________________  FINAL CLINICAL IMPRESSION(S) / ED DIAGNOSES  Final diagnoses:  Sepsis, due to unspecified organism ([place])  Pneumonia of right upper lobe due to infectious organism  Pleural effusion, bilateral  Leukocytosis  Chronic myelomonocytic leukemia, in relapse (HCC)  Shingles rash  Chronic kidney disease, unspecified stage      NEW MEDICATIONS STARTED DURING THIS VISIT:  New Prescriptions   No medications on file     Hinda Kehr, MD 06/02/2015 1819

## 2015-05-19 DIAGNOSIS — C9312 Chronic myelomonocytic leukemia, in relapse: Secondary | ICD-10-CM | POA: Insufficient documentation

## 2015-05-19 DIAGNOSIS — R0602 Shortness of breath: Secondary | ICD-10-CM

## 2015-05-19 DIAGNOSIS — Z87891 Personal history of nicotine dependence: Secondary | ICD-10-CM

## 2015-05-19 DIAGNOSIS — J189 Pneumonia, unspecified organism: Secondary | ICD-10-CM

## 2015-05-19 DIAGNOSIS — R05 Cough: Secondary | ICD-10-CM

## 2015-05-19 DIAGNOSIS — D469 Myelodysplastic syndrome, unspecified: Secondary | ICD-10-CM

## 2015-05-19 DIAGNOSIS — B029 Zoster without complications: Secondary | ICD-10-CM

## 2015-05-19 LAB — BASIC METABOLIC PANEL
Anion gap: 8 (ref 5–15)
BUN: 30 mg/dL — AB (ref 6–20)
CHLORIDE: 104 mmol/L (ref 101–111)
CO2: 22 mmol/L (ref 22–32)
Calcium: 7.4 mg/dL — ABNORMAL LOW (ref 8.9–10.3)
Creatinine, Ser: 1.79 mg/dL — ABNORMAL HIGH (ref 0.61–1.24)
GFR calc Af Amer: 36 mL/min — ABNORMAL LOW (ref >60)
GFR calc non Af Amer: 31 mL/min — ABNORMAL LOW (ref >60)
GLUCOSE: 144 mg/dL — AB (ref 65–99)
POTASSIUM: 3.7 mmol/L (ref 3.5–5.1)
SODIUM: 134 mmol/L — AB (ref 135–145)

## 2015-05-19 LAB — CBC
HEMATOCRIT: 26.8 % — AB (ref 40.0–52.0)
Hemoglobin: 8.4 g/dL — ABNORMAL LOW (ref 13.0–18.0)
MCH: 28.7 pg (ref 26.0–34.0)
MCHC: 31.1 g/dL — ABNORMAL LOW (ref 32.0–36.0)
MCV: 92.3 fL (ref 80.0–100.0)
Platelets: 165 10*3/uL (ref 150–440)
RBC: 2.91 MIL/uL — ABNORMAL LOW (ref 4.40–5.90)
RDW: 16 % — AB (ref 11.5–14.5)
WBC: 43.1 10*3/uL — AB (ref 3.8–10.6)

## 2015-05-19 MED ORDER — ENOXAPARIN SODIUM 30 MG/0.3ML ~~LOC~~ SOLN
30.0000 mg | SUBCUTANEOUS | Status: DC
  Administered 2015-05-20 (×2): 30 mg via SUBCUTANEOUS
  Filled 2015-05-19 (×2): qty 0.3

## 2015-05-19 MED ORDER — DEXTROSE 5 % IV SOLN
1.0000 g | INTRAVENOUS | Status: DC
  Administered 2015-05-20: 1 g via INTRAVENOUS
  Filled 2015-05-19 (×2): qty 10

## 2015-05-19 MED ORDER — PIPERACILLIN-TAZOBACTAM 3.375 G IVPB
3.3750 g | Freq: Three times a day (TID) | INTRAVENOUS | Status: DC
  Filled 2015-05-19 (×3): qty 50

## 2015-05-19 MED ORDER — ONDANSETRON HCL 4 MG/2ML IJ SOLN: 4.0000 mg | Freq: Four times a day (QID) | INTRAMUSCULAR | Status: DC | PRN

## 2015-05-19 MED ORDER — VALACYCLOVIR HCL 500 MG PO TABS
1000.0000 mg | ORAL_TABLET | Freq: Every day | ORAL | Status: DC
  Administered 2015-05-20 – 2015-05-24 (×3): 1000 mg via ORAL
  Filled 2015-05-19 (×4): qty 2

## 2015-05-19 MED ORDER — VANCOMYCIN HCL IN DEXTROSE 750-5 MG/150ML-% IV SOLN
750.0000 mg | INTRAVENOUS | Status: DC
  Filled 2015-05-19: qty 150

## 2015-05-19 NOTE — Plan of Care (Signed)
Problem: Education: Goal: Knowledge of [facility] Education information/materials will improve Outcome: Progressing Patient admitted for possible shingles.  Education provided on illness and medications.  Patient placed on airborne precautions and is in negative-pressure isolation room.  Education given to family about using PPE before entering room.  Problem: Safety: Goal: Ability to remain free from injury will improve Outcome: Progressing Patient is on high fall risk as he has had several falls at home in the past 2 months.  Patient on high fall protocol with bed alarm on throughout shift.  Patient compliant with calling for assistance to get out of bed to use the bathroom.    Problem: Pain Managment: Goal: General experience of comfort will improve Outcome: Progressing Patient with complaints of severe pain to chest and radiating to back in the location of the shingles rash.  Patient given medication per eMAR.  Patient with Morphine, Oxycodone and Tylenol available.  Patient able to rest comfortably throughout shift.

## 2015-05-19 NOTE — Progress Notes (Addendum)
Patient ID: Lucas Newman, male   DOB: Jun 14, 1919, 82 y.o.   MRN: [id number] [facility] PROGRESS NOTE  Lucas Newman [id number] DOB: 10/10/1919 DOA: 05/09/2015 PCP: Lavera Guise, MD  HPI/Subjective: Patient states that he is sick. Does not feel well at all. Some shortness of breath and cough.  Objective: Filed Vitals:   05/19/15 0813 05/19/15 1315  BP: 121/58 108/55  Pulse: 103 94  Temp:  98 F (36.7 C)  Resp: 18 20    Filed Weights   06/05/2015 1521 05/12/2015 2022  Weight: 79.379 kg (175 lb) 83.371 kg (183 lb 12.8 oz)    ROS: Review of Systems  Constitutional: Negative for fever and chills.  Eyes: Negative for blurred vision.  Respiratory: Positive for cough and shortness of breath.   Cardiovascular: Negative for chest pain.  Gastrointestinal: Negative for nausea, vomiting, abdominal pain, diarrhea and constipation.  Genitourinary: Negative for dysuria.  Musculoskeletal: Negative for joint pain.  Neurological: Negative for dizziness and headaches.   Exam: Physical Exam  Constitutional: He is oriented to person, place, and time.  HENT:  Nose: No mucosal edema.  Mouth/Throat: No oropharyngeal exudate or posterior oropharyngeal edema.  Eyes: Conjunctivae, EOM and lids are normal. Pupils are equal, round, and reactive to light.  Neck: No JVD present. Carotid bruit is not present. No edema present. No thyroid mass and no thyromegaly present.  Cardiovascular: S1 normal and S2 normal.  Exam reveals no gallop.   No murmur heard. Pulses:      Dorsalis pedis pulses are 2+ on the right side, and 2+ on the left side.  Respiratory: No respiratory distress. He has no wheezes. He has rhonchi in the right lower field and the left lower field. He has no rales.  GI: Soft. Bowel sounds are normal. There is no tenderness.  Musculoskeletal:       Right ankle: He exhibits no swelling.       Left ankle: He exhibits no swelling.  Lymphadenopathy:    He has no  cervical adenopathy.  Neurological: He is alert and oriented to person, place, and time. No cranial nerve deficit.  Skin: Skin is warm. Nails show no clubbing.  Bruising seen on arms  Psychiatric: He has a normal mood and affect.    Data Reviewed: Basic Metabolic Panel:  Recent Labs Lab 06/03/2015 1736 05/19/15 0633  NA 138 134*  K 4.4 3.7  CL 106 104  CO2 24 22  GLUCOSE 137* 144*  BUN 31* 30*  CREATININE 1.77* 1.79*  CALCIUM 7.8* 7.4*   Liver Function Tests:  Recent Labs Lab 06/04/2015 1736  AST 22  ALT 12*  ALKPHOS 58  BILITOT 1.1  PROT 6.0*  ALBUMIN 2.4*    Recent Labs Lab 05/23/2015 1736  LIPASE 18   CBC:  Recent Labs Lab 05/19/2015 1535 05/19/15 0633  WBC 43.2* 43.1*  NEUTROABS 23.9*  --   HGB 10.3* 8.4*  HCT 32.1* 26.8*  MCV 92.9 92.3  PLT 185 165   Cardiac Enzymes:  Recent Labs Lab 06/02/2015 1736  TROPONINI <0.03   BNP (last 3 results)  Recent Labs  09/04/14 1200 03/20/15 1553  BNP 172.0* 125.0*      Recent Results (from the past 240 hour(s))  Blood Culture (routine x 2)     Status: None (Preliminary result)   Collection Time: 05/08/2015  3:35 PM  Result Value Ref Range Status   Specimen Description BLOOD LEFT WRIST  Final   Special Requests  Final    BOTTLES DRAWN AEROBIC AND ANAEROBIC ANA 5CC AERO [place]   Culture NO GROWTH < 24 HOURS  Final   Report Status PENDING  Incomplete  Blood Culture (routine x 2)     Status: None (Preliminary result)   Collection Time: 05/10/2015  3:36 PM  Result Value Ref Range Status   Specimen Description BLOOD RIGHT HAND  Final   Special Requests   Final    BOTTLES DRAWN AEROBIC AND ANAEROBIC ANA 2CC AERO 3CC   Culture NO GROWTH < 24 HOURS  Final   Report Status PENDING  Incomplete  Urine culture     Status: None (Preliminary result)   Collection Time: 05/21/2015  8:52 PM  Result Value Ref Range Status   Specimen Description URINE, RANDOM  Final   Special Requests NONE  Final   Culture NO GROWTH < 24  HOURS  Final   Report Status PENDING  Incomplete     Studies: Dg Chest Port 1 View  06/01/2015  CLINICAL DATA:  Right-sided rib pain beginning yesterday. EXAM: PORTABLE CHEST - 1 VIEW COMPARISON:  Two-view chest x-ray 03/20/2015 and CT chest 03/24/2015. FINDINGS: The heart is mildly enlarged. Atherosclerotic calcifications are present at the aortic arch. A large right pleural effusion is present. Bibasilar airspace disease is worse on the right. Right upper lobe airspace disease is also present. The visualized soft tissues and bony thorax are unremarkable. IMPRESSION: Right greater than left pleural effusions and basilar airspace disease, concerning for pneumonia. No acute osseous abnormality. Electronically Signed   By: San Morelle M.D.   On: 05/08/2015 15:53    Scheduled Meds: . azithromycin  250 mg Intravenous Q24H  . calcium-vitamin D  1 tablet Oral Daily  . carvedilol  6.25 mg Oral BID WC  . dipyridamole-aspirin  1 capsule Oral BID  . docusate sodium  100 mg Oral BID  . enoxaparin (LOVENOX) injection  30 mg Subcutaneous Q24H  . finasteride  5 mg Oral Daily  . loratadine  10 mg Oral Daily  . magnesium oxide  400 mg Oral QHS  . multivitamin with minerals  1 tablet Oral Daily  . pantoprazole  40 mg Oral Daily  . predniSONE  2.5 mg Oral Daily  . senna-docusate  1 tablet Oral QHS  . simvastatin  20 mg Oral QHS  . tamsulosin  0.4 mg Oral QHS  . [START ON 05/20/2015] valACYclovir  1,000 mg Oral Daily   Assessment/Plan:  1. Acute respiratory failure with hypoxia. Oxygen supplementation 2. Clinical sepsis present on admission with pneumonia, tachycardia and leukocytosis. Since the patient was recently in the hospital and at rehabilitation I will get more aggressive with antibiotics with vancomycin and Zosyn and Zithromax. 3. Atrial fibrillation with rapid ventricular response on Coreg 4. CML- white count always high but higher now. 5. Hyperlipidemia unspecified on  simvastatin 6. BPH on Flomax and finasteride 7. Gastroesophageal reflux disease on Protonix 8. Shingles- pharmacy to renally dose Valtrex 9. Chronic kidney disease stage III  Code Status:     Code Status Orders        Start     Ordered   05/11/2015 2003  Full code   Continuous     06/06/2015 2002    Code Status History    Date Active Date Inactive Code Status Order ID Comments User Context   03/24/2015  4:09 AM 03/28/2015  6:52 PM DNR [id number]  Harrie Foreman, MD Inpatient   09/04/2014  3:11 PM 09/07/2014  7:09 PM Full Code [id number]  Fritzi Mandes, MD ED    Advance Directive Documentation        Most Recent Value   Type of Advance Directive  Healthcare Power of Attorney, Living will   Pre-existing out of facility DNR order (yellow form or pink MOST form)     "MOST" Form in Place?       Disposition Plan: To be determined  Antibiotics:  Vancomycin  Zosyn  Zithromax  Time spent: 25 minutes  Loletha Grayer  [facility] Hospitalists

## 2015-05-19 NOTE — Care Management (Signed)
Admitted to [facility] with the diagnosis of pneumonia. Discharged from this facility to [facility] [place] 03/28/15. Lives at  Home. Daughter is International aid/development worker. Spouse is [name] [phone]). Spouse was in a facility last admission. Declines to be transferred to [facility]. Refusal of Transfer to [facility] signed, will fax to [facility] office. States he still gets a Psychologist, counselling for 5 hours 7 days from [facility]. Uses a rolling walker and  Wheelchair to aid in ambulation. Shower chair present in the home.  Shelbie Ammons RN MSN CCM Care Management [phone]

## 2015-05-19 NOTE — Consult Note (Signed)
[place] NOTE  Patient Care Team: Lavera Guise, MD as PCP - General (Internal Medicine)  CHIEF COMPLAINTS/PURPOSE OF CONSULTATION: CMML  # SUMMARY OF ONCOLOGIC HISTORY:  # MAY 2016- CMML type-1/MDS [wbc/monocytosis- 30~; Hb8-9; platelets- N] s/p BMBx [dr.Pandit]  # CKD creatinine ~1.8-2.0/ CHF  HISTORY OF PRESENTING ILLNESS:  Lucas Newman 82 y.o.  male with a history of chronic myelo monocytic leukemia currently on surveillance/as needed PRBC transfusion is currently admitted to the hospital for worsening shortness of breath cough. He was noted to have a right more than left basilar infiltrate concerning for pneumonia. Patient's white count was up around 40,000. He is currently admitted to the hospital for further management. He is also on antibiotics. Patient also complained of shooting pain in his right chest wall; he had small lesions on his chest wall- concerning for shingles. Is also on Valtrex.  Patient continues to have shortness of breath especially with exertion; and also cough. No hemoptysis. He feels weak all over. No nausea and vomiting. Patient plans to go to live with his son at discharge.  ROS: A complete 10 point review of system is done which is negative except mentioned above in history of present illness  MEDICAL HISTORY:  Past Medical History  Diagnosis Date  . Hyperlipidemia   . GERD (gastroesophageal reflux disease)   . Arthritis   . CVA (cerebral infarction)   . CML (chronic myelocytic leukemia) ([place])     SURGICAL HISTORY: Past Surgical History  Procedure Laterality Date  . Appendectomy    . Replacement total knee bilateral  2002/2004  . Carotid endarterectomy Left     SOCIAL HISTORY: Social History   Social History  . Marital Status: Married    Spouse Name: N/A  . Number of Children: N/A  . Years of Education: N/A   Occupational History  . Not on file.   Social History Main Topics  . Smoking status: Former  Smoker -- 0.00 packs/day for 20 years    Quit date: 05/11/1984  . Smokeless tobacco: Never Used  . Alcohol Use: No     Comment: occasional beer/bloody mary  . Drug Use: No  . Sexual Activity: Not on file   Other Topics Concern  . Not on file   Social History Narrative    FAMILY HISTORY: Family History  Problem Relation Age of Onset  . Lung cancer Brother   . Diabetes type II Mother   . Hypertension Father     ALLERGIES:  has No Known Allergies.  MEDICATIONS:  Current Facility-Administered Medications  Medication Dose Route Frequency Provider Last Rate Last Dose  . acetaminophen (TYLENOL) tablet 650 mg  650 mg Oral Q4H PRN Vaughan Basta, MD      . azithromycin (ZITHROMAX) 250 mg in dextrose 5 % 125 mL IVPB  250 mg Intravenous Q24H Vaughan Basta, MD   250 mg at 05/10/2015 2233  . calcium-vitamin D (OSCAL WITH D) 500-200 MG-UNIT per tablet 1 tablet  1 tablet Oral Daily Vaughan Basta, MD   1 tablet at 05/19/15 0808  . carvedilol (COREG) tablet 6.25 mg  6.25 mg Oral BID WC Vaughan Basta, MD   6.25 mg at 05/19/15 0808  . cefTRIAXone (ROCEPHIN) 1 g in dextrose 5 % 50 mL IVPB  1 g Intravenous Q24H Vaughan Basta, MD   1 g at 05/28/2015 2233  . chlorpheniramine-HYDROcodone (TUSSIONEX) 10-8 MG/5ML suspension 5 mL  5 mL Oral Q12H PRN Vaughan Basta, MD      .  cyclobenzaprine (FLEXERIL) tablet 5 mg  5 mg Oral TID PRN Vaughan Basta, MD      . dipyridamole-aspirin (AGGRENOX) 200-25 MG per 12 hr capsule 1 capsule  1 capsule Oral BID Vaughan Basta, MD   1 capsule at 05/19/15 0809  . docusate sodium (COLACE) capsule 100 mg  100 mg Oral BID Vaughan Basta, MD   100 mg at 05/19/15 0808  . finasteride (PROSCAR) tablet 5 mg  5 mg Oral Daily Vaughan Basta, MD   5 mg at 05/19/15 0808  . fluticasone (FLONASE) 50 MCG/ACT nasal spray 1 spray  1 spray Each Nare BID PRN Vaughan Basta, MD      . guaiFENesin (ROBITUSSIN) 100  MG/5ML solution 200 mg  10 mL Oral Q4H PRN Vaughan Basta, MD      . heparin injection 5,000 Units  5,000 Units Subcutaneous 3 times per day Vaughan Basta, MD   5,000 Units at 05/19/15 0538  . HYDROcodone-acetaminophen (NORCO/VICODIN) 5-325 MG per tablet 1 tablet  1 tablet Oral Q4H PRN Vaughan Basta, MD   1 tablet at 05/15/2015 2255  . loratadine (CLARITIN) tablet 10 mg  10 mg Oral Daily Vaughan Basta, MD   10 mg at 05/19/15 0808  . magnesium oxide (MAG-OX) tablet 400 mg  400 mg Oral QHS Vaughan Basta, MD   400 mg at 05/21/2015 2122  . morphine 2 MG/ML injection 2 mg  2 mg Intravenous Q4H PRN Vaughan Basta, MD   2 mg at 05/19/15 0539  . multivitamin with minerals tablet 1 tablet  1 tablet Oral Daily Vaughan Basta, MD   1 tablet at 05/19/15 0808  . ondansetron (ZOFRAN) injection 4 mg  4 mg Intravenous Q6H PRN Harrie Foreman, MD      . pantoprazole (PROTONIX) EC tablet 40 mg  40 mg Oral Daily Vaughan Basta, MD   40 mg at 05/19/15 0808  . predniSONE (DELTASONE) tablet 2.5 mg  2.5 mg Oral Daily Vaughan Basta, MD   2.5 mg at 05/19/15 0809  . senna-docusate (Senokot-S) tablet 1 tablet  1 tablet Oral QHS Vaughan Basta, MD   1 tablet at 05/23/2015 2129  . simvastatin (ZOCOR) tablet 20 mg  20 mg Oral QHS Vaughan Basta, MD   20 mg at 05/24/2015 2129  . tamsulosin (FLOMAX) capsule 0.4 mg  0.4 mg Oral QHS Vaughan Basta, MD   0.4 mg at 06/02/2015 2129  . valACYclovir (VALTREX) tablet 1,000 mg  1,000 mg Oral TID Hinda Kehr, MD   1,000 mg at 05/19/15 0809      .  PHYSICAL EXAMINATION:   Filed Vitals:   05/19/15 0547 05/19/15 0813  BP: 104/46 121/58  Pulse: 108 103  Temp: 97.7 F (36.5 C)   Resp: 18 18   Filed Weights   05/31/2015 1521 06/01/2015 2022  Weight: 175 lb (79.379 kg) 183 lb 12.8 oz (83.371 kg)    GENERAL: Elderly Caucasian male patient Well-nourished well-developed; Alert, no distress and comfortable.     EYES: no pallor or icterus OROPHARYNX: no thrush or ulceration NECK: supple, no masses felt LYMPH:  no palpable lymphadenopathy in the cervical, axillary or inguinal regions LUNGS: Decreased breath sounds bilaterally at the bases. HEART/CVS: regular rate & rhythm and no murmurs; No lower extremity edema ABDOMEN: abdomen soft, non-tender and normal bowel sounds Musculoskeletal:no cyanosis of digits and no clubbing  PSYCH: alert & oriented x 3 with fluent speech NEURO: no focal motor/sensory deficits SKIN:  Few macular lesions noted on the right chest wall.  LABORATORY DATA:  I have reviewed the data as listed Lab Results  Component Value Date   WBC 43.1* 05/19/2015   HGB 8.4* 05/19/2015   HCT 26.8* 05/19/2015   MCV 92.3 05/19/2015   PLT 165 05/19/2015    Recent Labs  03/23/15 2310  04/26/15 1454 05/07/2015 1736 05/19/15 0633  NA 142  < > 136 138 134*  K 4.1  < > 3.8 4.4 3.7  CL 107  < > 102 106 104  CO2 23  < > 24 24 22   GLUCOSE 147*  < > 143* 137* 144*  BUN 28*  < > 35* 31* 30*  CREATININE 1.84*  < > 1.76* 1.77* 1.79*  CALCIUM 8.9  < > 8.5* 7.8* 7.4*  GFRNONAA 30*  < > 32* 31* 31*  GFRAA 35*  < > 37* 36* 36*  PROT 7.2  --  6.9 6.0*  --   ALBUMIN 3.2*  --  2.9* 2.4*  --   AST 16  --  13* 22  --   ALT 10*  --  10* 12*  --   ALKPHOS 50  --  64 58  --   BILITOT 0.5  --  0.4 1.1  --   < > = values in this interval not displayed.  RADIOGRAPHIC STUDIES: I have personally reviewed the radiological images as listed and agreed with the findings in the report. Dg Chest Port 1 View  06/02/2015  CLINICAL DATA:  Right-sided rib pain beginning yesterday. EXAM: PORTABLE CHEST - 1 VIEW COMPARISON:  Two-view chest x-ray 03/20/2015 and CT chest 03/24/2015. FINDINGS: The heart is mildly enlarged. Atherosclerotic calcifications are present at the aortic arch. A large right pleural effusion is present. Bibasilar airspace disease is worse on the right. Right upper lobe airspace disease  is also present. The visualized soft tissues and bony thorax are unremarkable. IMPRESSION: Right greater than left pleural effusions and basilar airspace disease, concerning for pneumonia. No acute osseous abnormality. Electronically Signed   By: San Morelle M.D.   On: 05/07/2015 15:53    ASSESSMENT & PLAN:   # Chronic Myelomonocytic Leukemia- with baseline white count around 20 to 30,000. However at this admission . His white count is up to 40,000; hemoglobin 8.4 [baseline 8-10]; platelets within normal limits.  Recommendations:  # Patient is a poor candidate for any palliative therapies like hypo-methylating agents- given his age/multiple comorbidities. Transfuse PRBC if hemoglobin less than 8. Patient has not needed any platelet transfusions in the past.  # As per your management, I would recommend continued supportive care/antibiotics for his pneumonia/shingles.   Thank you Dr.Vachanni  for allowing me to participate in the care of your pleasant patient. Please do not hesitate to contact me with questions or concerns in the interim.       Cammie Sickle, MD 05/19/2015 12:29 PM

## 2015-05-19 NOTE — Consult Note (Signed)
[facility] Infectious Disease     Reason for Consult:pna, shingles  Referring Physician: Earleen Newport Date of Admission:  05/26/2015   Principal Problem:   Pneumonia Active Problems:   Shingles   Chronic myelomonocytic leukemia, in relapse ([place])   HPI: Lucas Newman is a 82 y.o. male history of chronic myelo monocytic leukemia admitted with cough, sob and rash. Found to have infiltrate and wbc elevated above baseline. Started on IV abx and reports some improvement. Reports cough for several weeks. Also had R chest wall pain and redness.   Past Medical History  Diagnosis Date  . Hyperlipidemia   . GERD (gastroesophageal reflux disease)   . Arthritis   . CVA (cerebral infarction)   . CML (chronic myelocytic leukemia) ([place])    Past Surgical History  Procedure Laterality Date  . Appendectomy    . Replacement total knee bilateral  2002/2004  . Carotid endarterectomy Left    Social History  Substance Use Topics  . Smoking status: Former Smoker -- 0.00 packs/day for 20 years    Quit date: 05/11/1984  . Smokeless tobacco: Never Used  . Alcohol Use: No     Comment: occasional beer/bloody mary   Family History  Problem Relation Age of Onset  . Lung cancer Brother   . Diabetes type II Mother   . Hypertension Father     Allergies: No Known Allergies  Current antibiotics: Antibiotics Given (last 72 hours)    Date/Time Action Medication Dose Rate   05/30/2015 1610 Given   valACYclovir (VALTREX) tablet 1,000 mg 1,000 mg    05/26/2015 2233 Given   cefTRIAXone (ROCEPHIN) 1 g in dextrose 5 % 50 mL IVPB 1 g 100 mL/hr   05/16/2015 2233 Given   azithromycin (ZITHROMAX) 250 mg in dextrose 5 % 125 mL IVPB 250 mg 125 mL/hr   05/27/2015 2256 Given   piperacillin-tazobactam (ZOSYN) IVPB 3.375 g 3.375 g 12.5 mL/hr   05/19/15 0007 Given   valACYclovir (VALTREX) tablet 1,000 mg 1,000 mg    05/19/15 0007 Given   vancomycin (VANCOCIN) IVPB 750 mg/150 ml premix 750 mg 150 mL/hr   05/19/15 0538  Given   piperacillin-tazobactam (ZOSYN) IVPB 3.375 g 3.375 g 12.5 mL/hr   05/19/15 0809 Given   valACYclovir (VALTREX) tablet 1,000 mg 1,000 mg       MEDICATIONS: . azithromycin  250 mg Intravenous Q24H  . calcium-vitamin D  1 tablet Oral Daily  . carvedilol  6.25 mg Oral BID WC  . cefTRIAXone (ROCEPHIN)  IV  1 g Intravenous Q24H  . dipyridamole-aspirin  1 capsule Oral BID  . docusate sodium  100 mg Oral BID  . finasteride  5 mg Oral Daily  . heparin  5,000 Units Subcutaneous 3 times per day  . loratadine  10 mg Oral Daily  . magnesium oxide  400 mg Oral QHS  . multivitamin with minerals  1 tablet Oral Daily  . pantoprazole  40 mg Oral Daily  . predniSONE  2.5 mg Oral Daily  . senna-docusate  1 tablet Oral QHS  . simvastatin  20 mg Oral QHS  . tamsulosin  0.4 mg Oral QHS  . valACYclovir  1,000 mg Oral TID    Review of Systems - 11 systems reviewed and negative per HPI   OBJECTIVE: Temp:  [97.7 F (36.5 C)-99.2 F (37.3 C)] 98 F (36.7 C) (01/13 1315) Pulse Rate:  [44-136] 94 (01/13 1315) Resp:  [18-31] 20 (01/13 1315) BP: (103-136)/(46-79) 108/55 mmHg (01/13 1315) SpO2:  [  88 %-98 %] 93 % (01/13 1315) Weight:  [79.379 kg (175 lb)-83.371 kg (183 lb 12.8 oz)] 83.371 kg (183 lb 12.8 oz) (01/12 2022) Physical Exam  Constitutional: He is oriented to person, place, and time. HOH, frail HENT:  Mouth/Throat: Oropharynx is clear and dry. No oropharyngeal exudate.  Cardiovascular: Normal rate, regular rhythm  Pulmonary/Chest: bibasilar rhonhi poor ar movement.  Abdominal: Soft. Bowel sounds are normal. He exhibits some distension. There is no tenderness.  Lymphadenopathy:  He has no cervical adenopathy.  Neurological: He is alert and oriented to person, place, and time.  Skin: Skin is warm and dry. Mild erythema R flank, vesicle on abd wall  Psychiatric: He has a normal mood and affect. His behavior is normal.   LABS: Results for orders placed or performed during the  hospital encounter of 05/25/2015 (from the past 48 hour(s))  Lactic acid, plasma     Status: None   Collection Time: 06/04/2015  3:35 PM  Result Value Ref Range   Lactic Acid, Venous 1.8 0.5 - 2.0 mmol/L  CBC WITH DIFFERENTIAL     Status: Abnormal   Collection Time: 05/07/2015  3:35 PM  Result Value Ref Range   WBC 43.2 (H) 3.8 - 10.6 K/uL   RBC 3.45 (L) 4.40 - 5.90 MIL/uL   Hemoglobin 10.3 (L) 13.0 - 18.0 g/dL   HCT 32.1 (L) 40.0 - 52.0 %   MCV 92.9 80.0 - 100.0 fL   MCH 29.7 26.0 - 34.0 pg   MCHC 32.0 32.0 - 36.0 g/dL   RDW 16.1 (H) 11.5 - 14.5 %   Platelets 185 150 - 440 K/uL   Neutrophils Relative % 55% %   Neutro Abs 23.9 (H) 1.4 - 6.5 K/uL   Lymphocytes Relative 3% %   Lymphs Abs 1.2 1.0 - 3.6 K/uL   Monocytes Relative 42% %   Monocytes Absolute 18.0 (H) 0.2 - 1.0 K/uL   Eosinophils Relative 0% %   Eosinophils Absolute 0.1 0 - 0.7 K/uL   Basophils Relative 0% %   Basophils Absolute 0.1 0 - 0.1 K/uL  APTT     Status: None   Collection Time: 05/23/2015  3:35 PM  Result Value Ref Range   aPTT 29 24 - 36 seconds  Protime-INR     Status: Abnormal   Collection Time: 06/02/2015  3:35 PM  Result Value Ref Range   Prothrombin Time 15.1 (H) 11.4 - 15.0 seconds   INR 1.17   Blood Culture (routine x 2)     Status: None (Preliminary result)   Collection Time: 05/20/2015  3:35 PM  Result Value Ref Range   Specimen Description BLOOD LEFT WRIST    Special Requests      BOTTLES DRAWN AEROBIC AND ANAEROBIC ANA 5CC AERO [place]   Culture NO GROWTH < 24 HOURS    Report Status PENDING   Blood Culture (routine x 2)     Status: None (Preliminary result)   Collection Time: 05/31/2015  3:36 PM  Result Value Ref Range   Specimen Description BLOOD RIGHT HAND    Special Requests      BOTTLES DRAWN AEROBIC AND ANAEROBIC ANA 2CC AERO 3CC   Culture NO GROWTH < 24 HOURS    Report Status PENDING   Comprehensive metabolic panel     Status: Abnormal   Collection Time: 05/25/2015  5:36 PM  Result Value Ref Range    Sodium 138 135 - 145 mmol/L   Potassium 4.4 3.5 - 5.1 mmol/L  Comment: HEMOLYSIS AT THIS LEVEL MAY AFFECT RESULT   Chloride 106 101 - 111 mmol/L   CO2 24 22 - 32 mmol/L   Glucose, Bld 137 (H) 65 - 99 mg/dL   BUN 31 (H) 6 - 20 mg/dL   Creatinine, Ser 1.77 (H) 0.61 - 1.24 mg/dL   Calcium 7.8 (L) 8.9 - 10.3 mg/dL   Total Protein 6.0 (L) 6.5 - 8.1 g/dL   Albumin 2.4 (L) 3.5 - 5.0 g/dL   AST 22 15 - 41 U/L    Comment: HEMOLYSIS AT THIS LEVEL MAY AFFECT RESULT   ALT 12 (L) 17 - 63 U/L    Comment: HEMOLYSIS AT THIS LEVEL MAY AFFECT RESULT   Alkaline Phosphatase 58 38 - 126 U/L   Total Bilirubin 1.1 0.3 - 1.2 mg/dL    Comment: HEMOLYSIS AT THIS LEVEL MAY AFFECT RESULT   GFR calc non Af Amer 31 (L) >60 mL/min   GFR calc Af Amer 36 (L) >60 mL/min    Comment: (NOTE) The eGFR has been calculated using the CKD EPI equation. This calculation has not been validated in all clinical situations. eGFR's persistently <60 mL/min signify possible Chronic Kidney Disease.    Anion gap 8 5 - 15  Lipase, blood     Status: None   Collection Time: 05/11/2015  5:36 PM  Result Value Ref Range   Lipase 18 11 - 51 U/L  Troponin I     Status: None   Collection Time: 05/13/2015  5:36 PM  Result Value Ref Range   Troponin I <0.03 <0.031 ng/mL    Comment:        NO INDICATION OF MYOCARDIAL INJURY.   Lactic acid, plasma     Status: None   Collection Time: 05/25/2015  6:56 PM  Result Value Ref Range   Lactic Acid, Venous 0.9 0.5 - 2.0 mmol/L  Urinalysis complete, with microscopic ([facility] only)     Status: Abnormal   Collection Time: 05/14/2015  8:52 PM  Result Value Ref Range   Color, Urine YELLOW (A) YELLOW   APPearance CLOUDY (A) CLEAR   Glucose, UA NEGATIVE NEGATIVE mg/dL   Bilirubin Urine NEGATIVE NEGATIVE   Ketones, ur NEGATIVE NEGATIVE mg/dL   Specific Gravity, Urine 1.018 1.005 - 1.030   Hgb urine dipstick NEGATIVE NEGATIVE   pH 5.0 5.0 - 8.0   Protein, ur 30 (A) NEGATIVE mg/dL   Nitrite NEGATIVE  NEGATIVE   Leukocytes, UA NEGATIVE NEGATIVE   RBC / HPF 0-5 0 - 5 RBC/hpf   WBC, UA 6-30 0 - 5 WBC/hpf   Bacteria, UA NONE SEEN NONE SEEN   Squamous Epithelial / LPF NONE SEEN NONE SEEN   Mucous PRESENT   Urine culture     Status: None (Preliminary result)   Collection Time: 06/04/2015  8:52 PM  Result Value Ref Range   Specimen Description URINE, RANDOM    Special Requests NONE    Culture NO GROWTH < 24 HOURS    Report Status PENDING   Basic metabolic panel     Status: Abnormal   Collection Time: 05/19/15  6:33 AM  Result Value Ref Range   Sodium 134 (L) 135 - 145 mmol/L   Potassium 3.7 3.5 - 5.1 mmol/L   Chloride 104 101 - 111 mmol/L   CO2 22 22 - 32 mmol/L   Glucose, Bld 144 (H) 65 - 99 mg/dL   BUN 30 (H) 6 - 20 mg/dL   Creatinine, Ser 1.79 (H) 0.61 -  1.24 mg/dL   Calcium 7.4 (L) 8.9 - 10.3 mg/dL   GFR calc non Af Amer 31 (L) >60 mL/min   GFR calc Af Amer 36 (L) >60 mL/min    Comment: (NOTE) The eGFR has been calculated using the CKD EPI equation. This calculation has not been validated in all clinical situations. eGFR's persistently <60 mL/min signify possible Chronic Kidney Disease.    Anion gap 8 5 - 15  CBC     Status: Abnormal   Collection Time: 05/19/15  6:33 AM  Result Value Ref Range   WBC 43.1 (H) 3.8 - 10.6 K/uL   RBC 2.91 (L) 4.40 - 5.90 MIL/uL   Hemoglobin 8.4 (L) 13.0 - 18.0 g/dL   HCT 26.8 (L) 40.0 - 52.0 %   MCV 92.3 80.0 - 100.0 fL   MCH 28.7 26.0 - 34.0 pg   MCHC 31.1 (L) 32.0 - 36.0 g/dL   RDW 16.0 (H) 11.5 - 14.5 %   Platelets 165 150 - 440 K/uL   No components found for: ESR, C REACTIVE PROTEIN MICRO: Recent Results (from the past 720 hour(s))  Blood Culture (routine x 2)     Status: None (Preliminary result)   Collection Time: 06/01/2015  3:35 PM  Result Value Ref Range Status   Specimen Description BLOOD LEFT WRIST  Final   Special Requests   Final    BOTTLES DRAWN AEROBIC AND ANAEROBIC ANA 5CC AERO [place]   Culture NO GROWTH < 24 HOURS  Final    Report Status PENDING  Incomplete  Blood Culture (routine x 2)     Status: None (Preliminary result)   Collection Time: 06/04/2015  3:36 PM  Result Value Ref Range Status   Specimen Description BLOOD RIGHT HAND  Final   Special Requests   Final    BOTTLES DRAWN AEROBIC AND ANAEROBIC ANA 2CC AERO 3CC   Culture NO GROWTH < 24 HOURS  Final   Report Status PENDING  Incomplete  Urine culture     Status: None (Preliminary result)   Collection Time: 05/16/2015  8:52 PM  Result Value Ref Range Status   Specimen Description URINE, RANDOM  Final   Special Requests NONE  Final   Culture NO GROWTH < 24 HOURS  Final   Report Status PENDING  Incomplete    IMAGING: Dg Chest Port 1 View  05/11/2015  CLINICAL DATA:  Right-sided rib pain beginning yesterday. EXAM: PORTABLE CHEST - 1 VIEW COMPARISON:  Two-view chest x-ray 03/20/2015 and CT chest 03/24/2015. FINDINGS: The heart is mildly enlarged. Atherosclerotic calcifications are present at the aortic arch. A large right pleural effusion is present. Bibasilar airspace disease is worse on the right. Right upper lobe airspace disease is also present. The visualized soft tissues and bony thorax are unremarkable. IMPRESSION: Right greater than left pleural effusions and basilar airspace disease, concerning for pneumonia. No acute osseous abnormality. Electronically Signed   By: San Morelle M.D.   On: 05/15/2015 15:53    Assessment:   REEVES MUSICK is a 82 y.o. male with CML admitted with PNA and rash likely shingles  Recommendations PNA - check sputum cx Cont ctx and azithro dced vanco zosyn  Shingles - cont valtrex Thank you very much for allowing me to participate in the care of this patient. Please call with questions.   Cheral Marker. Ola Spurr, MD

## 2015-05-19 NOTE — Progress Notes (Signed)
ANTIBIOTIC CONSULT NOTE - INITIAL  Pharmacy Consult for Vancomycin/Zosyn  Indication: rule out sepsis  No Known Allergies  Patient Measurements: Height: 5\' 6"  (167.6 cm) Weight: 183 lb 12.8 oz (83.371 kg) IBW/kg (Calculated) : 63.8 Adjusted Body Weight:   Vital Signs: Temp: 98 F (36.7 C) (01/13 1315) Temp Source: Oral (01/13 1315) BP: 108/55 mmHg (01/13 1315) Pulse Rate: 94 (01/13 1315) Intake/Output from previous day: 01/12 0701 - 01/13 0700 In: -  Out: 500 [Urine:500] Intake/Output from this shift:    Labs:  Recent Labs  05/19/2015 1535 05/15/2015 1736 05/19/15 0633  WBC 43.2*  --  43.1*  HGB 10.3*  --  8.4*  PLT 185  --  165  CREATININE  --  1.77* 1.79*   Estimated Creatinine Clearance: 26.1 mL/min (by C-G formula based on Cr of 1.79). No results for input(s): VANCOTROUGH, VANCOPEAK, VANCORANDOM, GENTTROUGH, GENTPEAK, GENTRANDOM, TOBRATROUGH, TOBRAPEAK, TOBRARND, AMIKACINPEAK, AMIKACINTROU, AMIKACIN in the last 72 hours.   Microbiology: Recent Results (from the past 720 hour(s))  Blood Culture (routine x 2)     Status: None (Preliminary result)   Collection Time: 05/16/2015  3:35 PM  Result Value Ref Range Status   Specimen Description BLOOD LEFT WRIST  Final   Special Requests   Final    BOTTLES DRAWN AEROBIC AND ANAEROBIC ANA 5CC AERO [place]   Culture NO GROWTH < 24 HOURS  Final   Report Status PENDING  Incomplete  Blood Culture (routine x 2)     Status: None (Preliminary result)   Collection Time: 05/28/2015  3:36 PM  Result Value Ref Range Status   Specimen Description BLOOD RIGHT HAND  Final   Special Requests   Final    BOTTLES DRAWN AEROBIC AND ANAEROBIC ANA 2CC AERO 3CC   Culture NO GROWTH < 24 HOURS  Final   Report Status PENDING  Incomplete  Urine culture     Status: None (Preliminary result)   Collection Time: 05/26/2015  8:52 PM  Result Value Ref Range Status   Specimen Description URINE, RANDOM  Final   Special Requests NONE  Final   Culture NO  GROWTH < 24 HOURS  Final   Report Status PENDING  Incomplete    Medical History: Past Medical History  Diagnosis Date  . Hyperlipidemia   . GERD (gastroesophageal reflux disease)   . Arthritis   . CVA (cerebral infarction)   . CML (chronic myelocytic leukemia) (HCC)     Medications:  Prescriptions prior to admission  Medication Sig Dispense Refill Last Dose  . acetaminophen (TYLENOL) 650 MG CR tablet Take 650 mg by mouth every 4 (four) hours as needed for pain.    PRN  . Calcium Carb-Cholecalciferol (CALCIUM 600+D) 600-800 MG-UNIT TABS Take 1 tablet by mouth daily.   05/26/2015 at Unknown time  . carvedilol (COREG) 6.25 MG tablet Take 1 tablet (6.25 mg total) by mouth 2 (two) times daily with a meal. 60 tablet 0 UNKNOWN  . cyclobenzaprine (FLEXERIL) 5 MG tablet Take 1 tablet (5 mg total) by mouth 3 (three) times daily as needed for muscle spasms. 30 tablet 0 PRN  . dipyridamole-aspirin (AGGRENOX) 200-25 MG per 12 hr capsule Take 1 capsule by mouth 2 (two) times daily.    UNKNOWN  . docusate sodium (COLACE) 100 MG capsule Take 1 capsule (100 mg total) by mouth 2 (two) times daily. 10 capsule 0 UNKNOWN  . finasteride (PROSCAR) 5 MG tablet Take 5 mg by mouth daily.   05/13/2015 at Unknown time  .  fluticasone (FLONASE) 50 MCG/ACT nasal spray Place 1 spray into both nostrils 2 (two) times daily as needed for rhinitis.   PRN  . HYDROcodone-acetaminophen (NORCO/VICODIN) 5-325 MG tablet Take 1 tablet by mouth every 4 (four) hours as needed for moderate pain. 30 tablet 0 PRN  . loratadine (CLARITIN) 10 MG tablet Take 10 mg by mouth daily.   05/25/2015 at Unknown time  . Magnesium 250 MG TABS Take 250 mg by mouth at bedtime.    06/03/2015 at Unknown time  . Multiple Vitamin (MULTIVITAMIN WITH MINERALS) TABS tablet Take 1 tablet by mouth daily.   05/28/2015 at Unknown time  . omeprazole (PRILOSEC) 20 MG capsule Take 20 mg by mouth 2 (two) times daily.    05/12/2015 at Unknown time  . predniSONE  (DELTASONE) 5 MG tablet Take 2.5 mg by mouth daily.    06/06/2015 at Unknown time  . senna-docusate (SENOKOT-S) 8.6-50 MG tablet Take 1 tablet by mouth at bedtime.   05/09/2015 at Unknown time  . simvastatin (ZOCOR) 20 MG tablet Take 20 mg by mouth at bedtime.    05/31/2015 at Unknown time  . tamsulosin (FLOMAX) 0.4 MG CAPS capsule Take 0.4 mg by mouth at bedtime.    06/02/2015 at Unknown time  . chlorpheniramine-HYDROcodone (TUSSIONEX) 10-8 MG/5ML SUER Take 5 mLs by mouth every 12 (twelve) hours as needed for cough. 140 mL 0 Taking  . guaiFENesin (ROBITUSSIN) 100 MG/5ML SOLN Take 10 mLs (200 mg total) by mouth every 4 (four) hours as needed for cough or to loosen phlegm. 1200 mL 0 Taking   Assessment: CrCl = 26.1 ml/min   Goal of Therapy:  Vancomycin trough level 15-20 mcg/ml  Plan:  Expected duration 7 days with resolution of temperature and/or normalization of WBC   Pt was treated on 1/12 with Vancomycin and Zosyn and d/c'd on 1/13 AM but returns to [facility] with same complaint.  Will resume previous Vanc and Zosyn orders.   Zosyn 3.375 gm IV Q8H EI ordered to resume 1/13. Vancomycin 750 mg IV Q24H to start 1/14 @ 00:00. Will draw 1st Vanc trough on 1/16 @ 23:30.   Deyvi Bonanno D 05/19/2015,7:24 PM

## 2015-05-20 ENCOUNTER — Inpatient Hospital Stay: Payer: Medicare Other

## 2015-05-20 LAB — CBC
HCT: 30.1 % — ABNORMAL LOW (ref 40.0–52.0)
HEMOGLOBIN: 9.3 g/dL — AB (ref 13.0–18.0)
MCH: 28.7 pg (ref 26.0–34.0)
MCHC: 30.9 g/dL — AB (ref 32.0–36.0)
MCV: 92.8 fL (ref 80.0–100.0)
Platelets: 196 10*3/uL (ref 150–440)
RBC: 3.24 MIL/uL — ABNORMAL LOW (ref 4.40–5.90)
RDW: 16.3 % — ABNORMAL HIGH (ref 11.5–14.5)
WBC: 47.9 10*3/uL — ABNORMAL HIGH (ref 3.8–10.6)

## 2015-05-20 LAB — CREATININE, SERUM
Creatinine, Ser: 1.8 mg/dL — ABNORMAL HIGH (ref 0.61–1.24)
GFR calc Af Amer: 36 mL/min — ABNORMAL LOW (ref >60)
GFR, EST NON AFRICAN AMERICAN: 31 mL/min — AB (ref >60)

## 2015-05-20 LAB — URINE CULTURE

## 2015-05-20 MED ORDER — VITAMIN B-1 100 MG PO TABS
100.0000 mg | ORAL_TABLET | Freq: Every day | ORAL | Status: DC
Start: 2015-05-21 — End: 2015-05-29
  Administered 2015-05-23 – 2015-05-28 (×6): 100 mg via ORAL
  Filled 2015-05-20 (×7): qty 1

## 2015-05-20 MED ORDER — BUDESONIDE 0.25 MG/2ML IN SUSP
0.2500 mg | Freq: Two times a day (BID) | RESPIRATORY_TRACT | Status: DC
  Administered 2015-05-20 – 2015-05-28 (×17): 0.25 mg via RESPIRATORY_TRACT
  Filled 2015-05-20 (×17): qty 2

## 2015-05-20 MED ORDER — ADULT MULTIVITAMIN W/MINERALS CH: 1.0000 | ORAL_TABLET | Freq: Every day | ORAL | Status: DC

## 2015-05-20 MED ORDER — FOLIC ACID 1 MG PO TABS
1.0000 mg | ORAL_TABLET | Freq: Every day | ORAL | Status: DC
  Filled 2015-05-20: qty 1

## 2015-05-20 MED ORDER — VANCOMYCIN HCL IN DEXTROSE 750-5 MG/150ML-% IV SOLN
750.0000 mg | INTRAVENOUS | Status: DC
  Administered 2015-05-20 – 2015-05-23 (×4): 750 mg via INTRAVENOUS
  Filled 2015-05-20 (×4): qty 150

## 2015-05-20 MED ORDER — PIPERACILLIN-TAZOBACTAM 3.375 G IVPB
3.3750 g | Freq: Three times a day (TID) | INTRAVENOUS | Status: DC
  Administered 2015-05-20 – 2015-05-27 (×22): 3.375 g via INTRAVENOUS
  Filled 2015-05-20 (×24): qty 50

## 2015-05-20 MED ORDER — HALOPERIDOL 1 MG PO TABS
0.5000 mg | ORAL_TABLET | Freq: Four times a day (QID) | ORAL | Status: DC | PRN
  Administered 2015-05-20 – 2015-05-29 (×10): 0.5 mg via ORAL
  Filled 2015-05-20 (×11): qty 1

## 2015-05-20 MED ORDER — IPRATROPIUM-ALBUTEROL 0.5-2.5 (3) MG/3ML IN SOLN
3.0000 mL | Freq: Four times a day (QID) | RESPIRATORY_TRACT | Status: DC
  Administered 2015-05-20 – 2015-05-21 (×5): 3 mL via RESPIRATORY_TRACT
  Filled 2015-05-20 (×5): qty 3

## 2015-05-20 MED ORDER — LORAZEPAM 2 MG/ML IJ SOLN
2.0000 mg | INTRAMUSCULAR | Status: DC | PRN
  Administered 2015-05-20: 23:00:00 2 mg via INTRAVENOUS
  Administered 2015-05-21: 08:00:00 3 mg via INTRAVENOUS
  Filled 2015-05-20 (×2): qty 2

## 2015-05-20 MED ORDER — METHYLPREDNISOLONE SODIUM SUCC 40 MG IJ SOLR
40.0000 mg | Freq: Four times a day (QID) | INTRAMUSCULAR | Status: DC
  Administered 2015-05-20 – 2015-05-21 (×5): 40 mg via INTRAVENOUS
  Filled 2015-05-20 (×5): qty 1

## 2015-05-20 NOTE — Plan of Care (Signed)
Problem: Education: Goal: Knowledge of [place] [facility] Education information/materials will improve Outcome: Progressing Patient with likely admission for shingles.  Patient educated this shift about the need for airborne precautions.  Patient education provided about not getting out of bed without asking for help.  Patient non-compliant with calling for help.     Problem: Safety: Goal: Ability to remain free from injury will improve Outcome: Progressing Patient is on high fall risk with high fall protocol with bed alarm on throughout shift. Patient not compliant with calling for assistance to get out of bed to use the bathroom. Bed alarm and chair alarm answered multiple times this shift.  Continued education given about not getting out of bed without assistance.  Problem: Pain Managment: Goal: General experience of comfort will improve Outcome: Progressing Patient without complaints of pain this shift.

## 2015-05-20 NOTE — Progress Notes (Signed)
Patient ID: Lucas Newman, male   DOB: Oct 12, 1919, 82 y.o.   MRN: [id number] [facility] PROGRESS NOTE  Lucas Newman [id number] DOB: [birth year] DOA: 05/14/2015 PCP: Lavera Guise, MD  HPI/Subjective: Patient feeling short of breath and some cough, audible wheeze heard.  Patient states he does not want to be resussitated and as per nurse the daughter to bring in dnr form.  Patient states he does not want to be a burden to anyone.  I think he maybe giving up. Left message for daughter.  Objective: Filed Vitals:   05/20/15 0530 05/20/15 0830  BP: 88/64 121/70  Pulse: 113   Temp: 97.7 F (36.5 C)   Resp: 20     Filed Weights   05/17/2015 1521 05/31/2015 2022  Weight: 79.379 kg (175 lb) 83.371 kg (183 lb 12.8 oz)    ROS: Review of Systems  Constitutional: Negative for fever and chills.  Eyes: Negative for blurred vision.  Respiratory: Positive for cough and shortness of breath.   Cardiovascular: Negative for chest pain.  Gastrointestinal: Negative for nausea, vomiting, abdominal pain, diarrhea and constipation.  Genitourinary: Negative for dysuria.  Musculoskeletal: Negative for joint pain.  Neurological: Negative for dizziness and headaches.   Exam: Physical Exam  Constitutional: He is oriented to person, place, and time.  HENT:  Nose: No mucosal edema.  Mouth/Throat: No oropharyngeal exudate or posterior oropharyngeal edema.  Eyes: Conjunctivae, EOM and lids are normal. Pupils are equal, round, and reactive to light.  Neck: No JVD present. Carotid bruit is not present. No edema present. No thyroid mass and no thyromegaly present.  Cardiovascular: S1 normal and S2 normal.  Exam reveals no gallop.   No murmur heard. Pulses:      Dorsalis pedis pulses are 2+ on the right side, and 2+ on the left side.  Respiratory: No respiratory distress. He has no wheezes. He has rhonchi in the right lower field and the left lower field. He has no rales.  GI: Soft.  Bowel sounds are normal. There is no tenderness.  Musculoskeletal:       Right ankle: He exhibits no swelling.       Left ankle: He exhibits no swelling.  Lymphadenopathy:    He has no cervical adenopathy.  Neurological: He is alert and oriented to person, place, and time. No cranial nerve deficit.  Skin: Skin is warm. Nails show no clubbing.  Bruising seen on arms  Psychiatric: He has a normal mood and affect.    Data Reviewed: Basic Metabolic Panel:  Recent Labs Lab 06/04/2015 1736 05/19/15 0633 05/20/15 0625  NA 138 134*  --   K 4.4 3.7  --   CL 106 104  --   CO2 24 22  --   GLUCOSE 137* 144*  --   BUN 31* 30*  --   CREATININE 1.77* 1.79* 1.80*  CALCIUM 7.8* 7.4*  --    Liver Function Tests:  Recent Labs Lab 05/30/2015 1736  AST 22  ALT 12*  ALKPHOS 58  BILITOT 1.1  PROT 6.0*  ALBUMIN 2.4*    Recent Labs Lab 05/09/2015 1736  LIPASE 18   CBC:  Recent Labs Lab 05/12/2015 1535 05/19/15 0633 05/20/15 0625  WBC 43.2* 43.1* 47.9*  NEUTROABS 23.9*  --   --   HGB 10.3* 8.4* 9.3*  HCT 32.1* 26.8* 30.1*  MCV 92.9 92.3 92.8  PLT 185 165 196   Cardiac Enzymes:  Recent Labs Lab 05/21/2015 1736  TROPONINI <0.03  BNP (last 3 results)  Recent Labs  09/04/14 1200 03/20/15 1553  BNP 172.0* 125.0*      Recent Results (from the past 240 hour(s))  Blood Culture (routine x 2)     Status: None (Preliminary result)   Collection Time: 05/07/2015  3:35 PM  Result Value Ref Range Status   Specimen Description BLOOD LEFT WRIST  Final   Special Requests   Final    BOTTLES DRAWN AEROBIC AND ANAEROBIC ANA 5CC AERO [place]   Culture NO GROWTH < 24 HOURS  Final   Report Status PENDING  Incomplete  Blood Culture (routine x 2)     Status: None (Preliminary result)   Collection Time: 05/24/2015  3:36 PM  Result Value Ref Range Status   Specimen Description BLOOD RIGHT HAND  Final   Special Requests   Final    BOTTLES DRAWN AEROBIC AND ANAEROBIC ANA 2CC AERO 3CC   Culture  NO GROWTH < 24 HOURS  Final   Report Status PENDING  Incomplete  Urine culture     Status: None (Preliminary result)   Collection Time: 05/23/2015  8:52 PM  Result Value Ref Range Status   Specimen Description URINE, RANDOM  Final   Special Requests NONE  Final   Culture NO GROWTH < 24 HOURS  Final   Report Status PENDING  Incomplete     Studies: Dg Chest Port 1 View  05/20/2015  CLINICAL DATA:  Worsening shortness of breath this morning. EXAM: PORTABLE CHEST 1 VIEW COMPARISON:  05/22/2015 FINDINGS: A pleural fluid on the right has increased. Is now appears loculated along the peripheral right mid lung. The right hemidiaphragm is obscured. Hazy opacity at the left lung base is likely due to a combination small effusion and atelectasis No consents and pulmonary edema. No pneumothorax.  Cardiac silhouette normal in size.  The IMPRESSION: 1. Worsening lung aeration on the right. This may be due to increased pleural fluid, worsened lung consolidation or a combination. Appearance of the left lung is stable with evidence of a small effusion and mild basilar atelectasis. No convincing pulmonary edema. Electronically Signed   By: Lajean Manes M.D.   On: 05/20/2015 07:27   Dg Chest Port 1 View  05/21/2015  CLINICAL DATA:  Right-sided rib pain beginning yesterday. EXAM: PORTABLE CHEST - 1 VIEW COMPARISON:  Two-view chest x-ray 03/20/2015 and CT chest 03/24/2015. FINDINGS: The heart is mildly enlarged. Atherosclerotic calcifications are present at the aortic arch. A large right pleural effusion is present. Bibasilar airspace disease is worse on the right. Right upper lobe airspace disease is also present. The visualized soft tissues and bony thorax are unremarkable. IMPRESSION: Right greater than left pleural effusions and basilar airspace disease, concerning for pneumonia. No acute osseous abnormality. Electronically Signed   By: San Morelle M.D.   On: 05/21/2015 15:53    Scheduled Meds: .  azithromycin  250 mg Intravenous Q24H  . budesonide (PULMICORT) nebulizer solution  0.25 mg Nebulization BID  . calcium-vitamin D  1 tablet Oral Daily  . carvedilol  6.25 mg Oral BID WC  . dipyridamole-aspirin  1 capsule Oral BID  . docusate sodium  100 mg Oral BID  . enoxaparin (LOVENOX) injection  30 mg Subcutaneous Q24H  . finasteride  5 mg Oral Daily  . ipratropium-albuterol  3 mL Nebulization Q6H  . loratadine  10 mg Oral Daily  . magnesium oxide  400 mg Oral QHS  . methylPREDNISolone (SOLU-MEDROL) injection  40 mg Intravenous Q6H  .  multivitamin with minerals  1 tablet Oral Daily  . pantoprazole  40 mg Oral Daily  . senna-docusate  1 tablet Oral QHS  . simvastatin  20 mg Oral QHS  . tamsulosin  0.4 mg Oral QHS  . valACYclovir  1,000 mg Oral Daily   Assessment/Plan:  1. Acute respiratory failure with hypoxia. Oxygen supplementation 2. Clinical sepsis present on admission with pneumonia, tachycardia and leukocytosis. Vancomycin and Zosyn and Zithromax. Spoke with Pharmacy to dose abx. Pulmonary consult with pleural effusion on right. 3. Atrial fibrillation with rapid ventricular response on Coreg 4. CML- white count always high but higher now. 5. Hyperlipidemia unspecified on simvastatin 6. BPH on Flomax and finasteride 7. Gastroesophageal reflux disease on Protonix 8. Shingles- pharmacy to renally dose Valtrex 9. Chronic kidney disease stage III  Code Status: Patient just made DNR    Disposition Plan: To be determined, Left message for daughter  Antibiotics:  Vancomycin  Zosyn  Zithromax  Time spent: 25 minutes  Loletha Grayer  [facility]

## 2015-05-20 NOTE — Plan of Care (Signed)
Pt is much more confused today - has to be in negative air pressure room b/c of shingles.  Sitter has been ordered b/c he's impulsive and Haldol is available PRN.  Pt agitated b/c he feels like he's not in control.  Doesn't want to be a burden to others.  Frustrated that he hasn't seen his wife in several days.   Pt had stat chest xray and chest CT - shows consolidation RLL.  Now on IV abx, breathing tx, IV solumedrol.  Had Pulmonary consult completed.  Had swallow eval today b/c of pt coughing while he eats. Pt now DNR status.

## 2015-05-20 NOTE — Consult Note (Signed)
Pulmonary Critical Care  Initial Consult Note   TAVON GONGWER [id number] DOB: June 06, 1919 DOA: 05/27/2015  Referring physician: Arnetha Gula PCP: Lavera Guise, MD   Chief Complaint: Shortness of breath  HPI: GRAE NISKA is a 82 y.o. male with prior history of HLD Arthritis stroke CML presented with weakness and shortness of breath. Patient been having shortness of breath going on for last couple days.  Also patient is complaining having right lower chest pain.  Chest x-ray that was done showed pleural effusion on the right side.  Patient was also noted to have what appears to be shingles.  Patient was started on Valtrex.  Last chest x-ray November did not show the effusion.    Review of Systems:  ROS performed and is unremarkable other than noted in HPI  Past Medical History  Diagnosis Date  . Hyperlipidemia   . GERD (gastroesophageal reflux disease)   . Arthritis   . CVA (cerebral infarction)   . CML (chronic myelocytic leukemia) ([place])    Past Surgical History  Procedure Laterality Date  . Appendectomy    . Replacement total knee bilateral  2002/2004  . Carotid endarterectomy Left    Social History:  reports that he quit smoking about 31 years ago. He has never used smokeless tobacco. He reports that he does not drink alcohol or use illicit drugs.  No Known Allergies  Family History  Problem Relation Age of Onset  . Lung cancer Brother   . Diabetes type II Mother   . Hypertension Father     Prior to Admission medications   Medication Sig Start Date End Date Taking? Authorizing Provider  acetaminophen (TYLENOL) 650 MG CR tablet Take 650 mg by mouth every 4 (four) hours as needed for pain.    Yes Historical Provider, MD  Calcium Carb-Cholecalciferol (CALCIUM 600+D) 600-800 MG-UNIT TABS Take 1 tablet by mouth daily.   Yes Historical Provider, MD  carvedilol (COREG) 6.25 MG tablet Take 1 tablet (6.25 mg total) by mouth 2 (two) times daily with a meal.  03/28/15  Yes Aldean Jewett, MD  cyclobenzaprine (FLEXERIL) 5 MG tablet Take 1 tablet (5 mg total) by mouth 3 (three) times daily as needed for muscle spasms. 03/28/15  Yes Aldean Jewett, MD  dipyridamole-aspirin (AGGRENOX) 200-25 MG per 12 hr capsule Take 1 capsule by mouth 2 (two) times daily.    Yes Historical Provider, MD  docusate sodium (COLACE) 100 MG capsule Take 1 capsule (100 mg total) by mouth 2 (two) times daily. 03/28/15  Yes Aldean Jewett, MD  finasteride (PROSCAR) 5 MG tablet Take 5 mg by mouth daily.   Yes Historical Provider, MD  fluticasone (FLONASE) 50 MCG/ACT nasal spray Place 1 spray into both nostrils 2 (two) times daily as needed for rhinitis.   Yes Historical Provider, MD  HYDROcodone-acetaminophen (NORCO/VICODIN) 5-325 MG tablet Take 1 tablet by mouth every 4 (four) hours as needed for moderate pain. 03/28/15  Yes Aldean Jewett, MD  loratadine (CLARITIN) 10 MG tablet Take 10 mg by mouth daily.   Yes Historical Provider, MD  Magnesium 250 MG TABS Take 250 mg by mouth at bedtime.    Yes Historical Provider, MD  Multiple Vitamin (MULTIVITAMIN WITH MINERALS) TABS tablet Take 1 tablet by mouth daily.   Yes Historical Provider, MD  omeprazole (PRILOSEC) 20 MG capsule Take 20 mg by mouth 2 (two) times daily.    Yes Historical Provider, MD  predniSONE (DELTASONE) 5 MG tablet Take 2.5  mg by mouth daily.    Yes Historical Provider, MD  senna-docusate (SENOKOT-S) 8.6-50 MG tablet Take 1 tablet by mouth at bedtime.   Yes Historical Provider, MD  simvastatin (ZOCOR) 20 MG tablet Take 20 mg by mouth at bedtime.    Yes Historical Provider, MD  tamsulosin (FLOMAX) 0.4 MG CAPS capsule Take 0.4 mg by mouth at bedtime.    Yes Historical Provider, MD  chlorpheniramine-HYDROcodone (TUSSIONEX) 10-8 MG/5ML SUER Take 5 mLs by mouth every 12 (twelve) hours as needed for cough. 03/28/15   Aldean Jewett, MD  guaiFENesin (ROBITUSSIN) 100 MG/5ML SOLN Take 10 mLs (200 mg total) by  mouth every 4 (four) hours as needed for cough or to loosen phlegm. 03/28/15   Aldean Jewett, MD   Physical Exam: Filed Vitals:   05/20/15 0830 05/20/15 1116 05/20/15 1248 05/20/15 1249  BP: 121/70  97/65 95/56  Pulse:   100 99  Temp:   97.6 F (36.4 C)   TempSrc:      Resp:   20   Height:      Weight:      SpO2:  91% 93% 93%    Wt Readings from Last 3 Encounters:  05/08/2015 83.371 kg (183 lb 12.8 oz)  04/26/15 83.008 kg (183 lb)  03/28/15 83.28 kg (183 lb 9.6 oz)    General:  Appears calm and comfortable Eyes: PERRL, normal lids, irises & conjunctiva ENT: grossly normal hearing, lips & tongue Neck: no LAD, masses or thyromegaly Cardiovascular: RRR, no m/r/g. No LE edema. Respiratory:   Patient has normal respiratory effort.  Diminished breath sounds on the right side.  There were no rhonchi or rales noted.. Abdomen: soft, nontender Skin: no rash or induration seen on limited exam Musculoskeletal: grossly normal tone BUE/BLE Psychiatric: grossly normal mood and affect Neurologic: grossly non-focal.          Labs on Admission:  Basic Metabolic Panel:  Recent Labs Lab 05/12/2015 1736 05/19/15 0633 05/20/15 0625  NA 138 134*  --   K 4.4 3.7  --   CL 106 104  --   CO2 24 22  --   GLUCOSE 137* 144*  --   BUN 31* 30*  --   CREATININE 1.77* 1.79* 1.80*  CALCIUM 7.8* 7.4*  --    Liver Function Tests:  Recent Labs Lab 05/17/2015 1736  AST 22  ALT 12*  ALKPHOS 58  BILITOT 1.1  PROT 6.0*  ALBUMIN 2.4*    Recent Labs Lab 05/24/2015 1736  LIPASE 18   No results for input(s): AMMONIA in the last 168 hours. CBC:  Recent Labs Lab 05/11/2015 1535 05/19/15 0633 05/20/15 0625  WBC 43.2* 43.1* 47.9*  NEUTROABS 23.9*  --   --   HGB 10.3* 8.4* 9.3*  HCT 32.1* 26.8* 30.1*  MCV 92.9 92.3 92.8  PLT 185 165 196   Cardiac Enzymes:  Recent Labs Lab 05/31/2015 1736  TROPONINI <0.03    BNP (last 3 results)  Recent Labs  09/04/14 1200 03/20/15 1553  BNP  172.0* 125.0*    ProBNP (last 3 results) No results for input(s): PROBNP in the last 8760 hours.  CBG: No results for input(s): GLUCAP in the last 168 hours.  Radiological Exams on Admission: Dg Chest Port 1 View  05/20/2015  CLINICAL DATA:  Worsening shortness of breath this morning. EXAM: PORTABLE CHEST 1 VIEW COMPARISON:  05/09/2015 FINDINGS: A pleural fluid on the right has increased. Is now appears loculated along the peripheral  right mid lung. The right hemidiaphragm is obscured. Hazy opacity at the left lung base is likely due to a combination small effusion and atelectasis No consents and pulmonary edema. No pneumothorax.  Cardiac silhouette normal in size.  The IMPRESSION: 1. Worsening lung aeration on the right. This may be due to increased pleural fluid, worsened lung consolidation or a combination. Appearance of the left lung is stable with evidence of a small effusion and mild basilar atelectasis. No convincing pulmonary edema. Electronically Signed   By: Lajean Manes M.D.   On: 05/20/2015 07:27   Dg Chest Port 1 View  05/13/2015  CLINICAL DATA:  Right-sided rib pain beginning yesterday. EXAM: PORTABLE CHEST - 1 VIEW COMPARISON:  Two-view chest x-ray 03/20/2015 and CT chest 03/24/2015. FINDINGS: The heart is mildly enlarged. Atherosclerotic calcifications are present at the aortic arch. A large right pleural effusion is present. Bibasilar airspace disease is worse on the right. Right upper lobe airspace disease is also present. The visualized soft tissues and bony thorax are unremarkable. IMPRESSION: Right greater than left pleural effusions and basilar airspace disease, concerning for pneumonia. No acute osseous abnormality. Electronically Signed   By: San Morelle M.D.   On: 05/26/2015 15:53      EKG: Independently reviewed.  Assessment/Plan Principal Problem:   Pneumonia Active Problems:   Shingles   Chronic myelomonocytic leukemia, in relapse ([place])   1. Acute  respiratory Failure with hypoxia -oxygen as needed -ABG as needed  2. Pleural Effusion -not present in november 2016 but was noted to be present earlier this month -will get a CT scan to assess -will likely need a thoracentesis  3. Pneumonia -continue with current antibiotics therapy -needs follow up CXR to clearing  Code Status: DNR   Disposition Plan: TBD      I have personally obtained a history, examined the patient, evaluated laboratory and imaging results, formulated the assessment and plan and placed orders.  The Patient requires high complexity decision making for assessment and support.    Allyne Gee, MD [facility] Pulmonary Critical Care Medicine Sleep Medicine

## 2015-05-20 NOTE — Evaluation (Signed)
Clinical/Bedside Swallow Evaluation Patient Details  Name: Lucas Newman MRN: [id number] Date of Birth: 02/23/1920  Today's Date: 05/20/2015 Time: SLP Start Time (ACUTE ONLY): 32 SLP Stop Time (ACUTE ONLY): 1320 SLP Time Calculation (min) (ACUTE ONLY): 60 min  Past Medical History:  Past Medical History  Diagnosis Date  . Hyperlipidemia   . GERD (gastroesophageal reflux disease)   . Arthritis   . CVA (cerebral infarction)   . CML (chronic myelocytic leukemia) ([place])    Past Surgical History:  Past Surgical History  Procedure Laterality Date  . Appendectomy    . Replacement total knee bilateral  2002/2004  . Carotid endarterectomy Left    HPI:  Pt is a 82 y.o. male with a known history of GERD w/ c/o dysmotility(on a PPI), hyperlipidemia, arthritis, stroke, CML- for last 1 month has admission office CML and his white cell count is going up. And he is having regular follow-up with his oncologist. For last 2 days he started having cough and gradually getting more short of breath. He also have pain on his right side lower chest which is severe, sharp shooting. Pt indicated he feels dysmotility when eating meats "sometimes" and described that the meat "gets caught here" pointing to his mid-sternum and higher area of his chest. Pt denied any s/s of aspiration when eating/drinking. Some family aware of pt's complaints. Noted effortful respirations w/ any exertion.   Assessment / Plan / Recommendation Clinical Impression  Pt appeared to present w/ an adequate oropharyngeal phase swallow function; difficult to fully assess sec. to the minimal po trials pt accepted but he stated he had finished eating his lunch and did not want more. Pt exhibited no immediate, overt s/s of aspiration w/ the trials of thin liquids and purees he consumed; vocal quality clear but gravely. Noted the same increased respiratory effort during po trials as at baseline w/ exertion of any kind. Encouraged pt to not  talk and to take rest breaks b/t trials to lessen WOB/SOB. Also encouraged pt to take small, single sips and bites - slowly. Pt fed self w/ setup. No trials of solids were taken w/ SLP but pt had eaten meatloaf w/ family present and family denied any difficulty chewing/masticating or swallowing the meatloaf. Pt has had a chronic "cough" since last admission per family. Unsure if this is related to Reflux but would rec. a consult to learn more about effects of Esophageal dysmotility and reflux. Rec. a diet w/ broken down meats/foods such as a dys. 3 w/ well-moistened foods. Strict aspiration precautions; meds in puree for easier swallowing. Rec. less distractions and talking during meals. The aspiration and reflux precautions were discussed w/ pt and family, moreso w/ family outside of room. Family will attempt to monitor pt's behaviors during meals and assist pt at meals. ST will f/u w/ further education as nec./indicated while admitted. Family agreed. NSG updated.     Aspiration Risk  Mild aspiration risk (Reflux aspiration risk)    Diet Recommendation  Dys. 3(mech soft) foods moistened well; thin liquids; aspiration precautions; Reflux precautions  Medication Administration: Whole meds with puree (as nec.)    Other  Recommendations Recommended Consults: Consider GI evaluation Oral Care Recommendations: Oral care BID;Staff/trained caregiver to provide oral care   Follow up Recommendations  Skilled Nursing facility;Home health SLP (TBD)    Frequency and Duration  (TBD)   (TBD)       Prognosis Prognosis for Safe Diet Advancement: Fair Barriers to Reach Goals:  (medical status)  Swallow Study   General Date of Onset: 05/22/2015 HPI: Pt is a 82 y.o. male with a known history of GERD w/ c/o dysmotility(on a PPI), hyperlipidemia, arthritis, stroke, CML- for last 1 month has admission office CML and his white cell count is going up. And he is having regular follow-up with his oncologist. For  last 2 days he started having cough and gradually getting more short of breath. He also have pain on his right side lower chest which is severe, sharp shooting. Pt indicated he feels dysmotility when eating meats "sometimes" and described that the meat "gets caught here" pointing to his mid-sternum and higher area of his chest. Pt denied any s/s of aspiration when eating/drinking. Some family aware of pt's complaints. Noted effortful respirations w/ any exertion. Type of Study: Bedside Swallow Evaluation Previous Swallow Assessment: none Diet Prior to this Study: Regular;Thin liquids Temperature Spikes Noted: No (wbc 47.9) Respiratory Status: Nasal cannula (3 liters) History of Recent Intubation: No Behavior/Cognition: Alert;Cooperative;Pleasant mood;Distractible;Requires cueing Oral Cavity Assessment: Other (comment) (not completed as pt was eating his lunch meal) Oral Care Completed by SLP: No Oral Cavity - Dentition: Missing dentition (missing some front dentition) Vision: Functional for self-feeding Self-Feeding Abilities: Able to feed self;Needs assist;Needs set up Patient Positioning: Upright in bed (once positioned better) Baseline Vocal Quality:  (gravely; effortful w/ increased respiratory effort) Volitional Cough: Strong Volitional Swallow: Able to elicit    Oral/Motor/Sensory Function     Ice Chips Ice chips: Not tested   Thin Liquid Thin Liquid: Within functional limits Presentation: Self Fed;Straw (3 swallows counted) Other Comments: pt would not take any further trials of liquids    Nectar Thick Nectar Thick Liquid: Not tested   Honey Thick Honey Thick Liquid: Not tested   Puree Puree: Within functional limits Presentation: Self Fed;Spoon (7-8 trials)   Solid   GO   Solid: Not tested Other Comments: pt would not take any further trials from his lunch meal       Orinda Kenner, MS, CCC-SLP  Watson,Katherine 05/20/2015,1:38 PM

## 2015-05-20 NOTE — Progress Notes (Signed)
Patient with complaints of SOB this morning.  BP 88/64 mmHg  Pulse 113  Temp(Src) 97.7 F (36.5 C) (Oral)  Resp 20  Ht 5\' 6"  (1.676 m)  Wt 183 lb 12.8 oz (83.371 kg)  BMI 29.68 kg/m2  SpO2 93%  Spoke with Dr. Marcille Blanco.  2mg  of Morphine given to slow RR and STAT chest xray ordered.  Patient with expiratory wheezing from RLL.

## 2015-05-20 NOTE — Progress Notes (Signed)
ANTIBIOTIC CONSULT NOTE - INITIAL  Pharmacy Consult for Vancomycin/Zosyn Indication: pneumonia  No Known Allergies  Patient Measurements: Height: 5\' 6"  (167.6 cm) Weight: 183 lb 12.8 oz (83.371 kg) IBW/kg (Calculated) : 63.8 Adjusted Body Weight: 71.6 kg  Vital Signs: Temp: 97.7 F (36.5 C) (01/14 0530) Temp Source: Oral (01/14 0530) BP: 121/70 mmHg (01/14 0830) Pulse Rate: 113 (01/14 0530) Intake/Output from previous day: 01/13 0701 - 01/14 0700 In: -  Out: 200 [Urine:200] Intake/Output from this shift:    Labs:  Recent Labs  06/05/2015 1535 05/10/2015 1736 05/19/15 0633 05/20/15 0625  WBC 43.2*  --  43.1* 47.9*  HGB 10.3*  --  8.4* 9.3*  PLT 185  --  165 196  CREATININE  --  1.77* 1.79* 1.80*   Estimated Creatinine Clearance: 26 mL/min (by C-G formula based on Cr of 1.8). No results for input(s): VANCOTROUGH, VANCOPEAK, VANCORANDOM, GENTTROUGH, GENTPEAK, GENTRANDOM, TOBRATROUGH, TOBRAPEAK, TOBRARND, AMIKACINPEAK, AMIKACINTROU, AMIKACIN in the last 72 hours.   Microbiology: Recent Results (from the past 720 hour(s))  Blood Culture (routine x 2)     Status: None (Preliminary result)   Collection Time: 05/19/2015  3:35 PM  Result Value Ref Range Status   Specimen Description BLOOD LEFT WRIST  Final   Special Requests   Final    BOTTLES DRAWN AEROBIC AND ANAEROBIC ANA 5CC AERO [place]   Culture NO GROWTH < 24 HOURS  Final   Report Status PENDING  Incomplete  Blood Culture (routine x 2)     Status: None (Preliminary result)   Collection Time: 05/23/2015  3:36 PM  Result Value Ref Range Status   Specimen Description BLOOD RIGHT HAND  Final   Special Requests   Final    BOTTLES DRAWN AEROBIC AND ANAEROBIC ANA 2CC AERO 3CC   Culture NO GROWTH < 24 HOURS  Final   Report Status PENDING  Incomplete  Urine culture     Status: None   Collection Time: 06/05/2015  8:52 PM  Result Value Ref Range Status   Specimen Description URINE, RANDOM  Final   Special Requests NONE  Final   Culture INSIGNIFICANT GROWTH  Final   Report Status 05/20/2015 FINAL  Final    Medical History: Past Medical History  Diagnosis Date  . Hyperlipidemia   . GERD (gastroesophageal reflux disease)   . Arthritis   . CVA (cerebral infarction)   . CML (chronic myelocytic leukemia) ([facility])     Medications:  Scheduled:  . azithromycin  250 mg Intravenous Q24H  . budesonide (PULMICORT) nebulizer solution  0.25 mg Nebulization BID  . calcium-vitamin D  1 tablet Oral Daily  . carvedilol  6.25 mg Oral BID WC  . dipyridamole-aspirin  1 capsule Oral BID  . docusate sodium  100 mg Oral BID  . enoxaparin (LOVENOX) injection  30 mg Subcutaneous Q24H  . finasteride  5 mg Oral Daily  . ipratropium-albuterol  3 mL Nebulization Q6H  . loratadine  10 mg Oral Daily  . magnesium oxide  400 mg Oral QHS  . methylPREDNISolone (SOLU-MEDROL) injection  40 mg Intravenous Q6H  . multivitamin with minerals  1 tablet Oral Daily  . pantoprazole  40 mg Oral Daily  . piperacillin-tazobactam (ZOSYN)  IV  3.375 g Intravenous 3 times per day  . senna-docusate  1 tablet Oral QHS  . simvastatin  20 mg Oral QHS  . tamsulosin  0.4 mg Oral QHS  . valACYclovir  1,000 mg Oral Daily  . vancomycin  750 mg  Intravenous Q24H   Infusions:   Assessment: Patient is a 82 yo male receiving empiric antibiotic therapy for pneumonia with Azithromycin 250 mg IV q24h.  MD would like to add on aggressive antibiotic management with IV Vancomycin and Zosyn.  Patient previously on Vancomycin and Zosyn.  Last dose of Vancomycin 750 mg on 1/13 at 0000  SCr: 1.8, est CrCl~26 mL/min, ke: 0.026, t1/2: 26.65 h, Vd: 50.1 L  Goal of Therapy:  Vancomycin trough level 15-20 mcg/ml  Plan:  Will restart patient on Vancomycin 750 mg IV q24h. No stack needed as patient received a dose on 1/13 at renal function is not stable.  Will order trough prior to dose on 1/17 at 0900.  Will restart Zosyn 3.375 gm VI q8h per EI protocol.   Pharmacy will  continue to follow.   Andre Swander G 05/20/2015,10:57 AM

## 2015-05-21 MED ORDER — QUETIAPINE FUMARATE 25 MG PO TABS
25.0000 mg | ORAL_TABLET | Freq: Every day | ORAL | Status: DC
  Administered 2015-05-21 – 2015-05-28 (×8): 25 mg via ORAL
  Filled 2015-05-21 (×8): qty 1

## 2015-05-21 MED ORDER — ZIPRASIDONE MESYLATE 20 MG IM SOLR
10.0000 mg | Freq: Once | INTRAMUSCULAR | Status: AC
  Administered 2015-05-21: 16:00:00 10 mg via INTRAMUSCULAR
  Filled 2015-05-21: qty 20

## 2015-05-21 MED ORDER — METHYLPREDNISOLONE SODIUM SUCC 40 MG IJ SOLR
20.0000 mg | Freq: Every day | INTRAMUSCULAR | Status: DC
Start: 2015-05-22 — End: 2015-05-22
  Administered 2015-05-22: 11:00:00 20 mg via INTRAVENOUS
  Filled 2015-05-21: qty 1

## 2015-05-21 MED ORDER — ZIPRASIDONE MESYLATE 20 MG IM SOLR
10.0000 mg | Freq: Once | INTRAMUSCULAR | Status: AC
  Administered 2015-05-21: 10 mg via INTRAMUSCULAR
  Filled 2015-05-21: qty 20

## 2015-05-21 MED ORDER — METHYLPREDNISOLONE SODIUM SUCC 40 MG IJ SOLR: 40.0000 mg | Freq: Every day | INTRAMUSCULAR | Status: DC

## 2015-05-21 NOTE — Progress Notes (Signed)
Patient ID: Lucas Newman, male   DOB: 1919-11-27, 82 y.o.   MRN: [id number] [facility] PROGRESS NOTE  Lucas Newman [id number] DOB: February 26, 1920 DOA: 05/24/2015 PCP: Lavera Guise, MD  HPI/Subjective: As per nursing staff, the patient did not sleep well last couple nights. He's been agitated all night and received Ativan this morning. When I saw him he was still audibly wheezing and unable to give any history.   Objective: Filed Vitals:   05/20/15 1249 05/21/15 0457  BP: 95/56 147/68  Pulse: 99 95  Temp:    Resp:  20    Filed Weights   06/02/2015 1521 05/20/2015 2022  Weight: 79.379 kg (175 lb) 83.371 kg (183 lb 12.8 oz)    ROS: Review of Systems  Unable to perform ROS  Exam: Physical Exam  Constitutional: He appears lethargic.  HENT:  Nose: No mucosal edema.  Mouth/Throat: No oropharyngeal exudate or posterior oropharyngeal edema.  Throat dry  Eyes: Conjunctivae and lids are normal. Pupils are equal, round, and reactive to light.  Neck: No JVD present. Carotid bruit is not present. No edema present. No thyroid mass and no thyromegaly present.  Cardiovascular: S1 normal and S2 normal.  Exam reveals no gallop.   Murmur heard.  Systolic murmur is present with a grade of 2/6  Pulses:      Dorsalis pedis pulses are 2+ on the right side, and 2+ on the left side.  Respiratory: No respiratory distress. He has decreased breath sounds in the right middle field, the right lower field, the left middle field and the left lower field. He has wheezes in the right middle field, the right lower field, the left middle field and the left lower field. He has no rhonchi. He has no rales.  Audible upper airway wheeze heard without a stethoscope  GI: Soft. Bowel sounds are normal. There is no tenderness.  Musculoskeletal:       Right ankle: He exhibits no swelling.       Left ankle: He exhibits no swelling.  Lymphadenopathy:    He has no cervical adenopathy.   Neurological: He appears lethargic.  Skin: Skin is warm. Nails show no clubbing.  Bruising seen on arms  Psychiatric:  Lethargic    Data Reviewed: Basic Metabolic Panel:  Recent Labs Lab 05/28/2015 1736 05/19/15 0633 05/20/15 0625  NA 138 134*  --   K 4.4 3.7  --   CL 106 104  --   CO2 24 22  --   GLUCOSE 137* 144*  --   BUN 31* 30*  --   CREATININE 1.77* 1.79* 1.80*  CALCIUM 7.8* 7.4*  --    Liver Function Tests:  Recent Labs Lab 05/25/2015 1736  AST 22  ALT 12*  ALKPHOS 58  BILITOT 1.1  PROT 6.0*  ALBUMIN 2.4*    Recent Labs Lab 05/31/2015 1736  LIPASE 18   CBC:  Recent Labs Lab 05/11/2015 1535 05/19/15 0633 05/20/15 0625  WBC 43.2* 43.1* 47.9*  NEUTROABS 23.9*  --   --   HGB 10.3* 8.4* 9.3*  HCT 32.1* 26.8* 30.1*  MCV 92.9 92.3 92.8  PLT 185 165 196   Cardiac Enzymes:  Recent Labs Lab 05/10/2015 1736  TROPONINI <0.03   BNP (last 3 results)  Recent Labs  09/04/14 1200 03/20/15 1553  BNP 172.0* 125.0*      Recent Results (from the past 240 hour(s))  Blood Culture (routine x 2)     Status: None (Preliminary  result)   Collection Time: 05/17/2015  3:35 PM  Result Value Ref Range Status   Specimen Description BLOOD LEFT WRIST  Final   Special Requests   Final    BOTTLES DRAWN AEROBIC AND ANAEROBIC ANA 5CC AERO [place]   Culture NO GROWTH 2 DAYS  Final   Report Status PENDING  Incomplete  Blood Culture (routine x 2)     Status: None (Preliminary result)   Collection Time: 05/17/2015  3:36 PM  Result Value Ref Range Status   Specimen Description BLOOD RIGHT HAND  Final   Special Requests   Final    BOTTLES DRAWN AEROBIC AND ANAEROBIC ANA 2CC AERO 3CC   Culture NO GROWTH 2 DAYS  Final   Report Status PENDING  Incomplete  Urine culture     Status: None   Collection Time: 05/19/2015  8:52 PM  Result Value Ref Range Status   Specimen Description URINE, RANDOM  Final   Special Requests NONE  Final   Culture INSIGNIFICANT GROWTH  Final   Report  Status 05/20/2015 FINAL  Final     Studies: Ct Chest Wo Contrast  05/20/2015  CLINICAL DATA:  Cough and short of breath.  History of CML. EXAM: CT CHEST WITHOUT CONTRAST TECHNIQUE: Multidetector CT imaging of the chest was performed following the standard protocol without IV contrast. COMPARISON:  Chest CT 03/24/2015 FINDINGS: Mediastinum/Nodes: No axillary or supraclavicular lymphadenopathy. No mediastinal or hilar adenopathy. No pericardial fluid. Esophagus normal. Lungs/Pleura: Is new loculated pleural effusion along the RIGHT hemi thorax. Consolidated lung within the RIGHT lower lobe with air bronchograms (image 29, series 3. Small LEFT effusion with basilar atelectasis. Upper abdomen: Limited view of the liver, kidneys, pancreas are unremarkable. Normal adrenal glands. Musculoskeletal: No acute osseous abnormality. IMPRESSION: 1. Consolidation in the RIGHT lower lobe with air bronchograms is concerning for pneumonia. Atelectasis with air bronchograms could have a similar pattern. 2. New Loculated pleural fluid along the RIGHT hemi thorax. 3. Small LEFT effusion. 4. No mediastinal adenopathy Electronically Signed   By: Suzy Bouchard M.D.   On: 05/20/2015 14:17   Dg Chest Port 1 View  05/20/2015  CLINICAL DATA:  Worsening shortness of breath this morning. EXAM: PORTABLE CHEST 1 VIEW COMPARISON:  06/01/2015 FINDINGS: A pleural fluid on the right has increased. Is now appears loculated along the peripheral right mid lung. The right hemidiaphragm is obscured. Hazy opacity at the left lung base is likely due to a combination small effusion and atelectasis No consents and pulmonary edema. No pneumothorax.  Cardiac silhouette normal in size.  The IMPRESSION: 1. Worsening lung aeration on the right. This may be due to increased pleural fluid, worsened lung consolidation or a combination. Appearance of the left lung is stable with evidence of a small effusion and mild basilar atelectasis. No convincing  pulmonary edema. Electronically Signed   By: Lajean Manes M.D.   On: 05/20/2015 07:27    Scheduled Meds: . azithromycin  250 mg Intravenous Q24H  . budesonide (PULMICORT) nebulizer solution  0.25 mg Nebulization BID  . calcium-vitamin D  1 tablet Oral Daily  . carvedilol  6.25 mg Oral BID WC  . dipyridamole-aspirin  1 capsule Oral BID  . docusate sodium  100 mg Oral BID  . enoxaparin (LOVENOX) injection  30 mg Subcutaneous Q24H  . finasteride  5 mg Oral Daily  . folic acid  1 mg Oral Daily  . ipratropium-albuterol  3 mL Nebulization Q6H  . loratadine  10 mg Oral Daily  .  magnesium oxide  400 mg Oral QHS  . [START ON 05/22/2015] methylPREDNISolone (SOLU-MEDROL) injection  20 mg Intravenous Daily  . multivitamin with minerals  1 tablet Oral Daily  . pantoprazole  40 mg Oral Daily  . piperacillin-tazobactam (ZOSYN)  IV  3.375 g Intravenous 3 times per day  . QUEtiapine  25 mg Oral QHS  . senna-docusate  1 tablet Oral QHS  . simvastatin  20 mg Oral QHS  . tamsulosin  0.4 mg Oral QHS  . thiamine  100 mg Oral Daily  . valACYclovir  1,000 mg Oral Daily  . vancomycin  750 mg Intravenous Q24H   Assessment/Plan:  1. Acute encephalopathy - could be secondary to acute delirium with not sleeping for a few nights, combined with acute respiratory failure and sepsis with pneumonia. I will give Seroquel 25 mg at night to get him sleeping tonight. When necessary Haldol. Try to avoid Ativan  2. Acute respiratory failure with hypoxia. Oxygen supplementation 3. Clinical sepsis present on admission with pneumonia, tachycardia and leukocytosis. Vancomycin and Zosyn and Zithromax. Pulmonary consult appreciated. Family to talk about thoracentesis and get back to me on whether they wanted or not. 4. Atrial fibrillation with rapid ventricular response on Coreg 5. CML- white count always high but higher now. 6. Hyperlipidemia unspecified on simvastatin 7. BPH on Flomax and finasteride 8. Gastroesophageal  reflux disease on Protonix 9. Shingles- pharmacy to renally dose Valtrex 10. Chronic kidney disease stage III 11. Family may consider hospice care.  Code Status:  DNR    Disposition Plan: Spoke with family yesterday and again today  Antibiotics:  Vancomycin  Zosyn  Zithromax  Time spent: 25 minutes  Loletha Grayer  [facility]

## 2015-05-21 NOTE — Plan of Care (Signed)
Problem: Education: Goal: Knowledge of [facility] Education information/materials will improve Outcome: Progressing Patient education provided about not getting out of bed without asking for help. Patient continues with non-compliance with calling for help.  Patient with confusion and hallucinations this shift.  Complaints of seeing people in his room and "ants".  Patient with increased agitation as well.  Haldol given with no effect.  Patient has been admitted for 3 days - CIWA protocol started.  Patient given Ativan and was able to rest comfortably.  Problem: Safety: Goal: Ability to remain free from injury will improve Outcome: Progressing Patient is on high fall risk with high fall protocol with bed alarm on throughout shift. Patient not compliant with calling for assistance to get out of bed to use the bathroom. Bed alarm and chair alarm answered multiple times this shift. Continued education given about not getting out of bed without assistance.  Sitter was ordered on day shift due to impulsiveness but no sitter available.    Problem: Pain Management: Goal: General experience of comfort will improve Outcome: Progressing Patient without complaints of pain this shift.

## 2015-05-21 NOTE — Plan of Care (Signed)
Pt rested first part of shift after receiving Ativan at shift change.  According to Night shift nurse he hadn't slept in past 3 nights.  Once pt awoke this afternoon - he was very confused and agitated - pulling clothes off and tele pads off, trying to get out of bed.  Haldol had no effect;  geodone had minimal effect.  Dr ordered seroquel to help him sleep and maybe get him back on regular schedule.  Steroids reduced - family concurred that it made him very agitated.  Sitter now at bedside to keep him safe.  Pt will have thoracentesis tomorrow.

## 2015-05-21 NOTE — Progress Notes (Signed)
Patient ID: Lucas Newman, male   DOB: [birth year], 82 y.o.   MRN: [id number]  Family will proceed with thoracentesis and will order for tomorrow.

## 2015-05-21 NOTE — Progress Notes (Signed)
Pulmonary Critical Care  Initial Consult Note   ZIERRE ANDOLINA [id number] DOB: 1919/08/20 DOA: 05/21/2015   PCP: Lucas Guise, MD     HPI: Lucas Newman is a 82 y.o. male with pleural effusion. Patient had a CT scan done and this shows loculated effusion. He will need a tap to assess and may also need a surgical evaluation to drain if it is complicated by an empyema   Review of Systems:  ROS performed and is unremarkable other than noted in HPI  Past Medical History  Diagnosis Date  . Hyperlipidemia   . GERD (gastroesophageal reflux disease)   . Arthritis   . CVA (cerebral infarction)   . CML (chronic myelocytic leukemia) ([place])    Past Surgical History  Procedure Laterality Date  . Appendectomy    . Replacement total knee bilateral  2002/2004  . Carotid endarterectomy Left    Social History:  reports that he quit smoking about 31 years ago. He has never used smokeless tobacco. He reports that he does not drink alcohol or use illicit drugs.  No Known Allergies  Family History  Problem Relation Age of Onset  . Lung cancer Brother   . Diabetes type II Mother   . Hypertension Father     Prior to Admission medications   Medication Sig Start Date End Date Taking? Authorizing Provider  acetaminophen (TYLENOL) 650 MG CR tablet Take 650 mg by mouth every 4 (four) hours as needed for pain.    Yes Historical Provider, MD  Calcium Carb-Cholecalciferol (CALCIUM 600+D) 600-800 MG-UNIT TABS Take 1 tablet by mouth daily.   Yes Historical Provider, MD  carvedilol (COREG) 6.25 MG tablet Take 1 tablet (6.25 mg total) by mouth 2 (two) times daily with a meal. 03/28/15  Yes Aldean Jewett, MD  cyclobenzaprine (FLEXERIL) 5 MG tablet Take 1 tablet (5 mg total) by mouth 3 (three) times daily as needed for muscle spasms. 03/28/15  Yes Aldean Jewett, MD  dipyridamole-aspirin (AGGRENOX) 200-25 MG per 12 hr capsule Take 1 capsule by mouth 2 (two) times daily.    Yes Historical  Provider, MD  docusate sodium (COLACE) 100 MG capsule Take 1 capsule (100 mg total) by mouth 2 (two) times daily. 03/28/15  Yes Aldean Jewett, MD  finasteride (PROSCAR) 5 MG tablet Take 5 mg by mouth daily.   Yes Historical Provider, MD  fluticasone (FLONASE) 50 MCG/ACT nasal spray Place 1 spray into both nostrils 2 (two) times daily as needed for rhinitis.   Yes Historical Provider, MD  HYDROcodone-acetaminophen (NORCO/VICODIN) 5-325 MG tablet Take 1 tablet by mouth every 4 (four) hours as needed for moderate pain. 03/28/15  Yes Aldean Jewett, MD  loratadine (CLARITIN) 10 MG tablet Take 10 mg by mouth daily.   Yes Historical Provider, MD  Magnesium 250 MG TABS Take 250 mg by mouth at bedtime.    Yes Historical Provider, MD  Multiple Vitamin (MULTIVITAMIN WITH MINERALS) TABS tablet Take 1 tablet by mouth daily.   Yes Historical Provider, MD  omeprazole (PRILOSEC) 20 MG capsule Take 20 mg by mouth 2 (two) times daily.    Yes Historical Provider, MD  predniSONE (DELTASONE) 5 MG tablet Take 2.5 mg by mouth daily.    Yes Historical Provider, MD  senna-docusate (SENOKOT-S) 8.6-50 MG tablet Take 1 tablet by mouth at bedtime.   Yes Historical Provider, MD  simvastatin (ZOCOR) 20 MG tablet Take 20 mg by mouth at bedtime.    Yes Historical Provider,  MD  tamsulosin (FLOMAX) 0.4 MG CAPS capsule Take 0.4 mg by mouth at bedtime.    Yes Historical Provider, MD  chlorpheniramine-HYDROcodone (TUSSIONEX) 10-8 MG/5ML SUER Take 5 mLs by mouth every 12 (twelve) hours as needed for cough. 03/28/15   Aldean Jewett, MD  guaiFENesin (ROBITUSSIN) 100 MG/5ML SOLN Take 10 mLs (200 mg total) by mouth every 4 (four) hours as needed for cough or to loosen phlegm. 03/28/15   Aldean Jewett, MD   Physical Exam: Filed Vitals:   05/20/15 1249 05/20/15 2020 05/21/15 0457 05/21/15 0748  BP: 95/56  147/68   Pulse: 99  95   Temp:      TempSrc:      Resp:   20   Height:      Weight:      SpO2: 93% 92% 99% 95%     Wt Readings from Last 3 Encounters:  05/26/2015 83.371 kg (183 lb 12.8 oz)  04/26/15 83.008 kg (183 lb)  03/28/15 83.28 kg (183 lb 9.6 oz)    General:  Appears calm and comfortable Eyes: PERRL, normal lids, irises & conjunctiva ENT: grossly normal hearing, lips & tongue Neck: no LAD, masses or thyromegaly Cardiovascular: RRR, no m/r/g. No LE edema. Respiratory: no w/r/r. Normal respiratory effort. Abdomen: soft, nontender Skin: no rash or induration seen on limited exam          Labs on Admission:  Basic Metabolic Panel:  Recent Labs Lab 05/22/2015 1736 05/19/15 0633 05/20/15 0625  NA 138 134*  --   K 4.4 3.7  --   CL 106 104  --   CO2 24 22  --   GLUCOSE 137* 144*  --   BUN 31* 30*  --   CREATININE 1.77* 1.79* 1.80*  CALCIUM 7.8* 7.4*  --    Liver Function Tests:  Recent Labs Lab 05/25/2015 1736  AST 22  ALT 12*  ALKPHOS 58  BILITOT 1.1  PROT 6.0*  ALBUMIN 2.4*    Recent Labs Lab 05/22/2015 1736  LIPASE 18   No results for input(s): AMMONIA in the last 168 hours. CBC:  Recent Labs Lab 05/15/2015 1535 05/19/15 0633 05/20/15 0625  WBC 43.2* 43.1* 47.9*  NEUTROABS 23.9*  --   --   HGB 10.3* 8.4* 9.3*  HCT 32.1* 26.8* 30.1*  MCV 92.9 92.3 92.8  PLT 185 165 196   Cardiac Enzymes:  Recent Labs Lab 05/23/2015 1736  TROPONINI <0.03    BNP (last 3 results)  Recent Labs  09/04/14 1200 03/20/15 1553  BNP 172.0* 125.0*    ProBNP (last 3 results) No results for input(s): PROBNP in the last 8760 hours.  CBG: No results for input(s): GLUCAP in the last 168 hours.  Radiological Exams on Admission: Ct Chest Wo Contrast  05/20/2015  CLINICAL DATA:  Cough and short of breath.  History of CML. EXAM: CT CHEST WITHOUT CONTRAST TECHNIQUE: Multidetector CT imaging of the chest was performed following the standard protocol without IV contrast. COMPARISON:  Chest CT 03/24/2015 FINDINGS: Mediastinum/Nodes: No axillary or supraclavicular lymphadenopathy. No  mediastinal or hilar adenopathy. No pericardial fluid. Esophagus normal. Lungs/Pleura: Is new loculated pleural effusion along the RIGHT hemi thorax. Consolidated lung within the RIGHT lower lobe with air bronchograms (image 29, series 3. Small LEFT effusion with basilar atelectasis. Upper abdomen: Limited view of the liver, kidneys, pancreas are unremarkable. Normal adrenal glands. Musculoskeletal: No acute osseous abnormality. IMPRESSION: 1. Consolidation in the RIGHT lower lobe with air bronchograms is concerning  for pneumonia. Atelectasis with air bronchograms could have a similar pattern. 2. New Loculated pleural fluid along the RIGHT hemi thorax. 3. Small LEFT effusion. 4. No mediastinal adenopathy Electronically Signed   By: Suzy Bouchard M.D.   On: 05/20/2015 14:17   Dg Chest Port 1 View  05/20/2015  CLINICAL DATA:  Worsening shortness of breath this morning. EXAM: PORTABLE CHEST 1 VIEW COMPARISON:  05/10/2015 FINDINGS: A pleural fluid on the right has increased. Is now appears loculated along the peripheral right mid lung. The right hemidiaphragm is obscured. Hazy opacity at the left lung base is likely due to a combination small effusion and atelectasis No consents and pulmonary edema. No pneumothorax.  Cardiac silhouette normal in size.  The IMPRESSION: 1. Worsening lung aeration on the right. This may be due to increased pleural fluid, worsened lung consolidation or a combination. Appearance of the left lung is stable with evidence of a small effusion and mild basilar atelectasis. No convincing pulmonary edema. Electronically Signed   By: Lajean Manes M.D.   On: 05/20/2015 07:27      EKG: Independently reviewed.  Assessment/Plan Principal Problem:   Pneumonia Active Problems:   Shingles   Chronic myelomonocytic leukemia, in relapse ([place])   1. Pleural effusion -will get a thoracentesis to assess for empyema -will need radiology to set up -may need to consider surgical evaluation  if it is a complicated effusion  2. Pneumonia -continue with abx as you are    I have personally obtained a history, examined the patient, evaluated laboratory and imaging results, formulated the assessment and plan and placed orders.  The Patient requires high complexity decision making for assessment and support.    Allyne Gee, MD [facility] Critical [facility] Medicine Sleep Medicine

## 2015-05-22 ENCOUNTER — Inpatient Hospital Stay: Payer: Medicare Other

## 2015-05-22 LAB — PROTEIN, BODY FLUID: TOTAL PROTEIN, FLUID: 3.6 g/dL

## 2015-05-22 LAB — LACTATE DEHYDROGENASE, PLEURAL OR PERITONEAL FLUID: LD FL: 214 U/L — AB (ref 3–23)

## 2015-05-22 LAB — BASIC METABOLIC PANEL
ANION GAP: 11 (ref 5–15)
BUN: 34 mg/dL — ABNORMAL HIGH (ref 6–20)
CALCIUM: 9 mg/dL (ref 8.9–10.3)
CO2: 26 mmol/L (ref 22–32)
Chloride: 108 mmol/L (ref 101–111)
Creatinine, Ser: 1.65 mg/dL — ABNORMAL HIGH (ref 0.61–1.24)
GFR, EST AFRICAN AMERICAN: 40 mL/min — AB (ref >60)
GFR, EST NON AFRICAN AMERICAN: 34 mL/min — AB (ref >60)
Glucose, Bld: 141 mg/dL — ABNORMAL HIGH (ref 65–99)
Potassium: 3.5 mmol/L (ref 3.5–5.1)
Sodium: 145 mmol/L (ref 135–145)

## 2015-05-22 LAB — CBC
HEMATOCRIT: 30.1 % — AB (ref 40.0–52.0)
Hemoglobin: 9.5 g/dL — ABNORMAL LOW (ref 13.0–18.0)
MCH: 28.6 pg (ref 26.0–34.0)
MCHC: 31.5 g/dL — ABNORMAL LOW (ref 32.0–36.0)
MCV: 90.8 fL (ref 80.0–100.0)
PLATELETS: 243 10*3/uL (ref 150–440)
RBC: 3.32 MIL/uL — AB (ref 4.40–5.90)
RDW: 15.8 % — AB (ref 11.5–14.5)
WBC: 53.6 10*3/uL — AB (ref 3.8–10.6)

## 2015-05-22 LAB — GLUCOSE, SEROUS FLUID: GLUCOSE FL: 141 mg/dL

## 2015-05-22 LAB — BODY FLUID CELL COUNT WITH DIFFERENTIAL
Eos, Fluid: 0 %
Lymphs, Fluid: 10 %
Monocyte-Macrophage-Serous Fluid: 13 %
Neutrophil Count, Fluid: 77 %
Other Cells, Fluid: 0 %
Total Nucleated Cell Count, Fluid: 2175 cu mm

## 2015-05-22 MED ORDER — IPRATROPIUM-ALBUTEROL 0.5-2.5 (3) MG/3ML IN SOLN
3.0000 mL | Freq: Three times a day (TID) | RESPIRATORY_TRACT | Status: DC
  Administered 2015-05-22 – 2015-05-24 (×6): 3 mL via RESPIRATORY_TRACT
  Filled 2015-05-22 (×6): qty 3

## 2015-05-22 MED ORDER — ENSURE ENLIVE PO LIQD
237.0000 mL | Freq: Three times a day (TID) | ORAL | Status: DC
  Administered 2015-05-22 – 2015-05-26 (×12): 237 mL via ORAL

## 2015-05-22 MED ORDER — MIDAZOLAM HCL 5 MG/5ML IJ SOLN
INTRAMUSCULAR | Status: AC
  Administered 2015-05-22: 15:00:00
  Filled 2015-05-22: qty 10

## 2015-05-22 MED ORDER — IPRATROPIUM-ALBUTEROL 0.5-2.5 (3) MG/3ML IN SOLN
3.0000 mL | Freq: Four times a day (QID) | RESPIRATORY_TRACT | Status: DC | PRN
  Administered 2015-05-25 – 2015-05-29 (×3): 3 mL via RESPIRATORY_TRACT
  Filled 2015-05-22 (×3): qty 3

## 2015-05-22 MED ORDER — FENTANYL CITRATE (PF) 100 MCG/2ML IJ SOLN
INTRAMUSCULAR | Status: AC
  Administered 2015-05-22: 16:00:00
  Filled 2015-05-22: qty 4

## 2015-05-22 MED ORDER — FENTANYL CITRATE (PF) 100 MCG/2ML IJ SOLN
INTRAMUSCULAR | Status: AC | PRN
  Administered 2015-05-22: 50 ug via INTRAVENOUS
  Administered 2015-05-22: 25 ug via INTRAVENOUS

## 2015-05-22 MED ORDER — MIDAZOLAM HCL 5 MG/5ML IJ SOLN
INTRAMUSCULAR | Status: AC | PRN
  Administered 2015-05-22: 16:00:00 1 mg via INTRAVENOUS
  Administered 2015-05-22: 16:00:00 0.5 mg via INTRAVENOUS

## 2015-05-22 MED ORDER — DEXTROSE-NACL 5-0.9 % IV SOLN
INTRAVENOUS | Status: DC
  Administered 2015-05-22 – 2015-05-25 (×4): via INTRAVENOUS

## 2015-05-22 NOTE — Plan of Care (Signed)
Problem: Education: Goal: Knowledge of [facility] Education information/materials will improve Outcome: Not Progressing Confused   Problem: Safety: Goal: Ability to remain free from injury will improve Outcome: Progressing Safety sitter at bedside

## 2015-05-22 NOTE — Care Management Important Message (Signed)
Important Message  Patient Details  Name: Lucas Newman MRN: [id number] Date of Birth: 08/15/1919   Medicare Important Message Given:  Yes    Shelbie Ammons, RN 05/22/2015, 9:41 AM

## 2015-05-22 NOTE — Procedures (Signed)
Under CT guidance, 50 mls of serous fluid was aspirated from loculated right pleural effusion. Sample sent to lab.  Since fluid was serous and suspicion of infection low, no drainage catheter was placed. No immediate complications.

## 2015-05-22 NOTE — Progress Notes (Signed)
Patient ID: Lucas Newman, male   DOB: 13-Mar-1920, 82 y.o.   MRN: [id number] [facility] PROGRESS NOTE  COHL WYSINGER [id number] DOB: [birth year] DOA: 05/11/2015 PCP: Lavera Guise, MD  HPI/Subjective: Patient still disoriented. Able to answer some yes or no questions. Does not feel well. Still with short of breath. Patient slept last night with seroquel.   Objective: Filed Vitals:   05/22/15 0525 05/22/15 1321  BP: 146/79 148/74  Pulse: 114 106  Temp: 97.8 F (36.6 C) 98.4 F (36.9 C)  Resp: 20     Filed Weights   06/06/2015 1521 05/27/2015 2022  Weight: 79.379 kg (175 lb) 83.371 kg (183 lb 12.8 oz)    ROS: Review of Systems  Unable to perform ROS Respiratory: Positive for cough and sputum production.   Cardiovascular: Negative for chest pain.  Gastrointestinal: Negative for abdominal pain.   limited with altered mental status  Exam: Physical Exam  Constitutional: He appears lethargic.  HENT:  Nose: No mucosal edema.  Mouth/Throat: No oropharyngeal exudate or posterior oropharyngeal edema.  Throat dry  Eyes: Conjunctivae and lids are normal. Pupils are equal, round, and reactive to light.  Neck: No JVD present. Carotid bruit is not present. No edema present. No thyroid mass and no thyromegaly present.  Cardiovascular: S1 normal and S2 normal.  Exam reveals no gallop.   Murmur heard.  Systolic murmur is present with a grade of 2/6  Pulses:      Dorsalis pedis pulses are 2+ on the right side, and 2+ on the left side.  Respiratory: No respiratory distress. He has decreased breath sounds in the right lower field and the left lower field. He has wheezes in the right lower field and the left lower field. He has no rhonchi. He has no rales.  Audible upper airway wheeze heard without a stethoscope  GI: Soft. Bowel sounds are normal. There is no tenderness.  Musculoskeletal:       Right ankle: He exhibits no swelling.       Left ankle: He exhibits no  swelling.  Lymphadenopathy:    He has no cervical adenopathy.  Neurological: He appears lethargic.  Skin: Skin is warm. Nails show no clubbing.  Bruising seen on arms. Rash on back seems diffuse throughout the entire back maculopapular in nature.  Psychiatric:  Lethargic    Data Reviewed: Basic Metabolic Panel:  Recent Labs Lab 05/16/2015 1736 05/19/15 0633 05/20/15 0625 05/22/15 0442  NA 138 134*  --  145  K 4.4 3.7  --  3.5  CL 106 104  --  108  CO2 24 22  --  26  GLUCOSE 137* 144*  --  141*  BUN 31* 30*  --  34*  CREATININE 1.77* 1.79* 1.80* 1.65*  CALCIUM 7.8* 7.4*  --  9.0   Liver Function Tests:  Recent Labs Lab 05/17/2015 1736  AST 22  ALT 12*  ALKPHOS 58  BILITOT 1.1  PROT 6.0*  ALBUMIN 2.4*    Recent Labs Lab 05/15/2015 1736  LIPASE 18   CBC:  Recent Labs Lab 05/17/2015 1535 05/19/15 0633 05/20/15 0625 05/22/15 0442  WBC 43.2* 43.1* 47.9* 53.6*  NEUTROABS 23.9*  --   --   --   HGB 10.3* 8.4* 9.3* 9.5*  HCT 32.1* 26.8* 30.1* 30.1*  MCV 92.9 92.3 92.8 90.8  PLT 185 165 196 243   Cardiac Enzymes:  Recent Labs Lab 06/02/2015 1736  TROPONINI <0.03   BNP (last 3 results)  Recent Labs  09/04/14 1200 03/20/15 1553  BNP 172.0* 125.0*      Recent Results (from the past 240 hour(s))  Blood Culture (routine x 2)     Status: None (Preliminary result)   Collection Time: 05/28/2015  3:35 PM  Result Value Ref Range Status   Specimen Description BLOOD LEFT WRIST  Final   Special Requests   Final    BOTTLES DRAWN AEROBIC AND ANAEROBIC ANA 5CC AERO [place]   Culture NO GROWTH 3 DAYS  Final   Report Status PENDING  Incomplete  Blood Culture (routine x 2)     Status: None (Preliminary result)   Collection Time: 05/20/2015  3:36 PM  Result Value Ref Range Status   Specimen Description BLOOD RIGHT HAND  Final   Special Requests   Final    BOTTLES DRAWN AEROBIC AND ANAEROBIC ANA 2CC AERO 3CC   Culture NO GROWTH 3 DAYS  Final   Report Status PENDING   Incomplete  Urine culture     Status: None   Collection Time: 05/28/2015  8:52 PM  Result Value Ref Range Status   Specimen Description URINE, RANDOM  Final   Special Requests NONE  Final   Culture INSIGNIFICANT GROWTH  Final   Report Status 05/20/2015 FINAL  Final      Scheduled Meds: . budesonide (PULMICORT) nebulizer solution  0.25 mg Nebulization BID  . carvedilol  6.25 mg Oral BID WC  . dipyridamole-aspirin  1 capsule Oral BID  . docusate sodium  100 mg Oral BID  . feeding supplement (ENSURE ENLIVE)  237 mL Oral TID WC  . finasteride  5 mg Oral Daily  . ipratropium-albuterol  3 mL Nebulization TID  . loratadine  10 mg Oral Daily  . magnesium oxide  400 mg Oral QHS  . multivitamin with minerals  1 tablet Oral Daily  . pantoprazole  40 mg Oral Daily  . piperacillin-tazobactam (ZOSYN)  IV  3.375 g Intravenous 3 times per day  . QUEtiapine  25 mg Oral QHS  . senna-docusate  1 tablet Oral QHS  . tamsulosin  0.4 mg Oral QHS  . thiamine  100 mg Oral Daily  . valACYclovir  1,000 mg Oral Daily  . vancomycin  750 mg Intravenous Q24H   Assessment/Plan:  1. Acute encephalopathy - could be secondary to acute delirium with not sleeping for a few nights, combined with acute respiratory failure and sepsis with pneumonia. Continue Seroquel 25 mg at night to get him sleeping tonight. When necessary Haldol. Try to avoid Ativan. Discontinue some nonessential medications 2. Acute respiratory failure with hypoxia. Oxygen supplementation 3. Clinical sepsis present on admission with pneumonia, tachycardia and leukocytosis. Vancomycin and Zosyn and Zithromax. Zithromax will finish today. Pulmonary consult appreciated. CT thoracentesis today. White blood cell count still elevating. 4. Atrial fibrillation with rapid ventricular response on Coreg 5. CML- white count always high but a lot higher now. 6. Hyperlipidemia unspecified - hold simvastatin 7. BPH on Flomax and finasteride 8. Gastroesophageal  reflux disease on Protonix 9. Shingles- as per ID. Rash looks more diffuse maculopapular over the back does not look like shingles. Currently on Valtrex. 10. Chronic kidney disease stage III 11. Family may consider hospice care.  Code Status:  DNR    Disposition Plan: Spoke with family yesterday and again today  Antibiotics:  Vancomycin  Zosyn  Zithromax  Time spent: 24 minutes  Loletha Grayer  [facility]

## 2015-05-22 NOTE — Progress Notes (Signed)
Initial Nutrition Assessment   INTERVENTION:   Meals and Snacks: Cater to patient preferences  Medical Food Supplement Therapy: will recommend Ensure Enlive po TID, each supplement provides 350 kcal and 20 grams of protein; will send on meal trays as pt on isolation.   NUTRITION DIAGNOSIS:   Inadequate oral intake related to acute illness as evidenced by  (dietitian consult and pt NPO this am for procedure)  GOAL:   Patient will meet greater than or equal to 90% of their needs  MONITOR:    (Energy Intake, Electrolyte and renal Profile, Anthropometrics, Digestive System)  REASON FOR ASSESSMENT:   Consult Poor PO  ASSESSMENT:   Pt admitted with pna and weakness. Pt scheduled for thoracentesis 05/22/2015. Pt on Airborne Isolation for shingles and currently on CIWA. Per Nsg, pt became agitated now with 1:1 sitter, ativan given last night. This am pt very sleepy and difficult to arouse, per RN Dedra.   Past Medical History  Diagnosis Date  . Hyperlipidemia   . GERD (gastroesophageal reflux disease)   . Arthritis   . CVA (cerebral infarction)   . CML (chronic myelocytic leukemia) ([place])      Diet Order:  Diet regular Room service appropriate?: Yes; Fluid consistency:: Thin   Current Nutrition: Pt not eating this am secondary to procedure (thoracentesis). Per consult pt with poor po intake. Per RN no difficulty swallowing/chewing in report.   Food/Nutrition-Related History: Per MST no decrease in appetite PTA.    Scheduled Medications:  . azithromycin  250 mg Intravenous Q24H  . budesonide (PULMICORT) nebulizer solution  0.25 mg Nebulization BID  . calcium-vitamin D  1 tablet Oral Daily  . carvedilol  6.25 mg Oral BID WC  . dipyridamole-aspirin  1 capsule Oral BID  . docusate sodium  100 mg Oral BID  . finasteride  5 mg Oral Daily  . folic acid  1 mg Oral Daily  . ipratropium-albuterol  3 mL Nebulization TID  . loratadine  10 mg Oral Daily  . magnesium oxide  400 mg  Oral QHS  . methylPREDNISolone (SOLU-MEDROL) injection  20 mg Intravenous Daily  . multivitamin with minerals  1 tablet Oral Daily  . pantoprazole  40 mg Oral Daily  . piperacillin-tazobactam (ZOSYN)  IV  3.375 g Intravenous 3 times per day  . QUEtiapine  25 mg Oral QHS  . senna-docusate  1 tablet Oral QHS  . simvastatin  20 mg Oral QHS  . tamsulosin  0.4 mg Oral QHS  . thiamine  100 mg Oral Daily  . valACYclovir  1,000 mg Oral Daily  . vancomycin  750 mg Intravenous Q24H     Electrolyte/Renal Profile and Glucose Profile:   Recent Labs Lab 05/13/2015 1736 05/19/15 0633 05/20/15 0625 05/22/15 0442  NA 138 134*  --  145  K 4.4 3.7  --  3.5  CL 106 104  --  108  CO2 24 22  --  26  BUN 31* 30*  --  34*  CREATININE 1.77* 1.79* 1.80* 1.65*  CALCIUM 7.8* 7.4*  --  9.0  GLUCOSE 137* 144*  --  141*   Protein Profile:   Recent Labs Lab 06/06/2015 1736  ALBUMIN 2.4*    Gastrointestinal Profile: Last BM:  05/21/2015   Nutrition-Focused Physical Exam Findings:  Unable to complete Nutrition-Focused physical exam at this time.    Weight Change: Per [facility] encounters, pt weight stable for the past 2 months.   Height:   Ht Readings from Last 1  Encounters:  05/31/2015 5\' 6"  (1.676 m)    Weight:   Wt Readings from Last 1 Encounters:  05/28/2015 183 lb 12.8 oz (83.371 kg)    Wt Readings from Last 10 Encounters:  05/10/2015 183 lb 12.8 oz (83.371 kg)  04/26/15 183 lb (83.008 kg)  03/28/15 183 lb 9.6 oz (83.28 kg)  03/20/15 180 lb (81.647 kg)  09/04/14 185 lb 14.4 oz (84.324 kg)  06/08/14 190 lb (86.183 kg)  05/11/14 189 lb (85.73 kg)    Ideal Body Weight:     BMI:  Body mass index is 29.68 kg/(m^2).  Estimated Nutritional Needs:   Kcal:  BEE: 1412kcals, TEE: (IF 1.0-1.2)(AF 1.2) [id number]  Protein:  83-99g protein (1.0-1.2g/kg)   Fluid:  2075-2437mL of fluid (25-32mL/kg)   EDUCATION NEEDS:   No education needs identified at this time   [place], RD, LDN Pager [phone] Weekend/On-Call Pager [phone]

## 2015-05-23 LAB — CBC
HCT: 28.9 % — ABNORMAL LOW (ref 40.0–52.0)
Hemoglobin: 9.3 g/dL — ABNORMAL LOW (ref 13.0–18.0)
MCH: 29.8 pg (ref 26.0–34.0)
MCHC: 32.2 g/dL (ref 32.0–36.0)
MCV: 92.6 fL (ref 80.0–100.0)
PLATELETS: 209 10*3/uL (ref 150–440)
RBC: 3.12 MIL/uL — AB (ref 4.40–5.90)
RDW: 16 % — AB (ref 11.5–14.5)
WBC: 35.5 10*3/uL — ABNORMAL HIGH (ref 3.8–10.6)

## 2015-05-23 LAB — BASIC METABOLIC PANEL
Anion gap: 7 (ref 5–15)
BUN: 29 mg/dL — AB (ref 6–20)
CALCIUM: 8.6 mg/dL — AB (ref 8.9–10.3)
CO2: 31 mmol/L (ref 22–32)
CREATININE: 1.49 mg/dL — AB (ref 0.61–1.24)
Chloride: 107 mmol/L (ref 101–111)
GFR calc Af Amer: 45 mL/min — ABNORMAL LOW (ref >60)
GFR, EST NON AFRICAN AMERICAN: 39 mL/min — AB (ref >60)
Glucose, Bld: 168 mg/dL — ABNORMAL HIGH (ref 65–99)
POTASSIUM: 3.2 mmol/L — AB (ref 3.5–5.1)
SODIUM: 145 mmol/L (ref 135–145)

## 2015-05-23 LAB — CULTURE, BLOOD (ROUTINE X 2)
CULTURE: NO GROWTH
Culture: NO GROWTH

## 2015-05-23 LAB — VANCOMYCIN, TROUGH: VANCOMYCIN TR: 23 ug/mL — AB (ref 10–20)

## 2015-05-23 MED ORDER — POTASSIUM CHLORIDE 20 MEQ/15ML (10%) PO SOLN
40.0000 meq | Freq: Once | ORAL | Status: AC
  Administered 2015-05-23: 40 meq via ORAL
  Filled 2015-05-23: qty 30

## 2015-05-23 MED ORDER — DIPHENHYDRAMINE HCL 50 MG/ML IJ SOLN
50.0000 mg | Freq: Once | INTRAMUSCULAR | Status: AC
  Administered 2015-05-23: 23:00:00 50 mg via INTRAVENOUS
  Filled 2015-05-23: qty 1

## 2015-05-23 MED ORDER — VANCOMYCIN HCL IN DEXTROSE 750-5 MG/150ML-% IV SOLN
750.0000 mg | INTRAVENOUS | Status: DC
  Administered 2015-05-24 – 2015-05-27 (×3): 750 mg via INTRAVENOUS
  Filled 2015-05-23 (×4): qty 150

## 2015-05-23 NOTE — Progress Notes (Signed)
[facility] PROGRESS NOTE Date of Admission:  05/20/2015     ID: Lucas Newman is a 82 y.o. male with   Principal Problem:   Pneumonia Active Problems:   Shingles   Chronic myelomonocytic leukemia, in relapse ([place])   Subjective: Has remained delirious, had CT showing consolidation and loculated effusion which was tapped. cx from blood urine and pleural fluid are negative.  Has rash on back now as well.  ROS  Eleven systems are reviewed and negative except per hpi  Medications:  Antibiotics Given (last 72 hours)    Date/Time Action Medication Dose Rate   05/20/15 1709 Given   azithromycin (ZITHROMAX) 250 mg in dextrose 5 % 125 mL IVPB 250 mg 125 mL/hr   05/20/15 1822 Given   piperacillin-tazobactam (ZOSYN) IVPB 3.375 g 3.375 g 12.5 mL/hr   05/21/15 0251 Given   piperacillin-tazobactam (ZOSYN) IVPB 3.375 g 3.375 g 12.5 mL/hr   05/21/15 0807 Given   piperacillin-tazobactam (ZOSYN) IVPB 3.375 g 3.375 g 12.5 mL/hr   05/21/15 [id number] Given   vancomycin (VANCOCIN) IVPB 750 mg/150 ml premix 750 mg 150 mL/hr   05/21/15 1548 Given   piperacillin-tazobactam (ZOSYN) IVPB 3.375 g 3.375 g 12.5 mL/hr   05/21/15 1737 Given   azithromycin (ZITHROMAX) 250 mg in dextrose 5 % 125 mL IVPB 250 mg 125 mL/hr   05/21/15 2137 Given   piperacillin-tazobactam (ZOSYN) IVPB 3.375 g 3.375 g 12.5 mL/hr   05/22/15 0522 Given   piperacillin-tazobactam (ZOSYN) IVPB 3.375 g 3.375 g 12.5 mL/hr   05/22/15 1054 Given   vancomycin (VANCOCIN) IVPB 750 mg/150 ml premix 750 mg 150 mL/hr   05/22/15 1329 Given   piperacillin-tazobactam (ZOSYN) IVPB 3.375 g 3.375 g 12.5 mL/hr   05/22/15 2124 Given   piperacillin-tazobactam (ZOSYN) IVPB 3.375 g 3.375 g 12.5 mL/hr   05/23/15 0547 Given   piperacillin-tazobactam (ZOSYN) IVPB 3.375 g 3.375 g 12.5 mL/hr   05/23/15 [id number] Given   valACYclovir (VALTREX) tablet 1,000 mg 1,000 mg    05/23/15 0839 Given   vancomycin (VANCOCIN) IVPB 750 mg/150 ml premix  750 mg 150 mL/hr   05/23/15 1321 Given   piperacillin-tazobactam (ZOSYN) IVPB 3.375 g 3.375 g 12.5 mL/hr     . budesonide (PULMICORT) nebulizer solution  0.25 mg Nebulization BID  . carvedilol  6.25 mg Oral BID WC  . dipyridamole-aspirin  1 capsule Oral BID  . docusate sodium  100 mg Oral BID  . feeding supplement (ENSURE ENLIVE)  237 mL Oral TID WC  . finasteride  5 mg Oral Daily  . ipratropium-albuterol  3 mL Nebulization TID  . loratadine  10 mg Oral Daily  . magnesium oxide  400 mg Oral QHS  . multivitamin with minerals  1 tablet Oral Daily  . pantoprazole  40 mg Oral Daily  . piperacillin-tazobactam (ZOSYN)  IV  3.375 g Intravenous 3 times per day  . QUEtiapine  25 mg Oral QHS  . senna-docusate  1 tablet Oral QHS  . tamsulosin  0.4 mg Oral QHS  . thiamine  100 mg Oral Daily  . valACYclovir  1,000 mg Oral Daily  . [START ON 05/24/2015] vancomycin  750 mg Intravenous Q36H    Objective: Vital signs in last 24 hours: Temp:  [98 F (36.7 C)-98.2 F (36.8 C)] 98.2 F (36.8 C) (01/17 0804) Pulse Rate:  [53-120] 115 (01/17 0842) Resp:  [16-20] 20 (01/17 0804) BP: (106-144)/(58-80) 118/58 mmHg (01/17 0804) SpO2:  [90 %-99 %] 97 % (01/17  [id number]) FiO2 (%):  [92 %] 92 % (01/16 1538) Constitutional: He is oriented to person, place, and time. HOH, frail HENT:  Mouth/Throat: Oropharynx is clear and dry. No oropharyngeal exudate.  Cardiovascular: Normal rate, regular rhythm  Pulmonary/Chest: bibasilar rhonchi poor ar movement.  Abdominal: Soft. Bowel sounds are normal. He exhibits some distension. There is no tenderness.  Lymphadenopathy:  He has no cervical adenopathy.  Neurological: He is alert and oriented to person, place, and time.  Skin: Skin is warm and dry. Mild erythema R flank, vesicle on abd wall  Psychiatric: He has a normal mood and affect. His behavior is normal.   Lab Results  Recent Labs  05/22/15 0442 05/23/15 0434  WBC 53.6* 35.5*  HGB 9.5* 9.3*  HCT  30.1* 28.9*  NA 145 145  K 3.5 3.2*  CL 108 107  CO2 26 31  BUN 34* 29*  CREATININE 1.65* 1.49*    Microbiology: Results for orders placed or performed during the hospital encounter of 05/17/2015  Blood Culture (routine x 2)     Status: None   Collection Time: 05/26/2015  3:35 PM  Result Value Ref Range Status   Specimen Description BLOOD LEFT WRIST  Final   Special Requests   Final    BOTTLES DRAWN AEROBIC AND ANAEROBIC ANA 5CC AERO [place]   Culture NO GROWTH 5 DAYS  Final   Report Status 05/23/2015 FINAL  Final  Blood Culture (routine x 2)     Status: None   Collection Time: 05/11/2015  3:36 PM  Result Value Ref Range Status   Specimen Description BLOOD RIGHT HAND  Final   Special Requests   Final    BOTTLES DRAWN AEROBIC AND ANAEROBIC ANA 2CC AERO 3CC   Culture NO GROWTH 5 DAYS  Final   Report Status 05/23/2015 FINAL  Final  Urine culture     Status: None   Collection Time: 05/31/2015  8:52 PM  Result Value Ref Range Status   Specimen Description URINE, RANDOM  Final   Special Requests NONE  Final   Culture INSIGNIFICANT GROWTH  Final   Report Status 05/20/2015 FINAL  Final  Body fluid culture     Status: None (Preliminary result)   Collection Time: 05/22/15  3:55 PM  Result Value Ref Range Status   Specimen Description PLEURAL  Final   Special Requests Normal  Final   Gram Stain PENDING  Incomplete   Culture NO GROWTH < 24 HOURS  Final   Report Status PENDING  Incomplete    Studies/Results: Ct Image Guided Fluid Drain By Catheter  05/23/2015  CLINICAL DATA:  Loculated right pleural effusion. EXAM: CT GUIDED aspirate OF loculated right pleural effusion. ANESTHESIA/SEDATION: 1.5  Mg IV Versed; 75 mcg IV Fentanyl Total Moderate Sedation Time: 20 minutes. PROCEDURE: The procedure risks, benefits, and alternatives were explained to the patient's daughter and informed consent was obtained. The right lateral chest wall was prepped with chlorhexidinein a sterile fashion, and a sterile  drape was applied covering the operative field. Sterile gloves were used for the procedure. Local anesthesia was provided with 1% Lidocaine. Under CT guidance, 22 gauge spinal needle was directed into loculated effusion along the right lateral chest wall. Approximately 50 mL of serous fluid was aspirated and sent to the lab for further evaluation. Given the appearance of the fluid, and is not felt to be infected and therefore drainage catheter was not placed. Needle was removed and appropriate dressing was applied. Complications: None immediate. FINDINGS: Loculated  right pleural effusion is again noted. IMPRESSION: Under CT guidance, approximately 50 mL of serous fluid was aspirated from loculated right pleural effusion for diagnostic purposes. Electronically Signed   By: Marijo Conception, M.D.   On: 05/23/2015 07:15    Assessment/Plan: Lucas Newman is a 82 y.o. male with CML admitted with PNA and rash likely shingles. Has neg bcx ucx.  1 Health care associated PNA -  Complicated by loculated effusion. He has had thoracentesis which appears exudateive but not empyema. cx negative Not able to produce any sputum Cont vanco zosyn x 8 days Has finished azithro course.   If worsens would need Bronch given immunocompromise  2.  Rash - initial lesions on R flank and abd appeared to have a small vesicles but now has diffuse lesions on back.  Can stop isolation and dc valtrex Thank you very much for the consult. Will follow with you.  [place], [place]   05/23/2015, 2:31 PM

## 2015-05-23 NOTE — Progress Notes (Signed)
ANTIBIOTIC CONSULT NOTE - INITIAL  Pharmacy Consult for Vancomycin/Zosyn (Day 4/8) Indication: pneumonia  No Known Allergies  Patient Measurements: Height: 5\' 6"  (167.6 cm) Weight: 183 lb 12.8 oz (83.371 kg) IBW/kg (Calculated) : 63.8 Adjusted Body Weight: 71.6 kg  Vital Signs: Temp: 97.7 F (36.5 C) (01/17 1631) Temp Source: Oral (01/17 1631) BP: 92/79 mmHg (01/17 1631) Pulse Rate: 100 (01/17 1631) Intake/Output from previous day:   Intake/Output from this shift:    Labs:  Recent Labs  05/22/15 0442 05/23/15 0434  WBC 53.6* 35.5*  HGB 9.5* 9.3*  PLT 243 209  CREATININE 1.65* 1.49*   Estimated Creatinine Clearance: 31.4 mL/min (by C-G formula based on Cr of 1.49).  Recent Labs  05/23/15 0849  [place] 23*     Microbiology: Recent Results (from the past 720 hour(s))  Blood Culture (routine x 2)     Status: None   Collection Time: 05/23/2015  3:35 PM  Result Value Ref Range Status   Specimen Description BLOOD LEFT WRIST  Final   Special Requests   Final    BOTTLES DRAWN AEROBIC AND ANAEROBIC ANA 5CC AERO [place]   Culture NO GROWTH 5 DAYS  Final   Report Status 05/23/2015 FINAL  Final  Blood Culture (routine x 2)     Status: None   Collection Time: 05/30/2015  3:36 PM  Result Value Ref Range Status   Specimen Description BLOOD RIGHT HAND  Final   Special Requests   Final    BOTTLES DRAWN AEROBIC AND ANAEROBIC ANA 2CC AERO 3CC   Culture NO GROWTH 5 DAYS  Final   Report Status 05/23/2015 FINAL  Final  Urine culture     Status: None   Collection Time: 06/05/2015  8:52 PM  Result Value Ref Range Status   Specimen Description URINE, RANDOM  Final   Special Requests NONE  Final   Culture INSIGNIFICANT GROWTH  Final   Report Status 05/20/2015 FINAL  Final  Body fluid culture     Status: None (Preliminary result)   Collection Time: 05/22/15  3:55 PM  Result Value Ref Range Status   Specimen Description PLEURAL  Final   Special Requests Normal  Final   Gram  Stain PENDING  Incomplete   Culture NO GROWTH < 24 HOURS  Final   Report Status PENDING  Incomplete    Medical History: Past Medical History  Diagnosis Date  . Hyperlipidemia   . GERD (gastroesophageal reflux disease)   . Arthritis   . CVA (cerebral infarction)   . CML (chronic myelocytic leukemia) ([facility])     Medications:  Scheduled:  . budesonide (PULMICORT) nebulizer solution  0.25 mg Nebulization BID  . carvedilol  6.25 mg Oral BID WC  . dipyridamole-aspirin  1 capsule Oral BID  . docusate sodium  100 mg Oral BID  . feeding supplement (ENSURE ENLIVE)  237 mL Oral TID WC  . finasteride  5 mg Oral Daily  . ipratropium-albuterol  3 mL Nebulization TID  . loratadine  10 mg Oral Daily  . magnesium oxide  400 mg Oral QHS  . multivitamin with minerals  1 tablet Oral Daily  . pantoprazole  40 mg Oral Daily  . piperacillin-tazobactam (ZOSYN)  IV  3.375 g Intravenous 3 times per day  . QUEtiapine  25 mg Oral QHS  . senna-docusate  1 tablet Oral QHS  . tamsulosin  0.4 mg Oral QHS  . thiamine  100 mg Oral Daily  . valACYclovir  1,000 mg Oral  Daily  . [START ON 05/24/2015] vancomycin  750 mg Intravenous Q36H   Infusions:  . dextrose 5 % and 0.9% NaCl 40 mL/hr at 05/22/15 1712   Assessment:   Pharmacy consulted to dose vancomycin and Zosyn for PNA. Patient is currently on day 4/8 of Zosyn 3.375g IV Q8hr and vancomycin 750mg  IV Q24hr.    Goal of Therapy:  Vancomycin trough level 15-20 mcg/ml  Plan:  Will continue Zosyn 3.375g IV Q8hr. Trough elevated at 23, will transition to vancomycin 750mg  IV Q36hr. Will continue to follow renal function. Will not plan for additional trough unless otherwise clinically indicated.    Pharmacy will continue to monitor and adjust per consult.     Simpson,Michael L 05/23/2015,4:47 PM

## 2015-05-23 NOTE — Progress Notes (Addendum)
Patient ID: Lucas Newman, male   DOB: Jan 05, 1920, 82 y.o.   MRN: [id number] [facility] PROGRESS NOTE  Lucas Newman [id number] DOB: Jun 23, 1919 DOA: 05/28/2015 PCP: Lucas Guise, MD  HPI/Subjective: The pt is awake, but confused, no complaint. On sitter due to agitation but quiet now.   Objective: Filed Vitals:   05/23/15 0804 05/23/15 0842  BP: 118/58   Pulse: 120 115  Temp: 98.2 F (36.8 C)   Resp: 20     Filed Weights   05/17/2015 1521 06/02/2015 2022  Weight: 79.379 kg (175 lb) 83.371 kg (183 lb 12.8 oz)    ROS: Review of Systems  Unable to perform ROS  Exam: Physical Exam  HENT:  Nose: No mucosal edema.  Mouth/Throat: No oropharyngeal exudate or posterior oropharyngeal edema.  Throat dry  Eyes: Conjunctivae and lids are normal. Pupils are equal, round, and reactive to light.  Neck: No JVD present. Carotid bruit is not present. No edema present. No thyroid mass and no thyromegaly present.  Cardiovascular: S1 normal and S2 normal.  Exam reveals no gallop.   Murmur heard.  Systolic murmur is present with a grade of 2/6  Pulses:      Dorsalis pedis pulses are 2+ on the right side, and 2+ on the left side.  Respiratory: No respiratory distress. He has decreased breath sounds in the right middle field, the right lower field, the left middle field and the left lower field. He has no wheezes. He has no rhonchi. He has no rales.  Audible upper airway wheeze heard without a stethoscope  GI: Soft. Bowel sounds are normal. There is no tenderness.  Musculoskeletal:       Right ankle: He exhibits no swelling.       Left ankle: He exhibits no swelling.  Lymphadenopathy:    He has no cervical adenopathy.  Skin: Skin is warm. Lesion and rash noted. Nails show no clubbing.  Bruising seen on arms  Psychiatric:  Lethargic    Data Reviewed: Basic Metabolic Panel:  Recent Labs Lab 05/26/2015 1736 05/19/15 0633 05/20/15 0625 05/22/15 0442  05/23/15 0434  NA 138 134*  --  145 145  K 4.4 3.7  --  3.5 3.2*  CL 106 104  --  108 107  CO2 24 22  --  26 31  GLUCOSE 137* 144*  --  141* 168*  BUN 31* 30*  --  34* 29*  CREATININE 1.77* 1.79* 1.80* 1.65* 1.49*  CALCIUM 7.8* 7.4*  --  9.0 8.6*   Liver Function Tests:  Recent Labs Lab 05/20/2015 1736  AST 22  ALT 12*  ALKPHOS 58  BILITOT 1.1  PROT 6.0*  ALBUMIN 2.4*    Recent Labs Lab 05/08/2015 1736  LIPASE 18   CBC:  Recent Labs Lab 05/15/2015 1535 05/19/15 0633 05/20/15 0625 05/22/15 0442 05/23/15 0434  WBC 43.2* 43.1* 47.9* 53.6* 35.5*  NEUTROABS 23.9*  --   --   --   --   HGB 10.3* 8.4* 9.3* 9.5* 9.3*  HCT 32.1* 26.8* 30.1* 30.1* 28.9*  MCV 92.9 92.3 92.8 90.8 92.6  PLT 185 165 196 243 209   Cardiac Enzymes:  Recent Labs Lab 05/08/2015 1736  TROPONINI <0.03   BNP (last 3 results)  Recent Labs  09/04/14 1200 03/20/15 1553  BNP 172.0* 125.0*      Recent Results (from the past 240 hour(s))  Blood Culture (routine x 2)     Status: None   Collection  Time: 05/21/2015  3:35 PM  Result Value Ref Range Status   Specimen Description BLOOD LEFT WRIST  Final   Special Requests   Final    BOTTLES DRAWN AEROBIC AND ANAEROBIC ANA 5CC AERO [place]   Culture NO GROWTH 5 DAYS  Final   Report Status 05/23/2015 FINAL  Final  Blood Culture (routine x 2)     Status: None   Collection Time: 06/05/2015  3:36 PM  Result Value Ref Range Status   Specimen Description BLOOD RIGHT HAND  Final   Special Requests   Final    BOTTLES DRAWN AEROBIC AND ANAEROBIC ANA 2CC AERO 3CC   Culture NO GROWTH 5 DAYS  Final   Report Status 05/23/2015 FINAL  Final  Urine culture     Status: None   Collection Time: 05/12/2015  8:52 PM  Result Value Ref Range Status   Specimen Description URINE, RANDOM  Final   Special Requests NONE  Final   Culture INSIGNIFICANT GROWTH  Final   Report Status 05/20/2015 FINAL  Final  Body fluid culture     Status: None (Preliminary result)   Collection  Time: 05/22/15  3:55 PM  Result Value Ref Range Status   Specimen Description PLEURAL  Final   Special Requests Normal  Final   Gram Stain PENDING  Incomplete   Culture NO GROWTH < 24 HOURS  Final   Report Status PENDING  Incomplete     Studies: Ct Image Guided Fluid Drain By Catheter  05/23/2015  CLINICAL DATA:  Loculated right pleural effusion. EXAM: CT GUIDED aspirate OF loculated right pleural effusion. ANESTHESIA/SEDATION: 1.5  Mg IV Versed; 75 mcg IV Fentanyl Total Moderate Sedation Time: 20 minutes. PROCEDURE: The procedure risks, benefits, and alternatives were explained to the patient's daughter and informed consent was obtained. The right lateral chest wall was prepped with chlorhexidinein a sterile fashion, and a sterile drape was applied covering the operative field. Sterile gloves were used for the procedure. Local anesthesia was provided with 1% Lidocaine. Under CT guidance, 22 gauge spinal needle was directed into loculated effusion along the right lateral chest wall. Approximately 50 mL of serous fluid was aspirated and sent to the lab for further evaluation. Given the appearance of the fluid, and is not felt to be infected and therefore drainage catheter was not placed. Needle was removed and appropriate dressing was applied. Complications: None immediate. FINDINGS: Loculated right pleural effusion is again noted. IMPRESSION: Under CT guidance, approximately 50 mL of serous fluid was aspirated from loculated right pleural effusion for diagnostic purposes. Electronically Signed   By: Marijo Conception, M.D.   On: 05/23/2015 07:15    Scheduled Meds: . budesonide (PULMICORT) nebulizer solution  0.25 mg Nebulization BID  . carvedilol  6.25 mg Oral BID WC  . dipyridamole-aspirin  1 capsule Oral BID  . docusate sodium  100 mg Oral BID  . feeding supplement (ENSURE ENLIVE)  237 mL Oral TID WC  . finasteride  5 mg Oral Daily  . ipratropium-albuterol  3 mL Nebulization TID  . loratadine   10 mg Oral Daily  . magnesium oxide  400 mg Oral QHS  . multivitamin with minerals  1 tablet Oral Daily  . pantoprazole  40 mg Oral Daily  . piperacillin-tazobactam (ZOSYN)  IV  3.375 g Intravenous 3 times per day  . QUEtiapine  25 mg Oral QHS  . senna-docusate  1 tablet Oral QHS  . tamsulosin  0.4 mg Oral QHS  . thiamine  100 mg Oral Daily  . valACYclovir  1,000 mg Oral Daily  . [START ON 05/24/2015] vancomycin  750 mg Intravenous Q36H   Assessment/Plan:  1. Acute encephalopathy - could be secondary to acute delirium with not sleeping for a few nights, combined with acute respiratory failure and sepsis with pneumonia. Better. On Seroquel 25 mg at night and When necessary Haldol. Try to avoid Ativan  2. Acute respiratory failure with hypoxia. Oxygen supplementation 3. Clinical sepsis present on admission with pneumonia, tachycardia and leukocytosis. Continue Vancomycin and Zosyn for 8 days per Dr. Ola Spurr, finished Zithromax. S/p thoracentesis for right pleural effusioin. WBC 2175. LD 214. Per pulmonary physician, Dr. Humphrey Rolls, Dr. Genevive Bi consult for drainage. 4. Atrial fibrillation with rapid ventricular response on Coreg 5. CML- white count always high.  6. Hyperlipidemia unspecified on simvastatin 7. BPH on Flomax and finasteride 8. Gastroesophageal reflux disease on Protonix 9. Rash, not Shingles, per Dr. Ola Spurr, discontinue Valtrex 10. Chronic kidney disease stage III 11. Family may consider hospice care. Palliative care consult and PT consult. 21.  Called his daughter, nobody answered the phone. Discussed with Dr. Ola Spurr and  pulmonary physician, Dr. Humphrey Rolls.  Greater than 50% time was spent on coordination of care and face-to-face counseling.  Code Status:  DNR    Disposition Plan: PT.  Antibiotics:  Vancomycin  Zosyn  Zithromax  Time spent: 42 minutes  Lucas Newman  [facility]

## 2015-05-23 NOTE — Consult Note (Signed)
Palliative Medicine Inpatient Consult Note   Name: Lucas Newman Date: 05/23/2015 MRN: [id number]  DOB: 1919-11-19  Referring Physician: Demetrios Loll, MD  Palliative Care consult requested for this 82 y.o. male for goals of medical therapy in patient with acute metabolic encephalopathy, sepsis, rash on trunk, and pneumonia.    TODAY'S DISCUSSIONS AND DECISIONS:  1.  Pt is normally his own decision-maker.  But he has had some delerium and hasn't been able to make his own health care decisions.  He is getting better in this regard, however.  His HCPOA is listed as his wife, but son says pts wife has some 'issues' and may not understand the choices etc and it would be best to talk to either him or his sister as the two of them share alternate HCPOA status.  2.  Pt is DNR.  I placed a portable DNR form in the paper chart.   3.  I have today discussed pt's CMML with Dr. Burlene Arnt, pt's oncologist.  He describes his CMM Leukemia as indolent, but given pt's age and comorbid conditions, he can be said to have a prognosis of less than 6 months time.  He is not imminently dying from his leukemia, however.  Pt has not had any treatment/ chemo for this (that could possibly arise as an option, but pt is very elderly and his performance status might limit what can be done over time).  Dr Burlene Arnt does not feel the pts rash is related to pts CMML.    4.  I talked with Dr. Ola Spurr, ID, and he states pt will be getting 8 days of Vanco and Zosyn.  If he were younger, he might need a bronch, given his immunocompromised status related to his leukemia.   5.  Pt is to have a percutaneous drain placed in the right lung loculated parapneumonic effusion (to be done by interventional radiology per surgeon, Dr Nestor Lewandowsky).    6.  I called son, Lucas Newman (the only one on the list I could easily reach) and we talked at some length. The following can be stated at this time:     ---pt meets hospice criteria for hospice  services due to CMML with comorbid conditions    ----pt is NOT actively dying and therefore is not appropriate for Hospice Home    ----pt will likely get 8 days of IV Vanc and Zosyn (per Dr. Ola Spurr, ID) --so he may be here Monday when I return after a few days off.    ---pt was walking with a walker and 'normally' has good cognition (per family) so they probably will want rehab FIRST for pt (along with a Jacobus in the facility) --with this rehab stay to be followed by [facility] IN THE HOME SETTING (probably son, Larry's home in [place], [place] --rather than pts home in [place]).   ---pt will likely be here still on Monday and I will meet with entire family then if this can be arranged at that time. If he is discharged to a facility prior to my return here on Monday, I will follow up and make sure pt gets a Palliative Care consult at the rehab facility.   ----------------------------------------  IMPRESSION: Metabolic Encephalopathy ---has required a sitter and Haldol, Geodone, and also Seroquel.   ---with delerium a factor due to insomnia for several nights ---with severe illness likely causative also ---impulsive and does not call for help and has been agitated at times ---started on CIWA protocol on  05/21/15 (unclear alcohol hx) Sepsis due to Health Care Associated Pneumonia (tachycardic with high WBC) Parapneumonic Right Pleural Effusion (loculated)  --s/p thoracentesis right effusion (serous) Chronic Myelo-Monocitic Leukemia  (CMML)  This is NOT the same as CML. ---pt has not achieved remission per Dr. Aletha Halim note (oncology) --with leucocytosis noted and persistent (53-35) --with associated anemia requiring transfusions prn GERD and esophageal dysmotility (on PPI) H/O CVA DJD Rash on trunk was initially thought to be possible Shingles, but that is not felt to be the case now.  Valtrex has been DCd. Pt has had right sided chest wall pain also.  Hyperlipidemia Gait  disorder with recent falls Constipation Malnutrition ---no recent albumin but is on supplements Hypokalemia Hyperglycemia BPH Atrial Fibrillation with RVR (on Coreg) CKD stage III Mild Dysphagia ---On Dys 3 with thin liquids and aspiration precautions ---Meds whole with puree as nec.  H/O CHF (not now) with echo in Nov 2016 showing EF 50-55%.    REVIEW OF SYSTEMS:  Patient is not able to provide ROS due to confusion --but is able to answer some questions.  Feels sick and short of breath. Sleeps some with Seroquel utilized. Told PT he was 'too tired' today but to come back. Pts cognition was good with PT, however.    SPIRITUAL SUPPORT SYSTEM: Yes.  SOCIAL HISTORY:  reports that he quit smoking about 31 years ago. He has never used smokeless tobacco. He reports that he does not drink alcohol or use illicit drugs. Was at home with wife.  Was using a walker, but unable since admission.  Is on a dysphagia 3 (mech soft with thin liquids) diet here per SLP eval.  Takes meds whole in pureed.    LEGAL DOCUMENTS:  There is a valid HCPOA form and Living Will in the paper chart.  The HCPOA is pt's wife, Lucas Newman.  Alternate HCPOA's are son, Lucas Newman, and daughter, Lucas Newman.  Son states we need to call him or his sister b/c pt's wife has a number of illnesses and 'hip' problems and she 'would not be able to understand the options and choices' so we need to call him or his sister first  (and then include the pts wife).  HOWEVER, normally the pt can make his own decisions, per son.  Pt is lethargic today, but when alert, is cognitively intact.    I placed a portable DNR form in the paper chart.   CODE STATUS: DNR  PAST MEDICAL HISTORY: Past Medical History  Diagnosis Date  . Hyperlipidemia   . GERD (gastroesophageal reflux disease)   . Arthritis   . CVA (cerebral infarction)   . CML (chronic myelocytic leukemia) ([place])     PAST SURGICAL HISTORY:  Past Surgical History  Procedure Laterality  Date  . Appendectomy    . Replacement total knee bilateral  2002/2004  . Carotid endarterectomy Left     ALLERGIES:  has No Known Allergies.  MEDICATIONS:  Current Facility-Administered Medications  Medication Dose Route Frequency Provider Last Rate Last Dose  . acetaminophen (TYLENOL) tablet 650 mg  650 mg Oral Q4H PRN Vaughan Basta, MD   650 mg at 05/23/15 1321  . budesonide (PULMICORT) nebulizer solution 0.25 mg  0.25 mg Nebulization BID Loletha Grayer, MD   0.25 mg at 05/23/15 0811  . carvedilol (COREG) tablet 6.25 mg  6.25 mg Oral BID WC Vaughan Basta, MD   6.25 mg at 05/23/15 1718  . dextrose 5 %-0.9 % sodium chloride infusion   Intravenous Continuous Loletha Grayer,  MD 40 mL/hr at 05/23/15 1701    . dipyridamole-aspirin (AGGRENOX) 200-25 MG per 12 hr capsule 1 capsule  1 capsule Oral BID Vaughan Basta, MD   1 capsule at 05/23/15 0838  . docusate sodium (COLACE) capsule 100 mg  100 mg Oral BID Vaughan Basta, MD   100 mg at 05/23/15 0839  . feeding supplement (ENSURE ENLIVE) (ENSURE ENLIVE) liquid 237 mL  237 mL Oral TID WC Loletha Grayer, MD   237 mL at 05/23/15 1704  . finasteride (PROSCAR) tablet 5 mg  5 mg Oral Daily Vaughan Basta, MD   5 mg at 05/23/15 0837  . fluticasone (FLONASE) 50 MCG/ACT nasal spray 1 spray  1 spray Each Nare BID PRN Vaughan Basta, MD      . haloperidol (HALDOL) tablet 0.5 mg  0.5 mg Oral Q6H PRN Loletha Grayer, MD   0.5 mg at 05/22/15 2055  . ipratropium-albuterol (DUONEB) 0.5-2.5 (3) MG/3ML nebulizer solution 3 mL  3 mL Nebulization TID Loletha Grayer, MD   3 mL at 05/23/15 1345  . ipratropium-albuterol (DUONEB) 0.5-2.5 (3) MG/3ML nebulizer solution 3 mL  3 mL Nebulization Q6H PRN Loletha Grayer, MD      . loratadine (CLARITIN) tablet 10 mg  10 mg Oral Daily Vaughan Basta, MD   10 mg at 05/23/15 0839  . magnesium oxide (MAG-OX) tablet 400 mg  400 mg Oral QHS Vaughan Basta, MD   400 mg at  05/22/15 2052  . multivitamin with minerals tablet 1 tablet  1 tablet Oral Daily Vaughan Basta, MD   1 tablet at 05/23/15 [phone]  . ondansetron (ZOFRAN) injection 4 mg  4 mg Intravenous Q6H PRN Harrie Foreman, MD      . pantoprazole (PROTONIX) EC tablet 40 mg  40 mg Oral Daily Vaughan Basta, MD   40 mg at 05/23/15 0839  . piperacillin-tazobactam (ZOSYN) IVPB 3.375 g  3.375 g Intravenous 3 times per day Loleta Dicker, RPH   3.375 g at 05/23/15 1321  . QUEtiapine (SEROQUEL) tablet 25 mg  25 mg Oral QHS Loletha Grayer, MD   25 mg at 05/22/15 2052  . senna-docusate (Senokot-S) tablet 1 tablet  1 tablet Oral QHS Vaughan Basta, MD   1 tablet at 05/22/15 2052  . tamsulosin (FLOMAX) capsule 0.4 mg  0.4 mg Oral QHS Vaughan Basta, MD   0.4 mg at 05/22/15 2052  . thiamine (VITAMIN B-1) tablet 100 mg  100 mg Oral Daily Lytle Butte, MD   100 mg at 05/23/15 [id number]  . valACYclovir (VALTREX) tablet 1,000 mg  1,000 mg Oral Daily Loletha Grayer, MD   1,000 mg at 05/23/15 [id number]  . [START ON 05/24/2015] vancomycin (VANCOCIN) IVPB 750 mg/150 ml premix  750 mg Intravenous Q36H Charlett Nose, [facility]        Vital Signs: BP 92/79 mmHg  Pulse 100  Temp(Src) 97.7 F (36.5 C) (Oral)  Resp 20  Ht 5\' 6"  (1.676 m)  Wt 83.371 kg (183 lb 12.8 oz)  BMI 29.68 kg/m2  SpO2 100% Filed Weights   05/12/2015 1521 05/27/2015 2022  Weight: 79.379 kg (175 lb) 83.371 kg (183 lb 12.8 oz)    Estimated body mass index is 29.68 kg/(m^2) as calculated from the following:   Height as of this encounter: 5\' 6"  (1.676 m).   Weight as of this encounter: 83.371 kg (183 lb 12.8 oz).  PERFORMANCE STATUS (ECOG) : 4 - Bedbound  PHYSICAL EXAM: Sleeping and during my visit, was hard to  waken.  Later, he woke up long enough to tell PT he was too tired and needed sleep  Tele wires noted EOMI No facial assymetry No JVD or TM Hrt rrr no mgr Lungs with ronchi and decreased BS in right lat lung Abd soft and  NT Ext no mottling or cyanosis    LABS: CBC:    Component Value Date/Time   WBC 35.5* 05/23/2015 0434   WBC 23.0* 11/12/2013 0839   HGB 9.3* 05/23/2015 0434   HGB 10.0* 11/12/2013 0839   HCT 28.9* 05/23/2015 0434   HCT 30.2* 11/12/2013 0839   PLT 209 05/23/2015 0434   PLT 147* 11/12/2013 0839   MCV 92.6 05/23/2015 0434   MCV 99 11/12/2013 0839   NEUTROABS 23.9* 05/28/2015 1535   NEUTROABS 12.9* 11/12/2013 0839   LYMPHSABS 1.2 05/12/2015 1535   LYMPHSABS 2.0 11/12/2013 0839   MONOABS 18.0* 05/28/2015 1535   MONOABS 8.0* 11/12/2013 0839   EOSABS 0.1 05/24/2015 1535   EOSABS 0.0 11/12/2013 0839   BASOSABS 0.1 05/09/2015 1535   BASOSABS 0.1 11/12/2013 0839   Comprehensive Metabolic Panel:    Component Value Date/Time   NA 145 05/23/2015 0434   NA 141 11/12/2013 0839   K 3.2* 05/23/2015 0434   K 4.2 11/12/2013 0839   CL 107 05/23/2015 0434   CL 107 11/12/2013 0839   CO2 31 05/23/2015 0434   CO2 28 11/12/2013 0839   BUN 29* 05/23/2015 0434   BUN 45* 11/12/2013 0839   CREATININE 1.49* 05/23/2015 0434   CREATININE 2.09* 11/12/2013 0839   GLUCOSE 168* 05/23/2015 0434   GLUCOSE 98 11/12/2013 0839   CALCIUM 8.6* 05/23/2015 0434   CALCIUM 8.7 11/12/2013 0839   AST 22 05/17/2015 1736   ALT 12* 05/16/2015 1736   ALKPHOS 58 06/03/2015 1736   BILITOT 1.1 06/02/2015 1736   PROT 6.0* 05/31/2015 1736   ALBUMIN 2.4* 05/24/2015 1736    More than 50% of the visit was spent in counseling/coordination of care: Yes--I had to speak to multiple members of the care team and family today.  Complex issues and decision-making.   Time Spent:   120  minutes

## 2015-05-24 ENCOUNTER — Inpatient Hospital Stay: Payer: PRIVATE HEALTH INSURANCE | Attending: Internal Medicine

## 2015-05-24 ENCOUNTER — Inpatient Hospital Stay: Payer: Medicare Other

## 2015-05-24 DIAGNOSIS — J90 Pleural effusion, not elsewhere classified: Secondary | ICD-10-CM | POA: Insufficient documentation

## 2015-05-24 LAB — COMPREHENSIVE METABOLIC PANEL
ALBUMIN: 2.3 g/dL — AB (ref 3.5–5.0)
ALT: 20 U/L (ref 17–63)
ANION GAP: 9 (ref 5–15)
AST: 16 U/L (ref 15–41)
Alkaline Phosphatase: 76 U/L (ref 38–126)
BILIRUBIN TOTAL: 0.6 mg/dL (ref 0.3–1.2)
BUN: 35 mg/dL — ABNORMAL HIGH (ref 6–20)
CO2: 29 mmol/L (ref 22–32)
Calcium: 8.7 mg/dL — ABNORMAL LOW (ref 8.9–10.3)
Chloride: 108 mmol/L (ref 101–111)
Creatinine, Ser: 1.5 mg/dL — ABNORMAL HIGH (ref 0.61–1.24)
GFR calc Af Amer: 44 mL/min — ABNORMAL LOW (ref >60)
GFR, EST NON AFRICAN AMERICAN: 38 mL/min — AB (ref >60)
GLUCOSE: 180 mg/dL — AB (ref 65–99)
POTASSIUM: 3.5 mmol/L (ref 3.5–5.1)
Sodium: 146 mmol/L — ABNORMAL HIGH (ref 135–145)
TOTAL PROTEIN: 5.7 g/dL — AB (ref 6.5–8.1)

## 2015-05-24 LAB — CBC
HEMATOCRIT: 27.9 % — AB (ref 40.0–52.0)
Hemoglobin: 8.8 g/dL — ABNORMAL LOW (ref 13.0–18.0)
MCH: 29.7 pg (ref 26.0–34.0)
MCHC: 31.4 g/dL — AB (ref 32.0–36.0)
MCV: 94.7 fL (ref 80.0–100.0)
PLATELETS: 192 10*3/uL (ref 150–440)
RBC: 2.95 MIL/uL — ABNORMAL LOW (ref 4.40–5.90)
RDW: 16.1 % — AB (ref 11.5–14.5)
WBC: 37.3 10*3/uL — ABNORMAL HIGH (ref 3.8–10.6)

## 2015-05-24 LAB — CYTOLOGY - NON PAP

## 2015-05-24 MED ORDER — FENTANYL CITRATE (PF) 100 MCG/2ML IJ SOLN
INTRAMUSCULAR | Status: AC | PRN
  Administered 2015-05-24 (×2): 25 ug via INTRAVENOUS
  Administered 2015-05-24: 16:00:00 50 ug via INTRAVENOUS

## 2015-05-24 MED ORDER — IPRATROPIUM-ALBUTEROL 0.5-2.5 (3) MG/3ML IN SOLN: 3.0000 mL | Freq: Four times a day (QID) | RESPIRATORY_TRACT | Status: DC | PRN

## 2015-05-24 MED ORDER — FENTANYL CITRATE (PF) 100 MCG/2ML IJ SOLN
INTRAMUSCULAR | Status: AC
  Administered 2015-05-24: 15:00:00
  Filled 2015-05-24: qty 4

## 2015-05-24 NOTE — Progress Notes (Signed)
[facility] INFECTI[facility] PROGRESS NOTE Date of Admission:  05/08/2015     ID: AARYA IWANOWSKI is a 82 y.o. male with   Principal Problem:   Pneumonia Active Problems:   Shingles   Chronic myelomonocytic leukemia, in relapse (HCC)   Pleural effusion   Subjective: Has remained delirious, had CT showing consolidation and loculated effusion which was tapped. cx from blood urine and pleural fluid are negative.  Has rash on back now as well.  ROS  Eleven systems are reviewed and negative except per hpi  Medications:  Antibiotics Given (last 72 hours)    Date/Time Action Medication Dose Rate   05/21/15 1548 Given   piperacillin-tazobactam (ZOSYN) IVPB 3.375 g 3.375 g 12.5 mL/hr   05/21/15 1737 Given   azithromycin (ZITHROMAX) 250 mg in dextrose 5 % 125 mL IVPB 250 mg 125 mL/hr   05/21/15 2137 Given   piperacillin-tazobactam (ZOSYN) IVPB 3.375 g 3.375 g 12.5 mL/hr   05/22/15 0522 Given   piperacillin-tazobactam (ZOSYN) IVPB 3.375 g 3.375 g 12.5 mL/hr   05/22/15 1054 Given   vancomycin (VANCOCIN) IVPB 750 mg/150 ml premix 750 mg 150 mL/hr   05/22/15 1329 Given   piperacillin-tazobactam (ZOSYN) IVPB 3.375 g 3.375 g 12.5 mL/hr   05/22/15 2124 Given   piperacillin-tazobactam (ZOSYN) IVPB 3.375 g 3.375 g 12.5 mL/hr   05/23/15 0547 Given   piperacillin-tazobactam (ZOSYN) IVPB 3.375 g 3.375 g 12.5 mL/hr   05/23/15 [id number] Given   valACYclovir (VALTREX) tablet 1,000 mg 1,000 mg    05/23/15 0839 Given   vancomycin (VANCOCIN) IVPB 750 mg/150 ml premix 750 mg 150 mL/hr   05/23/15 1321 Given   piperacillin-tazobactam (ZOSYN) IVPB 3.375 g 3.375 g 12.5 mL/hr   05/23/15 2104 Given   piperacillin-tazobactam (ZOSYN) IVPB 3.375 g 3.375 g 12.5 mL/hr   05/24/15 0521 Given   piperacillin-tazobactam (ZOSYN) IVPB 3.375 g 3.375 g 12.5 mL/hr   05/24/15 [id number] Given   valACYclovir (VALTREX) tablet 1,000 mg 1,000 mg      . budesonide (PULMICORT) nebulizer solution  0.25 mg Nebulization BID  .  carvedilol  6.25 mg Oral BID WC  . dipyridamole-aspirin  1 capsule Oral BID  . docusate sodium  100 mg Oral BID  . feeding supplement (ENSURE ENLIVE)  237 mL Oral TID WC  . fentaNYL      . finasteride  5 mg Oral Daily  . loratadine  10 mg Oral Daily  . magnesium oxide  400 mg Oral QHS  . multivitamin with minerals  1 tablet Oral Daily  . pantoprazole  40 mg Oral Daily  . piperacillin-tazobactam (ZOSYN)  IV  3.375 g Intravenous 3 times per day  . QUEtiapine  25 mg Oral QHS  . senna-docusate  1 tablet Oral QHS  . tamsulosin  0.4 mg Oral QHS  . thiamine  100 mg Oral Daily  . vancomycin  750 mg Intravenous Q36H    Objective: Vital signs in last 24 hours: Temp:  [97.7 F (36.5 C)-98.7 F (37.1 C)] 98.1 F (36.7 C) (01/18 1250) Pulse Rate:  [100-108] 105 (01/18 1250) Resp:  [18-22] 22 (01/18 1250) BP: (92-122)/(60-79) 122/73 mmHg (01/18 1250) SpO2:  [92 %-100 %] 99 % (01/18 1250) Constitutional: He is oriented to person, place, and time. HOH, frail HENT:  Mouth/Throat: Oropharynx is clear and dry. No oropharyngeal exudate.  Cardiovascular: Normal rate, regular rhythm  Pulmonary/Chest: bibasilar rhonchi poor ar movement.  Abdominal: Soft. Bowel sounds are normal. He exhibits some distension. There is  no tenderness.  Lymphadenopathy:  He has no cervical adenopathy.  Neurological: He is alert and oriented to person, place, and time.  Skin: Skin is warm and dry. Mild erythema R flank, vesicle on abd wall  Psychiatric: He has a normal mood and affect. His behavior is normal.   Lab Results  Recent Labs  05/23/15 0434 05/24/15 0452  WBC 35.5* 37.3*  HGB 9.3* 8.8*  HCT 28.9* 27.9*  NA 145 146*  K 3.2* 3.5  CL 107 108  CO2 31 29  BUN 29* 35*  CREATININE 1.49* 1.50*    Microbiology: Results for orders placed or performed during the hospital encounter of 05/28/2015  Blood Culture (routine x 2)     Status: None   Collection Time: 05/17/2015  3:35 PM  Result Value Ref  Range Status   Specimen Description BLOOD LEFT WRIST  Final   Special Requests   Final    BOTTLES DRAWN AEROBIC AND ANAEROBIC ANA 5CC AERO [place]   Culture NO GROWTH 5 DAYS  Final   Report Status 05/23/2015 FINAL  Final  Blood Culture (routine x 2)     Status: None   Collection Time: 06/06/2015  3:36 PM  Result Value Ref Range Status   Specimen Description BLOOD RIGHT HAND  Final   Special Requests   Final    BOTTLES DRAWN AEROBIC AND ANAEROBIC ANA 2CC AERO 3CC   Culture NO GROWTH 5 DAYS  Final   Report Status 05/23/2015 FINAL  Final  Urine culture     Status: None   Collection Time: 05/11/2015  8:52 PM  Result Value Ref Range Status   Specimen Description URINE, RANDOM  Final   Special Requests NONE  Final   Culture INSIGNIFICANT GROWTH  Final   Report Status 05/20/2015 FINAL  Final  Body fluid culture     Status: None (Preliminary result)   Collection Time: 05/22/15  3:55 PM  Result Value Ref Range Status   Specimen Description PLEURAL  Final   Special Requests Normal  Final   Gram Stain PENDING  Incomplete   Culture NO GROWTH 2 DAYS  Final   Report Status PENDING  Incomplete    Studies/Results: Ct Image Guided Fluid Drain By Catheter  05/23/2015  CLINICAL DATA:  Loculated right pleural effusion. EXAM: CT GUIDED aspirate OF loculated right pleural effusion. ANESTHESIA/SEDATION: 1.5  Mg IV Versed; 75 mcg IV Fentanyl Total Moderate Sedation Time: 20 minutes. PROCEDURE: The procedure risks, benefits, and alternatives were explained to the patient's daughter and informed consent was obtained. The right lateral chest wall was prepped with chlorhexidinein a sterile fashion, and a sterile drape was applied covering the operative field. Sterile gloves were used for the procedure. Local anesthesia was provided with 1% Lidocaine. Under CT guidance, 22 gauge spinal needle was directed into loculated effusion along the right lateral chest wall. Approximately 50 mL of serous fluid was aspirated and  sent to the lab for further evaluation. Given the appearance of the fluid, and is not felt to be infected and therefore drainage catheter was not placed. Needle was removed and appropriate dressing was applied. Complications: None immediate. FINDINGS: Loculated right pleural effusion is again noted. IMPRESSION: Under CT guidance, approximately 50 mL of serous fluid was aspirated from loculated right pleural effusion for diagnostic purposes. Electronically Signed   By: Marijo Conception, M.D.   On: 05/23/2015 07:15    Assessment/Plan: HANSEL DEGARMO is a 82 y.o. male with CML admitted with PNA and  rash likely shingles. Has neg bcx ucx.  1 Health care associated PNA -  Complicated by loculated effusion. He has had thoracentesis which appears exudateive but not empyema. cx negative Not able to produce any sputum Cont vanco zosyn x 8 days Has finished azithro course.   If worsens would need Bronch given immunocompromise -   2.  Rash - initial lesions on R flank and abd appeared to have a small vesicles but now has diffuse lesions on back.  Stopped isolation and dcd valtrex Thank you very much for the consult. Will follow with you.  [place], Dennis   05/24/2015, 2:46 PM

## 2015-05-24 NOTE — Progress Notes (Signed)
PT Cancellation Note  Patient Details Name: Lucas Newman MRN: [id number] DOB: 06-13-1919   Cancelled Treatment:    Reason Eval/Treat Not Completed: Fatigue/lethargy limiting ability to participate. Patient has been sitting in the chair for most of the day and is drowsy upon PT entering the room. Patient initially declined therapy then stated he would reluctantly try. Patient provided very little effort in transfer and asked PT to come back another day. PT unable to complete full mobility evaluation at this time secondary to lethargy, will re-attempt at a later time/date as patient is able and appropriate.   Kerman Passey, PT, DPT    05/24/2015, 2:22 PM

## 2015-05-24 NOTE — Care Management Important Message (Signed)
Important Message  Patient Details  Name: Lucas Newman MRN: [id number] Date of Birth: June 24, 1919   Medicare Important Message Given:  Yes    [name] 05/24/2015, 10:03 AM

## 2015-05-24 NOTE — Progress Notes (Signed)
Lucas Newman   DOB:[birth year]   [id number]    Subjective: Patient is sleepy/history is limited. No family around. Objective:  Filed Vitals:   05/24/15 1600 05/24/15 1605  BP: 113/63 112/73  Pulse: 100 103  Temp:    Resp: 20 24     Intake/Output Summary (Last 24 hours) at 05/24/15 1628 Last data filed at 05/24/15 1336  Gross per 24 hour  Intake    570 ml  Output      0 ml  Net    570 ml    GENERAL: Elderly Caucasian male patient Well-nourished well-developed; Sleepy; in no distress and comfortable.  EYES: no pallor or icterus OROPHARYNX: no thrush or ulceration NECK: supple, no masses felt LYMPH: no palpable lymphadenopathy in the cervical, axillary or inguinal regions LUNGS: Decreased breath sounds bilaterally at the bases. HEART/CVS: regular rate & rhythm and no murmurs; No lower extremity edema ABDOMEN: abdomen soft, non-tender and normal bowel sounds Musculoskeletal:no cyanosis of digits and no clubbing  PSYCH: Sleepy; feels tired. NEURO: no focal motor/sensory deficits SKIN: Few macular lesions noted on the right chest wall.   Labs:  Lab Results  Component Value Date   WBC 37.3* 05/24/2015   HGB 8.8* 05/24/2015   HCT 27.9* 05/24/2015   MCV 94.7 05/24/2015   PLT 192 05/24/2015   NEUTROABS 23.9* 05/17/2015    Lab Results  Component Value Date   NA 146* 05/24/2015   K 3.5 05/24/2015   CL 108 05/24/2015   CO2 29 05/24/2015    Studies:  No results found.  Assessment & Plan:  # CMML/MDS- elevated white count ~30,000/hemoglobin 8.5-needing PRBC transfusion every 2 months or so/platelets within normal limits.  # Loculated effusion needing antibiotics/drain.  # Multiple other comorbidities including- CHF/chronic kidney disease/prior history of CVA.  Recommendations:   I had a long discussion with the palliative care physician regarding the incurable nature of patient's CMML/MDS- which however is low grade disease. At this time patient has been  needing PRBC transfusion every 2-3 months. However patient has multiple other comorbidities including CKD CHF- and also CMML does make the patient immunocompromised.   With regards to hospice; is reasonable- especially in the context of his multiple comorbidities/CMML is incurable/frequent admissions the hospital/declining performance status. However we'll defer the final recommendation for hospice to palliative care physician/family. This was discussed with palliative care physician Dr. Megan Salon in detail.    Cammie Sickle, MD 05/24/2015  4:28 PM

## 2015-05-24 NOTE — Procedures (Signed)
Interventional Radiology Procedure Note  Procedure: Placement of a 38F drain modified with additional side holes into the right loculated pleural effusion with aspiration of 200 mL mildly bloody pleural fluid.   Tube left to [name].  Complications: None  Estimated Blood Loss: 0  Recommendations: - Monitor output - tPA into drain if indicated  Signed,  Criselda Peaches, MD

## 2015-05-24 NOTE — Progress Notes (Signed)
Patient ID: Lucas Newman, male   DOB: 1920/02/05, 82 y.o.   MRN: [id number] [facility] PROGRESS NOTE  Lucas Newman [id number] DOB: 1920/03/15 DOA: 05/10/2015 PCP: Lavera Guise, MD  HPI/Subjective: The pt complained of cough and shortness of breath.   Objective: Filed Vitals:   05/24/15 0450 05/24/15 1250  BP: 118/60 122/73  Pulse: 100 105  Temp: 98.4 F (36.9 C) 98.1 F (36.7 C)  Resp: 18 22    Filed Weights   05/23/2015 1521 05/14/2015 2022  Weight: 79.379 kg (175 lb) 83.371 kg (183 lb 12.8 oz)    ROS: Review of Systems  Constitutional: Negative for fever and chills.  Respiratory: Positive for cough and shortness of breath. Negative for wheezing.   Cardiovascular: Negative for chest pain.  Gastrointestinal: Negative for nausea, vomiting and diarrhea.  Genitourinary: Negative for dysuria and frequency.  Neurological: Negative for headaches.   Exam: Physical Exam  Constitutional: He is cooperative.  HENT:  Head: Normocephalic.  Nose: No mucosal edema.  Mouth/Throat: No oropharyngeal exudate or posterior oropharyngeal edema.  Throat dry  Eyes: Conjunctivae and lids are normal. Pupils are equal, round, and reactive to light.  Neck: No JVD present. Carotid bruit is not present. No edema present. No thyroid mass and no thyromegaly present.  Cardiovascular: S1 normal and S2 normal.  Exam reveals no gallop.   Murmur heard.  Systolic murmur is present with a grade of 2/6  Pulses:      Dorsalis pedis pulses are 2+ on the right side, and 2+ on the left side.  Respiratory: No respiratory distress. He has decreased breath sounds in the right middle field, the right lower field, the left middle field and the left lower field. He has no wheezes. He has no rhonchi. He has no rales.  Audible upper airway wheeze heard without a stethoscope  GI: Soft. Bowel sounds are normal. There is no tenderness.  Musculoskeletal:       Right ankle: He exhibits no  swelling.       Left ankle: He exhibits no swelling.  Lymphadenopathy:    He has no cervical adenopathy.  Neurological: He is alert.  Skin: Skin is warm. Lesion and rash noted. Nails show no clubbing.  Bruising seen on arms    Data Reviewed: Basic Metabolic Panel:  Recent Labs Lab 05/26/2015 1736 05/19/15 0633 05/20/15 0625 05/22/15 0442 05/23/15 0434 05/24/15 0452  NA 138 134*  --  145 145 146*  K 4.4 3.7  --  3.5 3.2* 3.5  CL 106 104  --  108 107 108  CO2 24 22  --  26 31 29   GLUCOSE 137* 144*  --  141* 168* 180*  BUN 31* 30*  --  34* 29* 35*  CREATININE 1.77* 1.79* 1.80* 1.65* 1.49* 1.50*  CALCIUM 7.8* 7.4*  --  9.0 8.6* 8.7*   Liver Function Tests:  Recent Labs Lab 05/14/2015 1736 05/24/15 0452  AST 22 16  ALT 12* 20  ALKPHOS 58 76  BILITOT 1.1 0.6  PROT 6.0* 5.7*  ALBUMIN 2.4* 2.3*    Recent Labs Lab 05/24/2015 1736  LIPASE 18   CBC:  Recent Labs Lab 06/04/2015 1535 05/19/15 0633 05/20/15 0625 05/22/15 0442 05/23/15 0434 05/24/15 0452  WBC 43.2* 43.1* 47.9* 53.6* 35.5* 37.3*  NEUTROABS 23.9*  --   --   --   --   --   HGB 10.3* 8.4* 9.3* 9.5* 9.3* 8.8*  HCT 32.1* 26.8* 30.1* 30.1* 28.9*  27.9*  MCV 92.9 92.3 92.8 90.8 92.6 94.7  PLT 185 165 196 243 209 192   Cardiac Enzymes:  Recent Labs Lab 05/22/2015 1736  TROPONINI <0.03   BNP (last 3 results)  Recent Labs  09/04/14 1200 03/20/15 1553  BNP 172.0* 125.0*      Recent Results (from the past 240 hour(s))  Blood Culture (routine x 2)     Status: None   Collection Time: 05/09/2015  3:35 PM  Result Value Ref Range Status   Specimen Description BLOOD LEFT WRIST  Final   Special Requests   Final    BOTTLES DRAWN AEROBIC AND ANAEROBIC ANA 5CC AERO [place]   Culture NO GROWTH 5 DAYS  Final   Report Status 05/23/2015 FINAL  Final  Blood Culture (routine x 2)     Status: None   Collection Time: 05/20/2015  3:36 PM  Result Value Ref Range Status   Specimen Description BLOOD RIGHT HAND  Final    Special Requests   Final    BOTTLES DRAWN AEROBIC AND ANAEROBIC ANA 2CC AERO 3CC   Culture NO GROWTH 5 DAYS  Final   Report Status 05/23/2015 FINAL  Final  Urine culture     Status: None   Collection Time: 05/28/2015  8:52 PM  Result Value Ref Range Status   Specimen Description URINE, RANDOM  Final   Special Requests NONE  Final   Culture INSIGNIFICANT GROWTH  Final   Report Status 05/20/2015 FINAL  Final  Body fluid culture     Status: None (Preliminary result)   Collection Time: 05/22/15  3:55 PM  Result Value Ref Range Status   Specimen Description PLEURAL  Final   Special Requests Normal  Final   Gram Stain PENDING  Incomplete   Culture NO GROWTH 2 DAYS  Final   Report Status PENDING  Incomplete     Studies: Ct Image Guided Fluid Drain By Catheter  05/23/2015  CLINICAL DATA:  Loculated right pleural effusion. EXAM: CT GUIDED aspirate OF loculated right pleural effusion. ANESTHESIA/SEDATION: 1.5  Mg IV Versed; 75 mcg IV Fentanyl Total Moderate Sedation Time: 20 minutes. PROCEDURE: The procedure risks, benefits, and alternatives were explained to the patient's daughter and informed consent was obtained. The right lateral chest wall was prepped with chlorhexidinein a sterile fashion, and a sterile drape was applied covering the operative field. Sterile gloves were used for the procedure. Local anesthesia was provided with 1% Lidocaine. Under CT guidance, 22 gauge spinal needle was directed into loculated effusion along the right lateral chest wall. Approximately 50 mL of serous fluid was aspirated and sent to the lab for further evaluation. Given the appearance of the fluid, and is not felt to be infected and therefore drainage catheter was not placed. Needle was removed and appropriate dressing was applied. Complications: None immediate. FINDINGS: Loculated right pleural effusion is again noted. IMPRESSION: Under CT guidance, approximately 50 mL of serous fluid was aspirated from loculated  right pleural effusion for diagnostic purposes. Electronically Signed   By: Marijo Conception, M.D.   On: 05/23/2015 07:15    Scheduled Meds: . budesonide (PULMICORT) nebulizer solution  0.25 mg Nebulization BID  . carvedilol  6.25 mg Oral BID WC  . dipyridamole-aspirin  1 capsule Oral BID  . docusate sodium  100 mg Oral BID  . feeding supplement (ENSURE ENLIVE)  237 mL Oral TID WC  . finasteride  5 mg Oral Daily  . loratadine  10 mg Oral Daily  .  magnesium oxide  400 mg Oral QHS  . multivitamin with minerals  1 tablet Oral Daily  . pantoprazole  40 mg Oral Daily  . piperacillin-tazobactam (ZOSYN)  IV  3.375 g Intravenous 3 times per day  . QUEtiapine  25 mg Oral QHS  . senna-docusate  1 tablet Oral QHS  . tamsulosin  0.4 mg Oral QHS  . thiamine  100 mg Oral Daily  . vancomycin  750 mg Intravenous Q36H   Assessment/Plan:  1. Acute encephalopathy - could be secondary to acute delirium with not sleeping for a few nights, combined with acute respiratory failure and sepsis with pneumonia. Improved. On Seroquel 25 mg at night and When necessary Haldol. Try to avoid Ativan  2. Acute respiratory failure with hypoxia. Oxygen supplementation 3. Clinical sepsis present on admission with pneumonia, tachycardia and leukocytosis. Continue Vancomycin and Zosyn for 8 days per Dr. Ola Spurr, finished Zithromax. S/p thoracentesis for right pleural effusioin. WBC 2175. LD 214. Per pulmonary physician, Dr. Humphrey Rolls, Dr. Genevive Bi consult for drainage. Dr. Genevive Bi suggested This patient most likely has a loculated parapneumonic effusion. Need percutaneous drain placed. 4. Atrial fibrillation with rapid ventricular response, rate controlled,  on Coreg 5. CML- white count always high. Oncology consult. 6. Hyperlipidemia unspecified on simvastatin 7. BPH on Flomax and finasteride 8. Gastroesophageal reflux disease on Protonix 9. Rash, not Shingles, per Dr. Ola Spurr, discontinued Valtrex 10. Chronic kidney disease  stage III 11. Family may consider hospice care. Palliative care consult and PT consult. 9.  Called his daughter, nobody answered the phone. Discussed with Dr. Genevive Bi and  Dr. Megan Salon.  Greater than 50% time was spent on coordination of care and face-to-face counseling.  Code Status:  DNR    Disposition Plan: PT.  Antibiotics:  Vancomycin  Zosyn  Zithromax  Time spent: 43 minutes  Demetrios Loll  [facility]

## 2015-05-24 NOTE — Progress Notes (Signed)
Speech Language Pathology Treatment: Dysphagia  Patient Details Name: Lucas Newman MRN: [id number] DOB: 1920/03/13 Today's Date: 05/24/2015 Time: 0915-0950 SLP Time Calculation (min) (ACUTE ONLY): 35 min  Assessment / Plan / Recommendation Clinical Impression  Pt appears to adequately tolerate current dysphagia 3 consistency diet w/ thin liquids via cup and straw following general aspiration precautions w/ intermittent support and monitoring. Of note, when pt exhibited extended mastication effort and time w/ increased textured food such as bacon, he inadvertently had a coughing episode which may have been caused by the increased saliva pt held orally while masticating. Pt denied feeling as if he aspirated the bacon pieces. Also noted pt had taken a whole piece of bacon at one time which caused the increased mastication effort and time. Strongly recommended pt to take single, smaller bites and sips and chew foods well. Rec. Strict and Reflux aspiration precautions; meds in Puree. Rec. a dysphagia 3 diet w/ tougher meats chopped/moistened w/ thin liquids; straws as long as no coughing noted or decline in respiratory status. Pt should be monitored at all meals.     HPI HPI: Pt is a 82 y.o. male with a known history of GERD w/ c/o dysmotility(on a PPI), hyperlipidemia, arthritis, stroke, CML- for last 1 month has admission office CML and his white cell count is going up. And he is having regular follow-up with his oncologist. For last 2 days he started having cough and gradually getting more short of breath. He also have pain on his right side lower chest which is severe, sharp shooting. Pt indicated he feels dysmotility when eating meats "sometimes" and described that the meat "gets caught here" pointing to his mid-sternum and higher area of his chest. Pt denied any s/s of aspiration when eating/drinking when asked this morning. NSG endorsed same stating pt was able to swallow pills w/ puree  appropriately. Pt was feeding himself his breakfast meal upon entering room but quickly stated he only wanted the "bacon".       SLP Plan  Continue with current plan of care     Recommendations  Diet recommendations: Dysphagia 3 (mechanical soft);Thin liquid Liquids provided via: Cup;Straw Medication Administration: Whole meds with puree Supervision: Patient able to self feed;Intermittent supervision to cue for compensatory strategies (assist w/ setup and monitoring) Compensations: Minimize environmental distractions;Slow rate;Small sips/bites Postural Changes and/or Swallow Maneuvers: Seated upright 90 degrees;Upright 30-60 min after meal             Oral Care Recommendations: Oral care BID;Staff/trained caregiver to provide oral care Follow up Recommendations: Skilled Nursing facility;Home health SLP Plan: Continue with current plan of care     [place], [place], CCC-SLP  Korynne Dols 05/24/2015, 12:28 PM

## 2015-05-24 NOTE — Progress Notes (Signed)
Patient ID: Lucas Newman, male   DOB: [birth year], 82 y.o.   MRN: [id number]  Chief Complaint  Patient presents with  . Weakness    Referred By Dr. Bridgett Larsson  Reason for Referral Right pleural effusion  HPI Location, Quality, Duration, Severity, Timing, Context, Modifying Factors, Associated Signs and Symptoms.  Lucas Newman is a 82 y.o. male.  I have been asked to see this patient by Dr. Bridgett Larsson to consult on his right pleural effusion. This is a 82 year old male with a history of myelogenous leukemia who presented with increasing shortness of breath. He had a chest x-ray and CT scan showing the presence of a large right-sided loculated pleural effusion. His white blood cell count was elevated but is chronically so and therefore was not of use in making a diagnosis of pneumonia. However he had prior x-rays within the last year which did not show any underlying lung pathology and therefore the diagnosis of pneumonia was made and the patient was admitted to the hospital. He states that prior to being admitted to the hospital he was at home with his wife. He was able to ambulate with a walker. Since his hospitalization he has been unable to do this. He did not complain of any fevers. He did not complain of any cough. However he was somewhat somnolent during the examination and often stated that he "felt tired". I have reviewed the results of the pleural fluid analysis. It does not appear that this would be an obvious infection. I discussed his care with Dr. Jacqulynn Cadet in interventional radiology who felt that a percutaneous drainage procedure could be accomplished. I explained this to the patient. I told the patient that we could place a percutaneous catheter with subsequent intrapleural thrombolytics. He is in agreement.  Past Medical History  Diagnosis Date  . Hyperlipidemia   . GERD (gastroesophageal reflux disease)   . Arthritis   . CVA (cerebral infarction)   . CML (chronic myelocytic  leukemia) ([place])     Past Surgical History  Procedure Laterality Date  . Appendectomy    . Replacement total knee bilateral  2002/2004  . Carotid endarterectomy Left     Family History  Problem Relation Age of Onset  . Lung cancer Brother   . Diabetes type II Mother   . Hypertension Father     Social History Social History  Substance Use Topics  . Smoking status: Former Smoker -- 0.00 packs/day for 20 years    Quit date: 05/11/1984  . Smokeless tobacco: Never Used  . Alcohol Use: No     Comment: occasional beer/bloody mary    No Known Allergies  Current Facility-Administered Medications  Medication Dose Route Frequency Provider Last Rate Last Dose  . acetaminophen (TYLENOL) tablet 650 mg  650 mg Oral Q4H PRN Vaughan Basta, MD   650 mg at 05/23/15 2104  . budesonide (PULMICORT) nebulizer solution 0.25 mg  0.25 mg Nebulization BID Loletha Grayer, MD   0.25 mg at 05/24/15 0736  . carvedilol (COREG) tablet 6.25 mg  6.25 mg Oral BID WC Vaughan Basta, MD   6.25 mg at 05/24/15 0923  . dextrose 5 %-0.9 % sodium chloride infusion   Intravenous Continuous Loletha Grayer, MD 40 mL/hr at 05/23/15 1701    . dipyridamole-aspirin (AGGRENOX) 200-25 MG per 12 hr capsule 1 capsule  1 capsule Oral BID Vaughan Basta, MD   1 capsule at 05/24/15 0923  . docusate sodium (COLACE) capsule 100 mg  100 mg Oral BID  Vaughan Basta, MD   100 mg at 05/24/15 0923  . feeding supplement (ENSURE ENLIVE) (ENSURE ENLIVE) liquid 237 mL  237 mL Oral TID WC Loletha Grayer, MD   237 mL at 05/24/15 1201  . finasteride (PROSCAR) tablet 5 mg  5 mg Oral Daily Vaughan Basta, MD   5 mg at 05/24/15 [id number]  . fluticasone (FLONASE) 50 MCG/ACT nasal spray 1 spray  1 spray Each Nare BID PRN Vaughan Basta, MD      . haloperidol (HALDOL) tablet 0.5 mg  0.5 mg Oral Q6H PRN Loletha Grayer, MD   0.5 mg at 05/22/15 2055  . ipratropium-albuterol (DUONEB) 0.5-2.5 (3) MG/3ML nebulizer  solution 3 mL  3 mL Nebulization Q6H PRN Loletha Grayer, MD      . ipratropium-albuterol (DUONEB) 0.5-2.5 (3) MG/3ML nebulizer solution 3 mL  3 mL Nebulization Q6H PRN Demetrios Loll, MD      . loratadine (CLARITIN) tablet 10 mg  10 mg Oral Daily Vaughan Basta, MD   10 mg at 05/24/15 [id number]  . magnesium oxide (MAG-OX) tablet 400 mg  400 mg Oral QHS Vaughan Basta, MD   400 mg at 05/23/15 2105  . multivitamin with minerals tablet 1 tablet  1 tablet Oral Daily Vaughan Basta, MD   1 tablet at 05/24/15 [id number]  . ondansetron (ZOFRAN) injection 4 mg  4 mg Intravenous Q6H PRN Harrie Foreman, MD      . pantoprazole (PROTONIX) EC tablet 40 mg  40 mg Oral Daily Vaughan Basta, MD   40 mg at 05/24/15 0923  . piperacillin-tazobactam (ZOSYN) IVPB 3.375 g  3.375 g Intravenous 3 times per day Loleta Dicker, RPH   3.375 g at 05/24/15 0521  . QUEtiapine (SEROQUEL) tablet 25 mg  25 mg Oral QHS Loletha Grayer, MD   25 mg at 05/23/15 2105  . senna-docusate (Senokot-S) tablet 1 tablet  1 tablet Oral QHS Vaughan Basta, MD   1 tablet at 05/23/15 2105  . tamsulosin (FLOMAX) capsule 0.4 mg  0.4 mg Oral QHS Vaughan Basta, MD   0.4 mg at 05/23/15 2105  . thiamine (VITAMIN B-1) tablet 100 mg  100 mg Oral Daily Lytle Butte, MD   100 mg at 05/24/15 [id number]  . vancomycin (VANCOCIN) IVPB 750 mg/150 ml premix  750 mg Intravenous Q36H Charlett Nose, [facility]          Review of Systems A complete review of systems was asked and was negative except for the following positive findings increasing shortness of breath. Some pain with deep inspiration.  Blood pressure 122/73, pulse 105, temperature 98.1 F (36.7 C), temperature source Oral, resp. rate 22, height 5\' 6"  (1.676 m), weight 183 lb 12.8 oz (83.371 kg), SpO2 99 %.  Physical Exam CONSTITUTIONAL:  Pleasant, well-developed, well-nourished, and in no acute distress.  He was somewhat withdrawn and lethargic during the interview but  did arouse and respond appropriately LYMPH NODES:  Lymph nodes in the neck and axillae were normal RESPIRATORY:  Lungs were auscultated anteriorly and there were scattered rhonchi throughout  Normal respiratory effort without pathologic use of accessory muscles of respiration CARDIOVASCULAR: Heart was regular without murmurs.  There were no carotid bruits. GI: The abdomen was soft, nontender, and nondistended. There were no palpable masses. There was no hepatosplenomegaly. There were normal bowel sounds in all quadrants.   Data Reviewed I have independently reviewed the patient's CT scan. I discussed his care personally with the interventional radiologist and his primary care physician.  I have personally reviewed the patient's imaging, laboratory findings and medical records.    Assessment    This patient most likely has a loculated parapneumonic effusion. I do not see any evidence of a empyema at this point.    Plan    I would like to have a percutaneous drain placed. Once this is been accomplished we can then instill intrapleural thrombolytics.       Nestor Lewandowsky, MD 05/24/2015, 1:23 PM

## 2015-05-25 ENCOUNTER — Inpatient Hospital Stay: Payer: Medicare Other

## 2015-05-25 LAB — GLUCOSE, CAPILLARY: GLUCOSE-CAPILLARY: 146 mg/dL — AB (ref 65–99)

## 2015-05-25 MED ORDER — OXYCODONE-ACETAMINOPHEN 5-325 MG PO TABS
1.0000 | ORAL_TABLET | Freq: Four times a day (QID) | ORAL | Status: DC | PRN
  Administered 2015-05-25 – 2015-05-29 (×6): 1 via ORAL
  Filled 2015-05-25 (×6): qty 1

## 2015-05-25 MED ORDER — SODIUM CHLORIDE 0.9 % IJ SOLN
Freq: Once | INTRAMUSCULAR | Status: AC
  Administered 2015-05-25: 16:00:00 via INTRAPLEURAL
  Filled 2015-05-25: qty 10

## 2015-05-25 MED ORDER — ENOXAPARIN SODIUM 40 MG/0.4ML ~~LOC~~ SOLN
40.0000 mg | SUBCUTANEOUS | Status: DC
  Administered 2015-05-25: 40 mg via SUBCUTANEOUS
  Filled 2015-05-25: qty 0.4

## 2015-05-25 NOTE — Progress Notes (Signed)
Chest tube unclamped at 1930 per Dr. Genevive Bi. Immediate output of serosanguineous fluid. Pt tolerating well. Will continue to assess.

## 2015-05-25 NOTE — Plan of Care (Addendum)
-  VSS, afebrile. Oxygen saturations stable on 4L.  -IVF infusing. IV Abx given.  -Chest tube in place, minimal drainage overnight.  -CIWA 1  Problem: Safety: Goal: Ability to remain free from injury will improve Outcome: Progressing High fall risk. Bed alarm on. Safe environment provided. Pt understands how to use call button for assistance.   Problem: Pain Managment: Goal: General experience of comfort will improve Outcome: Progressing No c/o pain, resting comfortably through the night

## 2015-05-25 NOTE — Progress Notes (Signed)
Lucas Newman Inpatient Post-Op Note  Patient ID: Lucas Newman, male   DOB: 30-Aug-1919, 82 y.o.   MRN: [id number]  HISTORY: This patient underwent a percutaneous pigtail catheter drainage of his right pleural effusion. He states that he had no problems with the procedure. He's not sure if his breathing is any better or not. He hasn't had time to assess that yet. Does not complain of any significant pain but just complains of being tired and weak. He is afebrile.   Filed Vitals:   05/24/15 2106 05/25/15 0529  BP: 127/58 103/53  Pulse: 89 101  Temp: 98.7 F (37.1 C) 97.7 F (36.5 C)  Resp: 20 18     EXAM: Resp: Lungs are clear bilaterally.  No respiratory distress, normal effort. Heart:  Irregular without murmurs  There is no air leak from the chest tube. It has drained about 300 cc of serous fluid. It is not purulent.  ASSESSMENT: I have independently reviewed the patient's CT scan from yesterday. There is an excellently placed drain in the right pleural space. We will obtain a chest x-ray today. If there remains a large amount of pleural fluid we could consider intrapleural thrombolytics.   PLAN:   I have ordered a chest x-ray for today. I will assess the need for intrapleural thrombolytics. The patient was made aware of this possibility and he is agreeable this is required.    Nestor Lewandowsky, MD

## 2015-05-25 NOTE — Progress Notes (Addendum)
Unclamp chest tube at 1930 and place to low suction per Dr Genevive Bi.

## 2015-05-25 NOTE — Progress Notes (Signed)
Dr Bridgett Larsson percocet 5/325mg  PO every 6 hrs PRN

## 2015-05-25 NOTE — Progress Notes (Signed)
Patient ID: NIKOLOS CORKILL, male   DOB: [birth year], 82 y.o.   MRN: [id number] [facility] PROGRESS NOTE  MIKIEL BERGMAN [id number] DOB: 12-16-1919 DOA: 05/10/2015 PCP: Lavera Guise, MD  HPI/Subjective: The pt complained of cough and shortness of breath, on O2  4L.  S/p percutaneous pigtail catheter drainage of his right pleural effusion. Bloody drainage almost 400 mL.   Objective: Filed Vitals:   05/25/15 0529 05/25/15 1319  BP: 103/53 112/72  Pulse: 101 85  Temp: 97.7 F (36.5 C) 98.2 F (36.8 C)  Resp: 18 20    Filed Weights   05/07/2015 1521 05/16/2015 2022  Weight: 79.379 kg (175 lb) 83.371 kg (183 lb 12.8 oz)    ROS: Review of Systems  Constitutional: Negative for fever and chills.  Respiratory: Positive for cough and shortness of breath. Negative for wheezing.   Cardiovascular: Negative for chest pain.  Gastrointestinal: Negative for nausea, vomiting and diarrhea.  Genitourinary: Negative for dysuria and frequency.  Neurological: Negative for headaches.   Exam: Physical Exam  Constitutional: He is cooperative.  HENT:  Head: Normocephalic.  Nose: No mucosal edema.  Mouth/Throat: No oropharyngeal exudate or posterior oropharyngeal edema.  Throat dry  Eyes: Conjunctivae and lids are normal. Pupils are equal, round, and reactive to light.  Neck: No JVD present. Carotid bruit is not present. No edema present. No thyroid mass and no thyromegaly present.  Cardiovascular: S1 normal and S2 normal.  Exam reveals no gallop.   Murmur heard.  Systolic murmur is present with a grade of 2/6  Pulses:      Dorsalis pedis pulses are 2+ on the right side, and 2+ on the left side.  Respiratory: No respiratory distress. He has decreased breath sounds in the right middle field, the right lower field, the left middle field and the left lower field. He has no wheezes. He has no rhonchi. He has no rales.  Crackles on the right side.  GI: Soft. Bowel sounds are  normal. There is no tenderness.  Musculoskeletal:       Right ankle: He exhibits no swelling.       Left ankle: He exhibits no swelling.  Lymphadenopathy:    He has no cervical adenopathy.  Neurological: He is alert.  Skin: Skin is warm. Lesion and rash noted. Nails show no clubbing.  Bruising seen on arms    Data Reviewed: Basic Metabolic Panel:  Recent Labs Lab 05/20/2015 1736 05/19/15 0633 05/20/15 0625 05/22/15 0442 05/23/15 0434 05/24/15 0452  NA 138 134*  --  145 145 146*  K 4.4 3.7  --  3.5 3.2* 3.5  CL 106 104  --  108 107 108  CO2 24 22  --  26 31 29   GLUCOSE 137* 144*  --  141* 168* 180*  BUN 31* 30*  --  34* 29* 35*  CREATININE 1.77* 1.79* 1.80* 1.65* 1.49* 1.50*  CALCIUM 7.8* 7.4*  --  9.0 8.6* 8.7*   Liver Function Tests:  Recent Labs Lab 05/10/2015 1736 05/24/15 0452  AST 22 16  ALT 12* 20  ALKPHOS 58 76  BILITOT 1.1 0.6  PROT 6.0* 5.7*  ALBUMIN 2.4* 2.3*    Recent Labs Lab 06/04/2015 1736  LIPASE 18   CBC:  Recent Labs Lab 05/19/15 0633 05/20/15 0625 05/22/15 0442 05/23/15 0434 05/24/15 0452  WBC 43.1* 47.9* 53.6* 35.5* 37.3*  HGB 8.4* 9.3* 9.5* 9.3* 8.8*  HCT 26.8* 30.1* 30.1* 28.9* 27.9*  MCV 92.3 92.8 90.8  92.6 94.7  PLT 165 196 243 209 192   Cardiac Enzymes:  Recent Labs Lab 06/03/2015 1736  TROPONINI <0.03   BNP (last 3 results)  Recent Labs  09/04/14 1200 03/20/15 1553  BNP 172.0* 125.0*      Recent Results (from the past 240 hour(s))  Blood Culture (routine x 2)     Status: None   Collection Time: 05/08/2015  3:35 PM  Result Value Ref Range Status   Specimen Description BLOOD LEFT WRIST  Final   Special Requests   Final    BOTTLES DRAWN AEROBIC AND ANAEROBIC ANA 5CC AERO [place]   Culture NO GROWTH 5 DAYS  Final   Report Status 05/23/2015 FINAL  Final  Blood Culture (routine x 2)     Status: None   Collection Time: 05/30/2015  3:36 PM  Result Value Ref Range Status   Specimen Description BLOOD RIGHT HAND  Final    Special Requests   Final    BOTTLES DRAWN AEROBIC AND ANAEROBIC ANA 2CC AERO 3CC   Culture NO GROWTH 5 DAYS  Final   Report Status 05/23/2015 FINAL  Final  Urine culture     Status: None   Collection Time: 06/02/2015  8:52 PM  Result Value Ref Range Status   Specimen Description URINE, RANDOM  Final   Special Requests NONE  Final   Culture INSIGNIFICANT GROWTH  Final   Report Status 05/20/2015 FINAL  Final  Body fluid culture     Status: None (Preliminary result)   Collection Time: 05/22/15  3:55 PM  Result Value Ref Range Status   Specimen Description PLEURAL  Final   Special Requests Normal  Final   Gram Stain FEW WBC SEEN NO ORGANISMS SEEN   Final   Culture NO GROWTH 3 DAYS  Final   Report Status PENDING  Incomplete     Studies: Dg Chest Port 1 View  05/25/2015  CLINICAL DATA:  Chest tube. EXAM: PORTABLE CHEST 1 VIEW COMPARISON:  CT 05/22/2015.  Chest x-ray 05/20/2015. FINDINGS: Right chest tube is noted. Decrease in right pleural effusion. No pneumothorax. Low lung volumes mild basilar atelectasis. Stable cardiomegaly. IMPRESSION: 1. Right chest tube noted. Decrease in size of right pleural effusion. No pneumothorax. 2. Stable cardiomegaly . Electronically Signed   By: Marcello Moores  Register   On: 05/25/2015 11:11   Ct Image Guided Drainage By Percutaneous Catheter  05/25/2015  CLINICAL DATA:  82 year old male with clinical concern for right-sided empyema. CT cattle pleural drainage catheter requested by cardiothoracic surgery. EXAM: CT IMAGE GUIDED DRAINAGE BY PERCUTANEOUS CATHETER Date: 05/25/2015 PROCEDURE: CT-guided biopsy right-sided chest tube Interventional Radiologist:  Criselda Peaches, MD ANESTHESIA/SEDATION: 50 mcg fentanyl administered intravenously MEDICATIONS: None additional TECHNIQUE: Informed consent was obtained from the patient following explanation of the procedure, risks, benefits and alternatives. The patient understands, agrees and consents for the procedure. All  questions were addressed. A time out was performed. A planning axial CT scan was performed. The loculated right pleural effusion was successfully identified. A suitable skin entry site was selected and marked. The region was then sterilely prepped and draped in standard fashion using chlorhexidine skin prep. Local anesthesia was attained by infiltration with 1% lidocaine. A small dermatotomy was made. Under intermittent CT fluoroscopic guidance, an 18 gauge trocar needle was advanced into the right pleural effusion. A short Amplatz wire was then advanced through the needle. The skin tract was dilated and a 12 [place] cook all-purpose drainage catheter modified with additional sideholes was advanced  over the wire and formed. The tube was connected to low wall suction via a pleura vac. Post drain placement imaging demonstrates the drain well-positioned in the posterior and inferior aspect of the lung. There is been significant interval decrease in the volume of the pleural fluid. No evidence of pneumothorax or other complication. The drain was secured in place with Prolene suture and an adhesive fixation device. An air tight bandage was then applied. The patient tolerated the procedure well. COMPLICATIONS: None Estimated blood loss:  0 IMPRESSION: Placement of a 12 French percutaneous drainage catheter into the loculated right-sided pleural effusion with aspiration of approximately 200 mL yellow pleural fluid. Signed, Criselda Peaches, MD [facility] Electronically Signed   By: Jacqulynn Cadet M.D.   On: 05/25/2015 15:35    Scheduled Meds: . budesonide (PULMICORT) nebulizer solution  0.25 mg Nebulization BID  . carvedilol  6.25 mg Oral BID WC  . dipyridamole-aspirin  1 capsule Oral BID  . docusate sodium  100 mg Oral BID  . feeding supplement (ENSURE ENLIVE)  237 mL Oral TID WC  . finasteride  5 mg Oral Daily  . loratadine  10 mg Oral Daily  .  magnesium oxide  400 mg Oral QHS  . multivitamin with minerals  1 tablet Oral Daily  . pantoprazole  40 mg Oral Daily  . piperacillin-tazobactam (ZOSYN)  IV  3.375 g Intravenous 3 times per day  . QUEtiapine  25 mg Oral QHS  . senna-docusate  1 tablet Oral QHS  . tamsulosin  0.4 mg Oral QHS  . thiamine  100 mg Oral Daily  . vancomycin  750 mg Intravenous Q36H   Assessment/Plan:  1. Acute encephalopathy - could be secondary to acute delirium with not sleeping for a few nights, combined with acute respiratory failure and sepsis with pneumonia. Improved. On Seroquel 25 mg at night and When necessary Haldol. Try to avoid Ativan  2. Acute respiratory failure with hypoxia. On Oxygen [place] 4L.  3. Clinical sepsis present on admission with pneumonia, tachycardia and leukocytosis. Continue Vancomycin and Zosyn for 8 days per Dr. Ola Spurr, finished Zithromax. S/p thoracentesis for right pleural effusioin. WBC 2175. LD 214. Per pulmonary physician, Dr. Humphrey Rolls, Dr. Genevive Bi consult for drainage. Dr. Genevive Bi suggested This patient most likely has a loculated parapneumonic effusion. Need percutaneous drain placed. S/p percutaneous pigtail catheter drainage of his right pleural effusion. Bloody drainage almost 400 mL. 4. Atrial fibrillation with rapid ventricular response, rate controlled,  on Coreg 5. CMML- white count always high. Per Dr. Rogue Bussing, Oncology consult, with regards to hospice; is reasonable- especially in the context of his multiple comorbidities/CMML is incurable/frequent admissions the hospital/declining performance status.  6. Hyperlipidemia unspecified on simvastatin 7. BPH on Flomax and finasteride 8. Gastroesophageal reflux disease on Protonix 9. Rash, not Shingles, per Dr. Ola Spurr, discontinued Valtrex 10. Chronic kidney disease stage III   Called his son at [phone], line was always busy.  Per Dr. Megan Salon, patient needs to be placed in skilled nursing facility with palliative  care there, then may go home with hospice care.  Greater than 50% time was spent on coordination of care and face-to-face counseling.  Code Status:  DNR    Disposition Plan: PT.  Antibiotics:  Vancomycin  Zosyn  Zithromax  Time spent: 42 minutes  Demetrios Loll  [facility]

## 2015-05-25 NOTE — Progress Notes (Signed)
ANTIBIOTIC CONSULT NOTE - Follow UP  Pharmacy Consult for Vancomycin/Zosyn (Day 6/8) Indication: pneumonia  No Known Allergies  Patient Measurements: Height: 5\' 6"  (167.6 cm) Weight: 183 lb 12.8 oz (83.371 kg) IBW/kg (Calculated) : 63.8 Adjusted Body Weight: 71.6 kg  Vital Signs: Temp: 97.7 F (36.5 C) (01/19 0529) Temp Source: Oral (01/19 0529) BP: 103/53 mmHg (01/19 0529) Pulse Rate: 101 (01/19 0529) Intake/Output from previous day: 01/18 0701 - 01/19 0700 In: 600 [P.O.:600] Out: 50 [Chest Tube:50] Intake/Output from this shift: Total I/O In: 240 [P.O.:240] Out: -   Labs:  Recent Labs  05/23/15 0434 05/24/15 0452  WBC 35.5* 37.3*  HGB 9.3* 8.8*  PLT 209 192  CREATININE 1.49* 1.50*   Estimated Creatinine Clearance: 31.2 mL/min (by C-G formula based on Cr of 1.5).  Recent Labs  05/23/15 0849  [place] 23*     Microbiology: Recent Results (from the past 720 hour(s))  Blood Culture (routine x 2)     Status: None   Collection Time: 05/30/2015  3:35 PM  Result Value Ref Range Status   Specimen Description BLOOD LEFT WRIST  Final   Special Requests   Final    BOTTLES DRAWN AEROBIC AND ANAEROBIC ANA 5CC AERO [place]   Culture NO GROWTH 5 DAYS  Final   Report Status 05/23/2015 FINAL  Final  Blood Culture (routine x 2)     Status: None   Collection Time: 05/20/2015  3:36 PM  Result Value Ref Range Status   Specimen Description BLOOD RIGHT HAND  Final   Special Requests   Final    BOTTLES DRAWN AEROBIC AND ANAEROBIC ANA 2CC AERO 3CC   Culture NO GROWTH 5 DAYS  Final   Report Status 05/23/2015 FINAL  Final  Urine culture     Status: None   Collection Time: 05/25/2015  8:52 PM  Result Value Ref Range Status   Specimen Description URINE, RANDOM  Final   Special Requests NONE  Final   Culture INSIGNIFICANT GROWTH  Final   Report Status 05/20/2015 FINAL  Final  Body fluid culture     Status: None (Preliminary result)   Collection Time: 05/22/15  3:55 PM  Result  Value Ref Range Status   Specimen Description PLEURAL  Final   Special Requests Normal  Final   Gram Stain FEW WBC SEEN NO ORGANISMS SEEN   Final   Culture NO GROWTH 3 DAYS  Final   Report Status PENDING  Incomplete    Medical History: Past Medical History  Diagnosis Date  . Hyperlipidemia   . GERD (gastroesophageal reflux disease)   . Arthritis   . CVA (cerebral infarction)   . CML (chronic myelocytic leukemia) (HCC)     Medications:  Scheduled:  . budesonide (PULMICORT) nebulizer solution  0.25 mg Nebulization BID  . carvedilol  6.25 mg Oral BID WC  . dipyridamole-aspirin  1 capsule Oral BID  . docusate sodium  100 mg Oral BID  . feeding supplement (ENSURE ENLIVE)  237 mL Oral TID WC  . finasteride  5 mg Oral Daily  . loratadine  10 mg Oral Daily  . magnesium oxide  400 mg Oral QHS  . multivitamin with minerals  1 tablet Oral Daily  . pantoprazole  40 mg Oral Daily  . piperacillin-tazobactam (ZOSYN)  IV  3.375 g Intravenous 3 times per day  . QUEtiapine  25 mg Oral QHS  . senna-docusate  1 tablet Oral QHS  . tamsulosin  0.4 mg Oral QHS  .  thiamine  100 mg Oral Daily  . vancomycin  750 mg Intravenous Q36H   Infusions:  . dextrose 5 % and 0.9% NaCl 40 mL/hr at 05/24/15 1848   Assessment:   Pharmacy consulted to dose vancomycin and Zosyn for PNA. Patient is currently on day 6/8 of Zosyn 3.375g IV Q8hr and vancomycin 750mg  IV Q24hr.    Goal of Therapy:  Vancomycin trough level 15-20 mcg/ml  Plan:  Will continue Zosyn 3.375g IV Q8hr. Trough elevated at 23, will transition to vancomycin 750mg  IV Q36hr. Will continue to follow renal function. Will not plan for additional trough unless otherwise clinically indicated.  Will order SCr in AM for monitoring purposes.   Pharmacy will continue to monitor and adjust per consult.     Bryden Darden G 05/25/2015,12:48 PM

## 2015-05-25 NOTE — Evaluation (Signed)
Physical Therapy Evaluation Patient Details Name: RAUNEL UEHARA MRN: [id number] DOB: 1920-02-05 Today's Date: 05/25/2015   History of Present Illness  Pt is admitted for pneumonia with complaints of weakness. Pt with history of shingles and chronic myelomoncyctic leukemia. Pt now with current chest tube in place secondary to drainage of R pleural effusion on 05/24/15. Pt with recent falls and was recently discharged on 11/22 to rehab.  Clinical Impression  Pt is a pleasant 82 year old male who was admitted for pneumonia. Pt performs transfers and ambulation with rw and min assist. Additional assist required for management of lines/leads/tubes. Pt fatigues quickly and reports he feels weaker than normal and is unable to ambulate further secondary to pain. RN entered room with pain meds.  Pt demonstrates deficits with strength/endurance/mobility/pain. Pt reports he has been to SNF before and is refusing to go back as he feels he has sufficient help at home. CM notified. From therapy perspective, pt motivated and would benefit from SNF stay to address mobility goals and improve to PLOF as patient is not at baseline level. Would benefit from skilled PT to address above deficits and promote optimal return to PLOF      Follow Up Recommendations SNF    Equipment Recommendations       Recommendations for Other Services       Precautions / Restrictions Precautions Precautions: Fall Restrictions Weight Bearing Restrictions: No      Mobility  Bed Mobility               General bed mobility comments: Pt received sitting at EOB upon arrival  Transfers Overall transfer level: Needs assistance Equipment used: Rolling walker (2 wheeled) Transfers: Sit to/from Stand Sit to Stand: Min assist         General transfer comment: assist required as well as momentum to perform transfers. Once standing, pt able to stand with cga  Ambulation/Gait Ambulation/Gait assistance: Min  assist Ambulation Distance (Feet): 5 Feet Assistive device: Rolling walker (2 wheeled) Gait Pattern/deviations: Step-to pattern     General Gait Details: ambulated with rw with additional assist for management of lines/leads/tubes. O2 remained on pt during ambulation. Slow step to gait pattern. Pt with SOB symptoms once seated. O2 sats remained at 94% while on 3.5L of O2.  Stairs            Wheelchair Mobility    Modified Rankin (Stroke Patients Only)       Balance Overall balance assessment: History of Falls;Needs assistance Sitting-balance support: Feet supported Sitting balance-Leahy Scale: Good     Standing balance support: Bilateral upper extremity supported Standing balance-Leahy Scale: Fair                               Pertinent Vitals/Pain Pain Assessment: Faces Faces Pain Scale: Hurts even more Pain Location: R chest Pain Descriptors / Indicators: Operative site guarding Pain Intervention(s): Limited activity within patient's tolerance    Home Living Family/patient expects to be discharged to:: Private residence Living Arrangements: Spouse/significant other Available Help at Discharge: Available PRN/intermittently;Personal care attendant Type of Home: House Home Access: Stairs to enter   [employer] of Steps: 3 steps to enter (B rail front stairs; 1 rail in garage) Home Layout: Able to live on main level with bedroom/bathroom Home Equipment: Walker - 4 wheels;Walker - 2 wheels;Tub bench;Hand held shower head      Prior Function Level of Independence: Independent with assistive device(s);Needs  assistance         Comments: Pt was ambulating household distances with RW or rollator. Pt receives assist from caregivers during the day for ADLs.      Hand Dominance        Extremity/Trunk Assessment   Upper Extremity Assessment: Generalized weakness (grossly 3+/5)           Lower Extremity Assessment: Generalized  weakness (grossly 4/5)         Communication   Communication: No difficulties  Cognition Arousal/Alertness: Awake/alert Behavior During Therapy: WFL for tasks assessed/performed Overall Cognitive Status: Within Functional Limits for tasks assessed                      General Comments      Exercises Other Exercises Other Exercises: Pt performed seated ther-ex including B LE including LAQ, hip abd/add, SLRs, SAQ, and hip abd/add. All ther-ex performed x 10 reps with min assist for correct technique.      Assessment/Plan    PT Assessment Patient needs continued PT services  PT Diagnosis Difficulty walking;Generalized weakness;Acute pain   PT Problem List Decreased strength;Decreased balance;Decreased mobility;Decreased safety awareness;Pain  PT Treatment Interventions Gait training;Therapeutic exercise   PT Goals (Current goals can be found in the Care Plan section) Acute Rehab PT Goals Patient Stated Goal: to go home PT Goal Formulation: With patient Time For Goal Achievement: 06/08/15 Potential to Achieve Goals: Fair    Frequency Min 2X/week   Barriers to discharge Decreased caregiver support      Co-evaluation               End of Session Equipment Utilized During Treatment: Gait belt;Oxygen Activity Tolerance: Patient limited by fatigue Patient left: in chair;with chair alarm set Nurse Communication: Mobility status         Time: 1143-1200 PT Time Calculation (min) (ACUTE ONLY): 17 min   Charges:   PT Evaluation $PT Eval Moderate Complexity: 1 Procedure PT Treatments $Therapeutic Exercise: 8-22 mins   PT G Codes:        Pius Byrom May 28, 2015, 3:13 PM  Greggory Stallion, PT, DPT [phone]

## 2015-05-26 ENCOUNTER — Inpatient Hospital Stay: Payer: Medicare Other

## 2015-05-26 LAB — BODY FLUID CULTURE
CULTURE: NO GROWTH
SPECIAL REQUESTS: NORMAL

## 2015-05-26 LAB — CREATININE, SERUM
Creatinine, Ser: 1.83 mg/dL — ABNORMAL HIGH (ref 0.61–1.24)
GFR calc Af Amer: 35 mL/min — ABNORMAL LOW (ref >60)
GFR, EST NON AFRICAN AMERICAN: 30 mL/min — AB (ref >60)

## 2015-05-26 MED ORDER — SODIUM CHLORIDE 0.9 % IV SOLN
INTRAVENOUS | Status: AC
  Administered 2015-05-26 – 2015-05-27 (×2): via INTRAVENOUS

## 2015-05-26 MED ORDER — SODIUM CHLORIDE 0.9 % IV BOLUS (SEPSIS)
250.0000 mL | Freq: Once | INTRAVENOUS | Status: AC
  Administered 2015-05-26: 17:00:00 250 mL via INTRAVENOUS

## 2015-05-26 MED ORDER — ENOXAPARIN SODIUM 30 MG/0.3ML ~~LOC~~ SOLN
30.0000 mg | SUBCUTANEOUS | Status: DC
Start: 2015-05-26 — End: 2015-05-29
  Administered 2015-05-26 – 2015-05-28 (×3): 30 mg via SUBCUTANEOUS
  Filled 2015-05-26 (×3): qty 0.3

## 2015-05-26 MED ORDER — ENSURE ENLIVE PO LIQD: 237.0000 mL | ORAL | Status: DC

## 2015-05-26 MED ORDER — SODIUM CHLORIDE 0.9 % IV BOLUS (SEPSIS)
500.0000 mL | Freq: Once | INTRAVENOUS | Status: AC
  Administered 2015-05-26: 500 mL via INTRAVENOUS

## 2015-05-26 NOTE — Progress Notes (Signed)
Palliative Care follow-up- Patient continues to have chest tube and will remain hospitalized until drainage is 200cc or less daily per Dr. Genevive Bi. It is still undetermined what patient is willing to do upon discharge regarding SNF for rehab vs. Home with Hospice care Upon last discussion with patient, he refused SNF for rehab and family is conflicted about having him return home due to current level of care. Due to patient's varying levels of agitation/confusion, care team decided to approach further discussion with patient at a later time. Will continue to follow.  Atha Starks, MSW, LCSW Palliative Care Social Worker [phone]

## 2015-05-26 NOTE — Plan of Care (Signed)
-  VSS, afebrile. Oxygen saturations stable on 4L.  -IVF infusing. IV Abx given.  -Chest tube in place, 764mL output overnight -CIWA 1 -Continued poor PO intake. Refuses protein drinks r/t "so many bowel movements" per the pt.  Problem: Safety: Goal: Ability to remain free from injury will improve Outcome: Progressing High fall risk. Bed alarm on. Safe environment provided. Pt understands how to use call button for assistance.   Problem: Pain Managment: Goal: General experience of comfort will improve Outcome: Progressing No c/o pain, resting comfortably through the night

## 2015-05-26 NOTE — Progress Notes (Signed)
Patient with low blood pressue, spoke to Dr Jannifer Franklin order for 500 bolus over 2 hours

## 2015-05-26 NOTE — Progress Notes (Addendum)
ANTIBIOTIC CONSULT NOTE - Follow UP  Pharmacy Consult for Vancomycin/Zosyn (Day 7/8) Indication: pneumonia  No Known Allergies  Patient Measurements: Height: 5\' 6"  (167.6 cm) Weight: 183 lb 12.8 oz (83.371 kg) IBW/kg (Calculated) : 63.8 Adjusted Body Weight: 71.6 kg  Vital Signs: Temp: 97.9 F (36.6 C) (01/20 0540) Temp Source: Oral (01/20 0540) BP: 102/65 mmHg (01/20 0957) Pulse Rate: 99 (01/20 0953) Intake/Output from previous day: 01/19 0701 - 01/20 0700 In: 1683 [P.O.:720; I.V.:963] Out: 925 [Chest Tube:925] Intake/Output from this shift:    Labs:  Recent Labs  05/24/15 0452 05/26/15 0634  WBC 37.3*  --   HGB 8.8*  --   PLT 192  --   CREATININE 1.50* 1.83*   Estimated Creatinine Clearance: 25.5 mL/min (by C-G formula based on Cr of 1.83). No results for input(s): VANCOTROUGH, VANCOPEAK, VANCORANDOM, GENTTROUGH, GENTPEAK, GENTRANDOM, TOBRATROUGH, TOBRAPEAK, TOBRARND, AMIKACINPEAK, AMIKACINTROU, AMIKACIN in the last 72 hours.   Microbiology: Recent Results (from the past 720 hour(s))  Blood Culture (routine x 2)     Status: None   Collection Time: 05/07/2015  3:35 PM  Result Value Ref Range Status   Specimen Description BLOOD LEFT WRIST  Final   Special Requests   Final    BOTTLES DRAWN AEROBIC AND ANAEROBIC ANA 5CC AERO [place]   Culture NO GROWTH 5 DAYS  Final   Report Status 05/23/2015 FINAL  Final  Blood Culture (routine x 2)     Status: None   Collection Time: 05/19/2015  3:36 PM  Result Value Ref Range Status   Specimen Description BLOOD RIGHT HAND  Final   Special Requests   Final    BOTTLES DRAWN AEROBIC AND ANAEROBIC ANA 2CC AERO 3CC   Culture NO GROWTH 5 DAYS  Final   Report Status 05/23/2015 FINAL  Final  Urine culture     Status: None   Collection Time: 05/26/2015  8:52 PM  Result Value Ref Range Status   Specimen Description URINE, RANDOM  Final   Special Requests NONE  Final   Culture INSIGNIFICANT GROWTH  Final   Report Status 05/20/2015 FINAL   Final  Body fluid culture     Status: None (Preliminary result)   Collection Time: 05/22/15  3:55 PM  Result Value Ref Range Status   Specimen Description PLEURAL  Final   Special Requests Normal  Final   Gram Stain FEW WBC SEEN NO ORGANISMS SEEN   Final   Culture NO GROWTH 3 DAYS  Final   Report Status PENDING  Incomplete    Medical History: Past Medical History  Diagnosis Date  . Hyperlipidemia   . GERD (gastroesophageal reflux disease)   . Arthritis   . CVA (cerebral infarction)   . CML (chronic myelocytic leukemia) (HCC)     Medications:  Scheduled:  . budesonide (PULMICORT) nebulizer solution  0.25 mg Nebulization BID  . carvedilol  6.25 mg Oral BID WC  . dipyridamole-aspirin  1 capsule Oral BID  . docusate sodium  100 mg Oral BID  . enoxaparin (LOVENOX) injection  30 mg Subcutaneous Q24H  . feeding supplement (ENSURE ENLIVE)  237 mL Oral TID WC  . finasteride  5 mg Oral Daily  . loratadine  10 mg Oral Daily  . magnesium oxide  400 mg Oral QHS  . multivitamin with minerals  1 tablet Oral Daily  . pantoprazole  40 mg Oral Daily  . piperacillin-tazobactam (ZOSYN)  IV  3.375 g Intravenous 3 times per day  . QUEtiapine  25 mg Oral QHS  . senna-docusate  1 tablet Oral QHS  . tamsulosin  0.4 mg Oral QHS  . thiamine  100 mg Oral Daily  . vancomycin  750 mg Intravenous Q36H   Infusions:  . dextrose 5 % and 0.9% NaCl 40 mL/hr at 05/25/15 1729   Assessment:   Pharmacy consulted to dose vancomycin and Zosyn for PNA. Patient is currently on day 7/8 of Zosyn 3.375g IV Q8hr and vancomycin 750mg  IV Q36 hr.   Goal of Therapy:  Vancomycin trough level 15-20 mcg/ml  Plan:  Will continue Zosyn 3.375g IV Q8hr. Last dose of Vancomycin scheduled to be given tomorrow @ 2000.   Pharmacy will continue to monitor and adjust per consult.     Lynkin Saini D 05/26/2015,10:49 AM

## 2015-05-26 NOTE — Plan of Care (Signed)
Dr. Genevive Bi notified chest tube attaching to pt is stopped up with thick blood, no apparent clots & does not appear to be draining. Dr. Genevive Bi stated he just saw the patient and tube looks fine. Updated Gerald Stabs RN who is now taking over care of patient.

## 2015-05-26 NOTE — Progress Notes (Addendum)
Patient ID: Lucas Newman, male   DOB: 1919/05/27, 82 y.o.   MRN: [id number] [facility] PROGRESS NOTE  Lucas Newman [id number] DOB: [birth year] DOA: 05/26/2015 PCP: Lavera Guise, MD  HPI/Subjective: better cough and shortness of breath, on O2 [place] 4L.  S/p percutaneous pigtail catheter drainage of his right pleural effusion. Bloody drainage about 600 mL.    Objective: Filed Vitals:   05/26/15 0957 05/26/15 1314  BP: 102/65 86/51  Pulse:  88  Temp:  99.1 F (37.3 C)  Resp:  20    Filed Weights   05/15/2015 1521 05/23/2015 2022  Weight: 79.379 kg (175 lb) 83.371 kg (183 lb 12.8 oz)    ROS: Review of Systems  Constitutional: Negative for fever and chills.  Respiratory: Positive for cough and shortness of breath. Negative for wheezing.   Cardiovascular: Negative for chest pain.  Gastrointestinal: Negative for nausea, vomiting and diarrhea.  Genitourinary: Negative for dysuria and frequency.  Neurological: Negative for headaches.   Exam: Physical Exam  Constitutional: He is cooperative.  HENT:  Head: Normocephalic.  Nose: No mucosal edema.  Mouth/Throat: No oropharyngeal exudate or posterior oropharyngeal edema.  Throat dry  Eyes: Conjunctivae and lids are normal. Pupils are equal, round, and reactive to light.  Neck: No JVD present. Carotid bruit is not present. No edema present. No thyroid mass and no thyromegaly present.  Cardiovascular: S1 normal and S2 normal.  Exam reveals no gallop.   Murmur heard.  Systolic murmur is present with a grade of 2/6  Pulses:      Dorsalis pedis pulses are 2+ on the right side, and 2+ on the left side.  Respiratory: No respiratory distress. He has decreased breath sounds in the right middle field, the right lower field, the left middle field and the left lower field. He has no wheezes. He has no rhonchi. He has no rales.  Crackles on the right side.  GI: Soft. Bowel sounds are normal. There is no tenderness.   Musculoskeletal:       Right ankle: He exhibits no swelling.       Left ankle: He exhibits no swelling.  Lymphadenopathy:    He has no cervical adenopathy.  Neurological: He is alert.  Skin: Skin is warm. Lesion and rash noted. Nails show no clubbing.  Bruising seen on arms    Data Reviewed: Basic Metabolic Panel:  Recent Labs Lab 05/20/15 0625 05/22/15 0442 05/23/15 0434 05/24/15 0452 05/26/15 0634  NA  --  145 145 146*  --   K  --  3.5 3.2* 3.5  --   CL  --  108 107 108  --   CO2  --  26 31 29   --   GLUCOSE  --  141* 168* 180*  --   BUN  --  34* 29* 35*  --   CREATININE 1.80* 1.65* 1.49* 1.50* 1.83*  CALCIUM  --  9.0 8.6* 8.7*  --    Liver Function Tests:  Recent Labs Lab 05/24/15 0452  AST 16  ALT 20  ALKPHOS 76  BILITOT 0.6  PROT 5.7*  ALBUMIN 2.3*   No results for input(s): LIPASE, AMYLASE in the last 168 hours. CBC:  Recent Labs Lab 05/20/15 0625 05/22/15 0442 05/23/15 0434 05/24/15 0452  WBC 47.9* 53.6* 35.5* 37.3*  HGB 9.3* 9.5* 9.3* 8.8*  HCT 30.1* 30.1* 28.9* 27.9*  MCV 92.8 90.8 92.6 94.7  PLT 196 243 209 192   Cardiac Enzymes: No results for  input(s): CKTOTAL, CKMB, CKMBINDEX, TROPONINI in the last 168 hours. BNP (last 3 results)  Recent Labs  09/04/14 1200 03/20/15 1553  BNP 172.0* 125.0*      Recent Results (from the past 240 hour(s))  Blood Culture (routine x 2)     Status: None   Collection Time: 05/30/2015  3:35 PM  Result Value Ref Range Status   Specimen Description BLOOD LEFT WRIST  Final   Special Requests   Final    BOTTLES DRAWN AEROBIC AND ANAEROBIC ANA 5CC AERO [place]   Culture NO GROWTH 5 DAYS  Final   Report Status 05/23/2015 FINAL  Final  Blood Culture (routine x 2)     Status: None   Collection Time: 05/23/2015  3:36 PM  Result Value Ref Range Status   Specimen Description BLOOD RIGHT HAND  Final   Special Requests   Final    BOTTLES DRAWN AEROBIC AND ANAEROBIC ANA 2CC AERO 3CC   Culture NO GROWTH 5 DAYS   Final   Report Status 05/23/2015 FINAL  Final  Urine culture     Status: None   Collection Time: 05/08/2015  8:52 PM  Result Value Ref Range Status   Specimen Description URINE, RANDOM  Final   Special Requests NONE  Final   Culture INSIGNIFICANT GROWTH  Final   Report Status 05/20/2015 FINAL  Final  Body fluid culture     Status: None   Collection Time: 05/22/15  3:55 PM  Result Value Ref Range Status   Specimen Description PLEURAL  Final   Special Requests Normal  Final   Gram Stain FEW WBC SEEN NO ORGANISMS SEEN   Final   Culture NO GROWTH 4 DAYS  Final   Report Status 05/26/2015 FINAL  Final     Studies: Dg Chest Port 1 View  05/26/2015  CLINICAL DATA:  Chest tube. EXAM: PORTABLE CHEST 1 VIEW COMPARISON:  05/25/2015. FINDINGS: Right chest tube in stable position. Mediastinum and hilar structures are normal. Persistent low lung volumes with basilar atelectasis. Small pleural effusions are stable. No progressive pleural effusion. No pneumothorax. IMPRESSION: 1. Right chest tube in stable position. No pneumothorax. No evidence of progressive pleural effusion. Small bilateral pleural effusions are present and stable. 2. Stable cardiomegaly. 3. Low lung volumes with basilar atelectasis. Electronically Signed   By: [place]   On: 05/26/2015 07:39   Dg Chest Port 1 View  05/25/2015  CLINICAL DATA:  Chest tube. EXAM: PORTABLE CHEST 1 VIEW COMPARISON:  CT 05/22/2015.  Chest x-ray 05/20/2015. FINDINGS: Right chest tube is noted. Decrease in right pleural effusion. No pneumothorax. Low lung volumes mild basilar atelectasis. Stable cardiomegaly. IMPRESSION: 1. Right chest tube noted. Decrease in size of right pleural effusion. No pneumothorax. 2. Stable cardiomegaly . Electronically Signed   By: Marcello Moores  Register   On: 05/25/2015 11:11   Ct Image Guided Drainage By Percutaneous Catheter  05/25/2015  CLINICAL DATA:  82 year old male with clinical concern for right-sided empyema. CT cattle  pleural drainage catheter requested by cardiothoracic surgery. EXAM: CT IMAGE GUIDED DRAINAGE BY PERCUTANEOUS CATHETER Date: 05/25/2015 PROCEDURE: CT-guided biopsy right-sided chest tube Interventional Radiologist:  Criselda Peaches, MD ANESTHESIA/SEDATION: 50 mcg fentanyl administered intravenously MEDICATIONS: None additional TECHNIQUE: Informed consent was obtained from the patient following explanation of the procedure, risks, benefits and alternatives. The patient understands, agrees and consents for the procedure. All questions were addressed. A time out was performed. A planning axial CT scan was performed. The loculated right pleural effusion was  successfully identified. A suitable skin entry site was selected and marked. The region was then sterilely prepped and draped in standard fashion using chlorhexidine skin prep. Local anesthesia was attained by infiltration with 1% lidocaine. A small dermatotomy was made. Under intermittent CT fluoroscopic guidance, an 18 gauge trocar needle was advanced into the right pleural effusion. A short Amplatz wire was then advanced through the needle. The skin tract was dilated and a 12 [place] cook all-purpose drainage catheter modified with additional sideholes was advanced over the wire and formed. The tube was connected to low wall suction via a pleura vac. Post drain placement imaging demonstrates the drain well-positioned in the posterior and inferior aspect of the lung. There is been significant interval decrease in the volume of the pleural fluid. No evidence of pneumothorax or other complication. The drain was secured in place with Prolene suture and an adhesive fixation device. An air tight bandage was then applied. The patient tolerated the procedure well. COMPLICATIONS: None Estimated blood loss:  0 IMPRESSION: Placement of a 12 French percutaneous drainage catheter into the loculated right-sided pleural effusion with aspiration of approximately 200 mL yellow  pleural fluid. Signed, Criselda Peaches, MD [facility] Electronically Signed   By: Jacqulynn Cadet M.D.   On: 05/25/2015 15:35    Scheduled Meds: . budesonide (PULMICORT) nebulizer solution  0.25 mg Nebulization BID  . carvedilol  6.25 mg Oral BID WC  . dipyridamole-aspirin  1 capsule Oral BID  . docusate sodium  100 mg Oral BID  . enoxaparin (LOVENOX) injection  30 mg Subcutaneous Q24H  . [START ON 05/27/2015] feeding supplement (ENSURE ENLIVE)  237 mL Oral Q24H  . finasteride  5 mg Oral Daily  . loratadine  10 mg Oral Daily  . magnesium oxide  400 mg Oral QHS  . multivitamin with minerals  1 tablet Oral Daily  . pantoprazole  40 mg Oral Daily  . piperacillin-tazobactam (ZOSYN)  IV  3.375 g Intravenous 3 times per day  . QUEtiapine  25 mg Oral QHS  . senna-docusate  1 tablet Oral QHS  . tamsulosin  0.4 mg Oral QHS  . thiamine  100 mg Oral Daily  . vancomycin  750 mg Intravenous Q36H   Assessment/Plan:  1. Acute encephalopathy - could be secondary to acute delirium with not sleeping for a few nights, combined with acute respiratory failure and sepsis with pneumonia. Improved. On Seroquel 25 mg at night and When necessary Haldol. Try to avoid Ativan  2. Acute respiratory failure with hypoxia. On Oxygen  4L.  3. Clinical sepsis present on admission with pneumonia, tachycardia and leukocytosis. Continue Vancomycin and Zosyn for 8 days per Dr. Ola Spurr, finished Zithromax. S/p thoracentesis for right pleural effusioin. WBC 2175. LD 214. Per pulmonary physician, Dr. Humphrey Rolls, Dr. Genevive Bi consult for drainage. Dr. Genevive Bi suggested This patient most likely has a loculated parapneumonic effusion. Need percutaneous drain placed. S/p percutaneous pigtail catheter drainage of his right pleural effusion. Bloody drainage about 1200 mL since placement.. 4. Atrial fibrillation with rapid ventricular response, rate controlled,  on Coreg 5. CMML-  white count always high. Per Dr. Rogue Bussing, Oncology consult, with regards to hospice; is reasonable- especially in the context of his multiple comorbidities/CMML is incurable/frequent admissions the hospital/declining performance status.  6. Hyperlipidemia unspecified on simvastatin 7. BPH on Flomax and finasteride 8. Gastroesophageal reflux disease on Protonix 9. Rash, not Shingles, per Dr. Ola Spurr, discontinued Valtrex 10. Chronic kidney disease stage III  Hypotension.  BP 86/53, given NS iv, still low, give NS bolus.  Watch for VS closely.  Called his son at [phone], line was always busy.  Per Dr. Megan Salon, patient needs to be placed in skilled nursing facility with palliative care there, then may go home with hospice care.  Greater than 50% time was spent on coordination of care and face-to-face counseling.  Code Status:  DNR    Disposition Plan: PT.  Antibiotics:  Vancomycin  Zosyn  Zithromax  Critical Time spent: 46 minutes  Demetrios Loll  [facility]

## 2015-05-26 NOTE — Progress Notes (Signed)
Nutrition Follow-up   INTERVENTION:   Meals and Snacks: Cater to patient preferences Medical Food Supplement Therapy: Will add Magic Cup BID for added nutrition as per Nsg pt not tolerating Ensure. Will decrease Ensure, however leave daily as pt with opened Ensure at bedside this am.  Coordination of Care: will recommend collecting new weight   NUTRITION DIAGNOSIS:   Inadequate oral intake related to acute illness as evidenced by  (dietitian consult and pt NPO this am for procedure).  GOAL:   Patient will meet greater than or equal to 90% of their needs; ongoing  MONITOR:    (Energy Intake, Electrolyte and renal Profile, Anthropometrics, Digestive System)  REASON FOR ASSESSMENT:   Consult Poor PO  ASSESSMENT:   Pt admitted with pna and weakness. Pt scheduled for thoracentesis 05/22/2015. Pt on Airborne Isolation for shingles and currently on CIWA.  Pt s/p thoracentesis for right pleural effusion and chest tube placement. Pt with 423mL bloody drainage recorded.   Pt asleep in chair on visit. Tray had been picked up but Ensure and tea remained on tray table.  Diet Order:  Diet regular Room service appropriate?: Yes; Fluid consistency:: Thin    Current Nutrition: Pt with <25% of meals eaten yesterday recorded, 100% of one breakfast on 05/24/2015, otherwise bites of meals. Per Nsg note, pt not tolerating Ensure.    Gastrointestinal Profile: Last BM: 05/25/2015   Scheduled Medications:  . budesonide (PULMICORT) nebulizer solution  0.25 mg Nebulization BID  . carvedilol  6.25 mg Oral BID WC  . dipyridamole-aspirin  1 capsule Oral BID  . docusate sodium  100 mg Oral BID  . enoxaparin (LOVENOX) injection  30 mg Subcutaneous Q24H  . feeding supplement (ENSURE ENLIVE)  237 mL Oral TID WC  . finasteride  5 mg Oral Daily  . loratadine  10 mg Oral Daily  . magnesium oxide  400 mg Oral QHS  . multivitamin with minerals  1 tablet Oral Daily  . pantoprazole  40 mg Oral Daily  .  piperacillin-tazobactam (ZOSYN)  IV  3.375 g Intravenous 3 times per day  . QUEtiapine  25 mg Oral QHS  . senna-docusate  1 tablet Oral QHS  . tamsulosin  0.4 mg Oral QHS  . thiamine  100 mg Oral Daily  . vancomycin  750 mg Intravenous Q36H    Continuous Medications:  . dextrose 5 % and 0.9% NaCl 40 mL/hr at 05/25/15 1729   D5 providing 163kcals in 24 hours   Electrolyte/Renal Profile and Glucose Profile:   Recent Labs Lab 05/22/15 0442 05/23/15 0434 05/24/15 0452 05/26/15 0634  NA 145 145 146*  --   K 3.5 3.2* 3.5  --   CL 108 107 108  --   CO2 26 31 29   --   BUN 34* 29* 35*  --   CREATININE 1.65* 1.49* 1.50* 1.83*  CALCIUM 9.0 8.6* 8.7*  --   GLUCOSE 141* 168* 180*  --    Protein Profile:  Recent Labs Lab 05/24/15 0452  ALBUMIN 2.3*     Nutrition-Focused Physical Exam Findings:  Unable to complete Nutrition-Focused physical exam at this time.    Weight Trend since Admission: Filed Weights   05/21/2015 1521 05/31/2015 2022  Weight: 175 lb (79.379 kg) 183 lb 12.8 oz (83.371 kg)     BMI:  Body mass index is 29.68 kg/(m^2).  Estimated Nutritional Needs:   Kcal:  BEE: 1412kcals, TEE: (IF 1.0-1.2)(AF 1.2) [id number]  Protein:  83-99g protein (1.0-1.2g/kg)  Fluid:  2075-2460mL of fluid (25-9mL/kg)  EDUCATION NEEDS:   No education needs identified at this time   [place], RD, LDN Pager [phone] Weekend/On-Call Pager [phone]

## 2015-05-26 NOTE — Progress Notes (Signed)
Enoxaparin Dosing Adjustment  Patient's CrCl resulted @ 25 ml/min. Dose adjusted to Enoxaparin 30 mg SQ daily for CrCl <30 ml/min.  Larene Beach, PharmD

## 2015-05-26 NOTE — Care Management Important Message (Signed)
Important Message  Patient Details  Name: DIRCK KEVORKIAN MRN: [id number] Date of Birth: 1919/10/15   Medicare Important Message Given:  Yes    [name] A Delquan Poucher 05/26/2015, 10:08 AM

## 2015-05-26 NOTE — Progress Notes (Signed)
Lucas Newman Inpatient Post-Op Note  Patient ID: Lucas Newman, male   DOB: 03-17-1920, 82 y.o.   MRN: [id number]  HISTORY: This 82 year old gentleman underwent intrapleural TPA administration yesterday. The tube was clamped for 4 hours and subsequently drained about 800 cc of serous to serosanguineous fluid. There is no air leak today. He states that he feels better. He states that his breathing is improved and he has no pain. He is resting comfortably.   Filed Vitals:   05/26/15 0540 05/26/15 0604  BP: 92/49 108/56  Pulse: 103 110  Temp: 97.9 F (36.6 C)   Resp: 18      EXAM: Resp: Lungs are clear bilaterally.  No respiratory distress, normal effort. Heart:  Irregularly irregular without murmurs Abd:  Abdomen is soft, non distended and non tender. No masses are palpable.  There is no rebound and no guarding.   There is no air leak. I milked the tubes and there is no additional fluid present. He did have a chest x-ray made this morning which shows complete resolution of his pleural effusion.   ASSESSMENT: Right sided pleural effusion. There is been complete clearing with the administration of intrapleural thrombolytics.   PLAN:   I would leave the chest tube in place until the drainage is less than 200 cc per day. I would monitor clinical exams and periodic chest x-rays to assess for reaccumulation of fluid. No plan for any surgical intervention or additional administration of intrapleural thrombolytics.    Nestor Lewandowsky, MD

## 2015-05-26 NOTE — Progress Notes (Signed)
[facility] INFECTIOUS DISEASE PROGRESS NOTE Date of Admission:  05/07/2015     ID: Lucas Newman is a 82 y.o. male with   Principal Problem:   Pneumonia Active Problems:   Shingles   Chronic myelomonocytic leukemia, in relapse (HCC)   Pleural effusion   Subjective: Had a pigtail placed. Not much improvement. Still with deep cough  ROS  Eleven systems are reviewed and negative except per hpi  Medications:  Antibiotics Given (last 72 hours)    Date/Time Action Medication Dose Rate   05/23/15 2104 Given   piperacillin-tazobactam (ZOSYN) IVPB 3.375 g 3.375 g 12.5 mL/hr   05/24/15 0521 Given   piperacillin-tazobactam (ZOSYN) IVPB 3.375 g 3.375 g 12.5 mL/hr   05/24/15 [id number] Given   valACYclovir (VALTREX) tablet 1,000 mg 1,000 mg    05/24/15 1448 Given   piperacillin-tazobactam (ZOSYN) IVPB 3.375 g 3.375 g 12.5 mL/hr   05/24/15 2102 Given   piperacillin-tazobactam (ZOSYN) IVPB 3.375 g 3.375 g 12.5 mL/hr   05/24/15 2102 Given   vancomycin (VANCOCIN) IVPB 750 mg/150 ml premix 750 mg 150 mL/hr   05/25/15 0558 Given   piperacillin-tazobactam (ZOSYN) IVPB 3.375 g 3.375 g 12.5 mL/hr   05/25/15 1459 Given   piperacillin-tazobactam (ZOSYN) IVPB 3.375 g 3.375 g 12.5 mL/hr   05/25/15 2048 Given   piperacillin-tazobactam (ZOSYN) IVPB 3.375 g 3.375 g 12.5 mL/hr   05/26/15 0605 Given   piperacillin-tazobactam (ZOSYN) IVPB 3.375 g 3.375 g 12.5 mL/hr   05/26/15 1007 Given   vancomycin (VANCOCIN) IVPB 750 mg/150 ml premix 750 mg 150 mL/hr     . budesonide (PULMICORT) nebulizer solution  0.25 mg Nebulization BID  . carvedilol  6.25 mg Oral BID WC  . dipyridamole-aspirin  1 capsule Oral BID  . docusate sodium  100 mg Oral BID  . enoxaparin (LOVENOX) injection  30 mg Subcutaneous Q24H  . [START ON 05/27/2015] feeding supplement (ENSURE ENLIVE)  237 mL Oral Q24H  . finasteride  5 mg Oral Daily  . loratadine  10 mg Oral Daily  . magnesium oxide  400 mg Oral QHS  . multivitamin with  minerals  1 tablet Oral Daily  . pantoprazole  40 mg Oral Daily  . piperacillin-tazobactam (ZOSYN)  IV  3.375 g Intravenous 3 times per day  . QUEtiapine  25 mg Oral QHS  . senna-docusate  1 tablet Oral QHS  . tamsulosin  0.4 mg Oral QHS  . thiamine  100 mg Oral Daily  . vancomycin  750 mg Intravenous Q36H    Objective: Vital signs in last 24 hours: Temp:  [97.9 F (36.6 C)-99.1 F (37.3 C)] 99.1 F (37.3 C) (01/20 1314) Pulse Rate:  [88-103] 88 (01/20 1314) Resp:  [18-20] 20 (01/20 1314) BP: (86-110)/(49-65) 86/51 mmHg (01/20 1314) SpO2:  [97 %-100 %] 97 % (01/20 1314) Constitutional: He is oriented to person, place, and time. HOH, frail HENT:  Mouth/Throat: Oropharynx is clear and dry. No oropharyngeal exudate.  Cardiovascular: Normal rate, regular rhythm  Pulmonary/Chest: bibasilar rhonchi poor ar movement.  Abdominal: Soft. Bowel sounds are normal. He exhibits some distension. There is no tenderness.  Lymphadenopathy:  He has no cervical adenopathy.  Neurological: He is alert and oriented to person, place, and time.  Skin: Skin is warm and dry. Mild erythema R flank, vesicle on abd wall  Psychiatric: He has a normal mood and affect. His behavior is normal.   Lab Results  Recent Labs  05/24/15 0452 05/26/15 0634  WBC 37.3*  --  HGB 8.8*  --   HCT 27.9*  --   NA 146*  --   K 3.5  --   CL 108  --   CO2 29  --   BUN 35*  --   CREATININE 1.50* 1.83*    Microbiology: Results for orders placed or performed during the hospital encounter of 06/01/2015  Blood Culture (routine x 2)     Status: None   Collection Time: 05/09/2015  3:35 PM  Result Value Ref Range Status   Specimen Description BLOOD LEFT WRIST  Final   Special Requests   Final    BOTTLES DRAWN AEROBIC AND ANAEROBIC ANA 5CC AERO [place]   Culture NO GROWTH 5 DAYS  Final   Report Status 05/23/2015 FINAL  Final  Blood Culture (routine x 2)     Status: None   Collection Time: 06/06/2015  3:36 PM  Result  Value Ref Range Status   Specimen Description BLOOD RIGHT HAND  Final   Special Requests   Final    BOTTLES DRAWN AEROBIC AND ANAEROBIC ANA 2CC AERO 3CC   Culture NO GROWTH 5 DAYS  Final   Report Status 05/23/2015 FINAL  Final  Urine culture     Status: None   Collection Time: 05/15/2015  8:52 PM  Result Value Ref Range Status   Specimen Description URINE, RANDOM  Final   Special Requests NONE  Final   Culture INSIGNIFICANT GROWTH  Final   Report Status 05/20/2015 FINAL  Final  Body fluid culture     Status: None   Collection Time: 05/22/15  3:55 PM  Result Value Ref Range Status   Specimen Description PLEURAL  Final   Special Requests Normal  Final   Gram Stain FEW WBC SEEN NO ORGANISMS SEEN   Final   Culture NO GROWTH 4 DAYS  Final   Report Status 05/26/2015 FINAL  Final    Studies/Results: Dg Chest Port 1 View  05/26/2015  CLINICAL DATA:  Chest tube. EXAM: PORTABLE CHEST 1 VIEW COMPARISON:  05/25/2015. FINDINGS: Right chest tube in stable position. Mediastinum and hilar structures are normal. Persistent low lung volumes with basilar atelectasis. Small pleural effusions are stable. No progressive pleural effusion. No pneumothorax. IMPRESSION: 1. Right chest tube in stable position. No pneumothorax. No evidence of progressive pleural effusion. Small bilateral pleural effusions are present and stable. 2. Stable cardiomegaly. 3. Low lung volumes with basilar atelectasis. Electronically Signed   By: [place]   On: 05/26/2015 07:39   Dg Chest Port 1 View  05/25/2015  CLINICAL DATA:  Chest tube. EXAM: PORTABLE CHEST 1 VIEW COMPARISON:  CT 05/22/2015.  Chest x-ray 05/20/2015. FINDINGS: Right chest tube is noted. Decrease in right pleural effusion. No pneumothorax. Low lung volumes mild basilar atelectasis. Stable cardiomegaly. IMPRESSION: 1. Right chest tube noted. Decrease in size of right pleural effusion. No pneumothorax. 2. Stable cardiomegaly . Electronically Signed   By: Marcello Moores   Register   On: 05/25/2015 11:11   Ct Image Guided Drainage By Percutaneous Catheter  05/25/2015  CLINICAL DATA:  82 year old male with clinical concern for right-sided empyema. CT cattle pleural drainage catheter requested by cardiothoracic surgery. EXAM: CT IMAGE GUIDED DRAINAGE BY PERCUTANEOUS CATHETER Date: 05/25/2015 PROCEDURE: CT-guided biopsy right-sided chest tube Interventional Radiologist:  Criselda Peaches, MD ANESTHESIA/SEDATION: 50 mcg fentanyl administered intravenously MEDICATIONS: None additional TECHNIQUE: Informed consent was obtained from the patient following explanation of the procedure, risks, benefits and alternatives. The patient understands, agrees and consents for the procedure.  All questions were addressed. A time out was performed. A planning axial CT scan was performed. The loculated right pleural effusion was successfully identified. A suitable skin entry site was selected and marked. The region was then sterilely prepped and draped in standard fashion using chlorhexidine skin prep. Local anesthesia was attained by infiltration with 1% lidocaine. A small dermatotomy was made. Under intermittent CT fluoroscopic guidance, an 18 gauge trocar needle was advanced into the right pleural effusion. A short Amplatz wire was then advanced through the needle. The skin tract was dilated and a 12 [place] cook all-purpose drainage catheter modified with additional sideholes was advanced over the wire and formed. The tube was connected to low wall suction via a pleura vac. Post drain placement imaging demonstrates the drain well-positioned in the posterior and inferior aspect of the lung. There is been significant interval decrease in the volume of the pleural fluid. No evidence of pneumothorax or other complication. The drain was secured in place with Prolene suture and an adhesive fixation device. An air tight bandage was then applied. The patient tolerated the procedure well. COMPLICATIONS: None  Estimated blood loss:  0 IMPRESSION: Placement of a 12 French percutaneous drainage catheter into the loculated right-sided pleural effusion with aspiration of approximately 200 mL yellow pleural fluid. Signed, Criselda Peaches, MD [facility] Electronically Signed   By: Jacqulynn Cadet M.D.   On: 05/25/2015 15:35    Assessment/Plan: Lucas Newman is a 82 y.o. male with CML admitted with PNA and rash likely shingles. Has neg bcx ucx.  1 Health care associated PNA -  Complicated by loculated effusion. He has had thoracentesis which appears exudateive but not empyema. cx negative. Had placement of pigtail cath and then intrapleural thrombolytics. Cont vanco zosyn - with pigtail draining currently would extend to another 4 days Has finished azithro course.      Spoke with Velva Harman - daughter in law.  She feels that he is not really improving much and that he has made it clear to her and others that he does not want ANY MORE PROCEDURES>    I advised pushing on with IV abx and drainage until Monday then reassess and discuss more with Dr Megan Salon and Dr Humphrey Rolls. Thank you very much for the consult. Will follow with you.  [place], [place]   05/26/2015, 2:40 PM

## 2015-05-26 NOTE — Progress Notes (Signed)
Speech Therapy Note: reviewed chart notes; consulted NSG who reported pt was tolerating meals and meds w/ NSG, family. No overt s/s of aspiration have been reported. Rec'd continued use of aspiration precautions during any po's. ST will f/u next 2-3 days w/ pt's status and any further assessment/education as indicated. NSG agreed.

## 2015-05-27 LAB — CBC
HEMATOCRIT: 24.2 % — AB (ref 40.0–52.0)
Hemoglobin: 7.5 g/dL — ABNORMAL LOW (ref 13.0–18.0)
MCH: 29.3 pg (ref 26.0–34.0)
MCHC: 31.1 g/dL — AB (ref 32.0–36.0)
MCV: 94.2 fL (ref 80.0–100.0)
Platelets: 169 10*3/uL (ref 150–440)
RBC: 2.57 MIL/uL — ABNORMAL LOW (ref 4.40–5.90)
RDW: 15.9 % — AB (ref 11.5–14.5)
WBC: 49.7 10*3/uL — ABNORMAL HIGH (ref 3.8–10.6)

## 2015-05-27 LAB — BASIC METABOLIC PANEL
Anion gap: 7 (ref 5–15)
BUN: 40 mg/dL — AB (ref 6–20)
CHLORIDE: 107 mmol/L (ref 101–111)
CO2: 25 mmol/L (ref 22–32)
CREATININE: 2.53 mg/dL — AB (ref 0.61–1.24)
Calcium: 7.4 mg/dL — ABNORMAL LOW (ref 8.9–10.3)
GFR calc Af Amer: 24 mL/min — ABNORMAL LOW (ref >60)
GFR, EST NON AFRICAN AMERICAN: 20 mL/min — AB (ref >60)
GLUCOSE: 147 mg/dL — AB (ref 65–99)
Potassium: 3.5 mmol/L (ref 3.5–5.1)
SODIUM: 139 mmol/L (ref 135–145)

## 2015-05-27 LAB — MAGNESIUM: MAGNESIUM: 1.7 mg/dL (ref 1.7–2.4)

## 2015-05-27 MED ORDER — SODIUM CHLORIDE 0.9 % IV SOLN
INTRAVENOUS | Status: DC
  Administered 2015-05-27: 15:00:00 via INTRAVENOUS

## 2015-05-27 MED ORDER — GUAIFENESIN 100 MG/5ML PO SOLN
5.0000 mL | ORAL | Status: DC | PRN
  Administered 2015-05-27 – 2015-05-28 (×6): 100 mg via ORAL
  Filled 2015-05-27 (×6): qty 10

## 2015-05-27 MED ORDER — SODIUM CHLORIDE 0.9 % IV BOLUS (SEPSIS)
250.0000 mL | Freq: Once | INTRAVENOUS | Status: AC
  Administered 2015-05-27: 06:00:00 250 mL via INTRAVENOUS

## 2015-05-27 MED ORDER — FLUCONAZOLE 100 MG PO TABS
100.0000 mg | ORAL_TABLET | Freq: Every day | ORAL | Status: DC
  Administered 2015-05-27 – 2015-05-28 (×2): 100 mg via ORAL
  Filled 2015-05-27 (×2): qty 1

## 2015-05-27 MED ORDER — SODIUM CHLORIDE 0.9 % IV BOLUS (SEPSIS)
1000.0000 mL | Freq: Once | INTRAVENOUS | Status: AC
  Administered 2015-05-27: 1000 mL via INTRAVENOUS

## 2015-05-27 MED ORDER — PIPERACILLIN-TAZOBACTAM 3.375 G IVPB
3.3750 g | Freq: Two times a day (BID) | INTRAVENOUS | Status: DC
  Administered 2015-05-27 – 2015-05-29 (×4): 3.375 g via INTRAVENOUS
  Filled 2015-05-27 (×4): qty 50

## 2015-05-27 NOTE — Progress Notes (Signed)
Spoke to dr pyreddy re blood pressure, give 250 bolus at 999 have roudning md address low pressues

## 2015-05-27 NOTE — Progress Notes (Signed)
ANTIBIOTIC CONSULT NOTE - Follow Up  Pharmacy Consult for Vancomycin/Zosyn (Day 8/12) Indication: pneumonia  No Known Allergies  Patient Measurements: Height: 5\' 6"  (167.6 cm) Weight: 183 lb 12.8 oz (83.371 kg) IBW/kg (Calculated) : 63.8 Adjusted Body Weight: 71.6 kg  Vital Signs: Temp: 98.4 F (36.9 C) (01/21 0514) Temp Source: Oral (01/20 2118) BP: 87/50 mmHg (01/21 0514) Pulse Rate: 94 (01/21 0514) Intake/Output from previous day: 01/20 0701 - 01/21 0700 In: 0  Out: 60 [Chest Tube:60] Intake/Output from this shift:    Labs:  Recent Labs  05/26/15 0634 05/27/15 0455  WBC  --  49.7*  HGB  --  7.5*  PLT  --  169  CREATININE 1.83* 2.53*   Estimated Creatinine Clearance: 18.5 mL/min (by C-G formula based on Cr of 2.53). No results for input(s): VANCOTROUGH, VANCOPEAK, VANCORANDOM, GENTTROUGH, GENTPEAK, GENTRANDOM, TOBRATROUGH, TOBRAPEAK, TOBRARND, AMIKACINPEAK, AMIKACINTROU, AMIKACIN in the last 72 hours.   Microbiology: Recent Results (from the past 720 hour(s))  Blood Culture (routine x 2)     Status: None   Collection Time: 06/01/2015  3:35 PM  Result Value Ref Range Status   Specimen Description BLOOD LEFT WRIST  Final   Special Requests   Final    BOTTLES DRAWN AEROBIC AND ANAEROBIC ANA 5CC AERO [place]   Culture NO GROWTH 5 DAYS  Final   Report Status 05/23/2015 FINAL  Final  Blood Culture (routine x 2)     Status: None   Collection Time: 06/06/2015  3:36 PM  Result Value Ref Range Status   Specimen Description BLOOD RIGHT HAND  Final   Special Requests   Final    BOTTLES DRAWN AEROBIC AND ANAEROBIC ANA 2CC AERO 3CC   Culture NO GROWTH 5 DAYS  Final   Report Status 05/23/2015 FINAL  Final  Urine culture     Status: None   Collection Time: 05/10/2015  8:52 PM  Result Value Ref Range Status   Specimen Description URINE, RANDOM  Final   Special Requests NONE  Final   Culture INSIGNIFICANT GROWTH  Final   Report Status 05/20/2015 FINAL  Final  Body fluid culture      Status: None   Collection Time: 05/22/15  3:55 PM  Result Value Ref Range Status   Specimen Description PLEURAL  Final   Special Requests Normal  Final   Gram Stain FEW WBC SEEN NO ORGANISMS SEEN   Final   Culture NO GROWTH 4 DAYS  Final   Report Status 05/26/2015 FINAL  Final    Medical History: Past Medical History  Diagnosis Date  . Hyperlipidemia   . GERD (gastroesophageal reflux disease)   . Arthritis   . CVA (cerebral infarction)   . CML (chronic myelocytic leukemia) (HCC)     Medications:  Scheduled:  . budesonide (PULMICORT) nebulizer solution  0.25 mg Nebulization BID  . dipyridamole-aspirin  1 capsule Oral BID  . docusate sodium  100 mg Oral BID  . enoxaparin (LOVENOX) injection  30 mg Subcutaneous Q24H  . feeding supplement (ENSURE ENLIVE)  237 mL Oral Q24H  . finasteride  5 mg Oral Daily  . fluconazole  100 mg Oral Daily  . loratadine  10 mg Oral Daily  . magnesium oxide  400 mg Oral QHS  . multivitamin with minerals  1 tablet Oral Daily  . pantoprazole  40 mg Oral Daily  . piperacillin-tazobactam (ZOSYN)  IV  3.375 g Intravenous Q12H  . QUEtiapine  25 mg Oral QHS  . senna-docusate  1 tablet Oral QHS  . sodium chloride  1,000 mL Intravenous Once  . thiamine  100 mg Oral Daily  . vancomycin  750 mg Intravenous Q36H   Infusions:  . sodium chloride 75 mL/hr at 05/27/15 0830   Assessment:   Pharmacy consulted to dose vancomycin and Zosyn for PNA. Patient is currently on day 7/8 of Zosyn 3.375g IV Q8hr and vancomycin 750mg  IV Q36 hr.    Goal of Therapy:  Vancomycin trough level 15-20 mcg/ml  Plan:  Per MD note, will extend treatment for another 4 days of therapy.   Will renally adjust to Zosyn 3.375g IV Q12hr.  Vancomycin 750mg  IV Q36H is appropriate for renal function.    Pharmacy will continue to monitor and adjust per consult.   [place] Clinical Pharmacist 05/27/2015,9:02 AM

## 2015-05-27 NOTE — Progress Notes (Signed)
Alert and oriented x 4, hard of hearing, incontinent of urine, attempted to get up to chair but was unable to tolerate sitting and returned to bed with 2 person assist, on 4l oxygen, receiving schedules breathing treatments, eating fair from food brought from home, on iV fluids and antibiotics, anxious at times, back pain improved with oxy, pt is a DNR and patients family is leaning for patient to be comfort care if antibiotic therapy does not show improvement. Son has POA, family reports that there is no legal guardian. CIWA scale unremarkable.

## 2015-05-27 NOTE — Progress Notes (Signed)
CC: Pneumonia Subjective: 82 year old man with recent TPA administration into a right-sided chest tube for empyema. Recorded output has decreased significantly over the last 24 hours. Patient states that he continues to feel better and he is resting comfortably.  Objective: Vital signs in last 24 hours: Temp:  [97.1 F (36.2 C)-99.1 F (37.3 C)] 98.4 F (36.9 C) (01/21 0514) Pulse Rate:  [82-103] 94 (01/21 0514) Resp:  [18-20] 18 (01/21 0514) BP: (86-103)/(45-60) 98/60 mmHg (01/21 0920) SpO2:  [94 %-98 %] 96 % (01/21 1041) Last BM Date: 05/27/15  Intake/Output from previous day: 01/20 0701 - 01/21 0700 In: 0  Out: 60 [Chest Tube:60] Intake/Output this shift: Total I/O In: 1000 [IV Piggyback:1000] Out: -   Physical exam:  Gen.: No acute distress Chest: Tube in place to the right chest draining a serosanguineous fluid. Coarse breath sounds throughout Heart: Regular rhythm Abdomen: Soft, nontender  Lab Results: CBC   Recent Labs  05/27/15 0455  WBC 49.7*  HGB 7.5*  HCT 24.2*  PLT 169   BMET  Recent Labs  05/26/15 0634 05/27/15 0455  NA  --  139  K  --  3.5  CL  --  107  CO2  --  25  GLUCOSE  --  147*  BUN  --  40*  CREATININE 1.83* 2.53*  CALCIUM  --  7.4*   PT/INR No results for input(s): LABPROT, INR in the last 72 hours. ABG No results for input(s): PHART, HCO3 in the last 72 hours.  Invalid input(s): PCO2, PO2  Studies/Results: Dg Chest Port 1 View  05/26/2015  CLINICAL DATA:  Chest tube. EXAM: PORTABLE CHEST 1 VIEW COMPARISON:  05/25/2015. FINDINGS: Right chest tube in stable position. Mediastinum and hilar structures are normal. Persistent low lung volumes with basilar atelectasis. Small pleural effusions are stable. No progressive pleural effusion. No pneumothorax. IMPRESSION: 1. Right chest tube in stable position. No pneumothorax. No evidence of progressive pleural effusion. Small bilateral pleural effusions are present and stable. 2. Stable  cardiomegaly. 3. Low lung volumes with basilar atelectasis. Electronically Signed   By: Marcello Moores  Register   On: 05/26/2015 07:39    Anti-infectives: Anti-infectives    Start     Dose/Rate Route Frequency Ordered Stop   05/27/15 1730  piperacillin-tazobactam (ZOSYN) IVPB 3.375 g     3.375 g 12.5 mL/hr over 240 Minutes Intravenous Every 12 hours 05/27/15 0857 05/31/15 0529   05/27/15 1000  fluconazole (DIFLUCAN) tablet 100 mg     100 mg Oral Daily 05/27/15 0805     05/24/15 2000  vancomycin (VANCOCIN) IVPB 750 mg/150 ml premix     750 mg 150 mL/hr over 60 Minutes Intravenous Every 36 hours 05/23/15 1234 05/31/15 2000   05/20/15 1000  valACYclovir (VALTREX) tablet 1,000 mg  Status:  Discontinued     1,000 mg Oral Daily 05/19/15 1539 05/24/15 0950   05/20/15 0930  vancomycin (VANCOCIN) IVPB 750 mg/150 ml premix  Status:  Discontinued     750 mg 150 mL/hr over 60 Minutes Intravenous Every 24 hours 05/20/15 0901 05/23/15 1234   05/20/15 0900  piperacillin-tazobactam (ZOSYN) IVPB 3.375 g  Status:  Discontinued     3.375 g 12.5 mL/hr over 240 Minutes Intravenous 3 times per day 05/20/15 0850 05/27/15 0857   05/20/15 0000  vancomycin (VANCOCIN) IVPB 750 mg/150 ml premix  Status:  Discontinued     750 mg 150 mL/hr over 60 Minutes Intravenous Every 24 hours 05/19/15 1918 05/19/15 2131   05/19/15 2200  piperacillin-tazobactam (ZOSYN) IVPB 3.375 g  Status:  Discontinued     3.375 g 12.5 mL/hr over 240 Minutes Intravenous 3 times per day 05/19/15 1916 05/19/15 2131   05/19/15 2145  cefTRIAXone (ROCEPHIN) 1 g in dextrose 5 % 50 mL IVPB  Status:  Discontinued     1 g 100 mL/hr over 30 Minutes Intravenous Every 24 hours 05/19/15 2131 05/20/15 0845   05/19/15 0000  vancomycin (VANCOCIN) IVPB 750 mg/150 ml premix  Status:  Discontinued     750 mg 150 mL/hr over 60 Minutes Intravenous Every 24 hours 05/31/2015 2002 05/19/15 0625   05/26/2015 2300  piperacillin-tazobactam (ZOSYN) IVPB 3.375 g  Status:   Discontinued     3.375 g 12.5 mL/hr over 240 Minutes Intravenous 3 times per day 05/24/2015 2002 05/19/15 0625   05/31/2015 1900  cefTRIAXone (ROCEPHIN) 1 g in dextrose 5 % 50 mL IVPB  Status:  Discontinued     1 g 100 mL/hr over 30 Minutes Intravenous Every 24 hours 05/17/2015 1854 05/19/15 1625   05/31/2015 1900  azithromycin (ZITHROMAX) 250 mg in dextrose 5 % 125 mL IVPB  Status:  Discontinued     250 mg 125 mL/hr over 60 Minutes Intravenous Every 24 hours 05/24/2015 1854 05/22/15 1311   05/31/2015 1615  piperacillin-tazobactam (ZOSYN) IVPB 3.375 g     3.375 g 100 mL/hr over 30 Minutes Intravenous  Once 05/30/2015 1609 05/30/2015 1807   06/02/2015 1615  vancomycin (VANCOCIN) IVPB 1000 mg/200 mL premix     1,000 mg 200 mL/hr over 60 Minutes Intravenous  Once 05/27/2015 1609 05/26/2015 1948   05/23/2015 1600  valACYclovir (VALTREX) tablet 1,000 mg  Status:  Discontinued     1,000 mg Oral 3 times daily 05/08/2015 1534 05/19/15 1539   06/03/2015 1545  cefTRIAXone (ROCEPHIN) 1 g in dextrose 5 % 50 mL IVPB  Status:  Discontinued     1 g 100 mL/hr over 30 Minutes Intravenous  Once 05/20/2015 1534 05/28/2015 1609   06/04/2015 1545  azithromycin (ZITHROMAX) 500 mg in dextrose 5 % 250 mL IVPB  Status:  Discontinued     500 mg 250 mL/hr over 60 Minutes Intravenous  Once 05/09/2015 1534 05/31/2015 1609      Assessment/Plan:  82 year old male with a right-sided chest tube for a parapneumonic effusion. Recommend continuing to suction today. If output remains decreased and patient continues to do well could transitioned to water seal tomorrow. Surgery will continue to follow with you.  Lurena Naeve T. Adonis Huguenin, MD, FACS  05/27/2015

## 2015-05-27 NOTE — Progress Notes (Signed)
Patient ID: Lucas Newman, male   DOB: [birth year], 82 y.o.   MRN: [id number] [facility] PROGRESS NOTE  Lucas Newman [id number] DOB: [birth year] DOA: 05/07/2015 PCP: Lavera Guise, MD  HPI/Subjective: Patient complains of shortness of breath. He states that this is his main problem that he can't breathe. Patient more alert today than he has been when I saw him previously.  Objective: Filed Vitals:   05/27/15 0920 05/27/15 1235  BP: 98/60 107/52  Pulse:  96  Temp:  98.2 F (36.8 C)  Resp:  20    Filed Weights   05/09/2015 1521 05/27/2015 2022  Weight: 79.379 kg (175 lb) 83.371 kg (183 lb 12.8 oz)    ROS: Review of Systems  Constitutional: Negative for fever and chills.  Eyes: Negative for blurred vision.  Respiratory: Positive for cough and shortness of breath.   Cardiovascular: Negative for chest pain.  Gastrointestinal: Negative for nausea, vomiting, abdominal pain, diarrhea and constipation.  Genitourinary: Negative for dysuria.  Musculoskeletal: Negative for joint pain.  Neurological: Negative for dizziness and headaches.   Exam: Physical Exam  HENT:  Nose: No mucosal edema.  Mouth/Throat: No oropharyngeal exudate or posterior oropharyngeal edema.  Thrush positive  Eyes: Conjunctivae, EOM and lids are normal. Pupils are equal, round, and reactive to light.  Neck: No JVD present. Carotid bruit is not present. No edema present. No thyroid mass and no thyromegaly present.  Cardiovascular: S1 normal and S2 normal.  Exam reveals no gallop.   No murmur heard. Pulses:      Dorsalis pedis pulses are 2+ on the right side, and 2+ on the left side.  Respiratory: No respiratory distress. He has no wheezes. He has rhonchi in the right lower field and the left lower field. He has no rales.  GI: Soft. Bowel sounds are normal. There is no tenderness.  Musculoskeletal:       Right ankle: He exhibits swelling.       Left ankle: He exhibits swelling.   Lymphadenopathy:    He has no cervical adenopathy.  Neurological: He is alert.  Skin: Skin is warm. No rash noted. Nails show no clubbing.  Psychiatric: He has a normal mood and affect.      Data Reviewed: Basic Metabolic Panel:  Recent Labs Lab 05/22/15 0442 05/23/15 0434 05/24/15 0452 05/26/15 0634 05/27/15 0455  NA 145 145 146*  --  139  K 3.5 3.2* 3.5  --  3.5  CL 108 107 108  --  107  CO2 26 31 29   --  25  GLUCOSE 141* 168* 180*  --  147*  BUN 34* 29* 35*  --  40*  CREATININE 1.65* 1.49* 1.50* 1.83* 2.53*  CALCIUM 9.0 8.6* 8.7*  --  7.4*  MG  --   --   --   --  1.7   Liver Function Tests:  Recent Labs Lab 05/24/15 0452  AST 16  ALT 20  ALKPHOS 76  BILITOT 0.6  PROT 5.7*  ALBUMIN 2.3*   No results for input(s): LIPASE, AMYLASE in the last 168 hours. No results for input(s): AMMONIA in the last 168 hours. CBC:  Recent Labs Lab 05/22/15 0442 05/23/15 0434 05/24/15 0452 05/27/15 0455  WBC 53.6* 35.5* 37.3* 49.7*  HGB 9.5* 9.3* 8.8* 7.5*  HCT 30.1* 28.9* 27.9* 24.2*  MCV 90.8 92.6 94.7 94.2  PLT 243 209 192 169    CBG:  Recent Labs Lab 05/25/15 2131  GLUCAP 146*  Recent Results (from the past 240 hour(s))  Blood Culture (routine x 2)     Status: None   Collection Time: 06/06/2015  3:35 PM  Result Value Ref Range Status   Specimen Description BLOOD LEFT WRIST  Final   Special Requests   Final    BOTTLES DRAWN AEROBIC AND ANAEROBIC ANA 5CC AERO [place]   Culture NO GROWTH 5 DAYS  Final   Report Status 05/23/2015 FINAL  Final  Blood Culture (routine x 2)     Status: None   Collection Time: 05/10/2015  3:36 PM  Result Value Ref Range Status   Specimen Description BLOOD RIGHT HAND  Final   Special Requests   Final    BOTTLES DRAWN AEROBIC AND ANAEROBIC ANA 2CC AERO 3CC   Culture NO GROWTH 5 DAYS  Final   Report Status 05/23/2015 FINAL  Final  Urine culture     Status: None   Collection Time: 05/10/2015  8:52 PM  Result Value Ref Range Status    Specimen Description URINE, RANDOM  Final   Special Requests NONE  Final   Culture INSIGNIFICANT GROWTH  Final   Report Status 05/20/2015 FINAL  Final  Body fluid culture     Status: None   Collection Time: 05/22/15  3:55 PM  Result Value Ref Range Status   Specimen Description PLEURAL  Final   Special Requests Normal  Final   Gram Stain FEW WBC SEEN NO ORGANISMS SEEN   Final   Culture NO GROWTH 4 DAYS  Final   Report Status 05/26/2015 FINAL  Final     Studies: Dg Chest Port 1 View  05/26/2015  CLINICAL DATA:  Chest tube. EXAM: PORTABLE CHEST 1 VIEW COMPARISON:  05/25/2015. FINDINGS: Right chest tube in stable position. Mediastinum and hilar structures are normal. Persistent low lung volumes with basilar atelectasis. Small pleural effusions are stable. No progressive pleural effusion. No pneumothorax. IMPRESSION: 1. Right chest tube in stable position. No pneumothorax. No evidence of progressive pleural effusion. Small bilateral pleural effusions are present and stable. 2. Stable cardiomegaly. 3. Low lung volumes with basilar atelectasis. Electronically Signed   By: Marcello Moores  Register   On: 05/26/2015 07:39    Scheduled Meds: . budesonide (PULMICORT) nebulizer solution  0.25 mg Nebulization BID  . dipyridamole-aspirin  1 capsule Oral BID  . docusate sodium  100 mg Oral BID  . enoxaparin (LOVENOX) injection  30 mg Subcutaneous Q24H  . feeding supplement (ENSURE ENLIVE)  237 mL Oral Q24H  . finasteride  5 mg Oral Daily  . fluconazole  100 mg Oral Daily  . loratadine  10 mg Oral Daily  . magnesium oxide  400 mg Oral QHS  . multivitamin with minerals  1 tablet Oral Daily  . pantoprazole  40 mg Oral Daily  . piperacillin-tazobactam (ZOSYN)  IV  3.375 g Intravenous Q12H  . QUEtiapine  25 mg Oral QHS  . senna-docusate  1 tablet Oral QHS  . thiamine  100 mg Oral Daily  . vancomycin  750 mg Intravenous Q36H    Assessment/Plan:  1. Sepsis present on admission. As per Dr. Ola Spurr  continue vancomycin and Zosyn. Patient finished course of Zithromax. Patient has a chest tube in for drainage of the loculated parapneumonic effusion. 2. Acute respiratory failure with hypoxia on nasal cannula 3. Acute renal failure on chronic kidney disease stage III- I given IV bolus. I spoke with the pharmacist for dosing on the vancomycin and Zosyn. Continue IV fluid hydration. 4. Acute  encephalopathy- this seems better than previous with treatment of infection and Seroquel at night 5. 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 Hyperlipidemia unspecified on simvastatin 8. BPH- on finasteride. Flomax stopped 9. Gastroesophageal reflux disease on Protonix   Code Status:     Code Status Orders        Start     Ordered   05/20/15 0854  Do not attempt resuscitation (DNR)   Continuous    Question Answer Comment  In the event of cardiac or respiratory ARREST Do not call a "code blue"   In the event of cardiac or respiratory ARREST Do not perform Intubation, CPR, defibrillation or ACLS   In the event of cardiac or respiratory ARREST Use medication by any route, position, wound care, and other measures to relive pain and suffering. May use oxygen, suction and manual treatment of airway obstruction as needed for comfort.   Comments nurse may pronounce      05/20/15 0854    Code Status History    Date Active Date Inactive Code Status Order ID Comments User Context   05/20/2015  8:46 AM 05/20/2015  8:54 AM DNR [id number]  Loletha Grayer, MD Inpatient   05/30/2015  8:02 PM 05/20/2015  8:46 AM Full Code [id number]  Vaughan Basta, MD ED   03/24/2015  4:09 AM 03/28/2015  6:52 PM DNR [id number]  Harrie Foreman, MD Inpatient   09/04/2014  3:11 PM 09/07/2014  7:09 PM Full Code [id number]  Fritzi Mandes, MD ED    Advance Directive Documentation        Most Recent Value   Type of Advance Directive  Healthcare Power  of [place], Living will   Pre-existing out of facility DNR order (yellow form or pink MOST form)     "MOST" Form in Place?       Disposition Plan: Once chest tube comes out can potentially make a disposition  Consultants:  Surgery  Infectious disease  Pulmonary  Procedures:  Thoracentesis  Chest tube  Antibiotics:  Vancomycin  Zosyn  Time spent: 25 minutes  Loletha Grayer  [facility]

## 2015-05-27 NOTE — Progress Notes (Signed)
PER DR. Leslye Peer REORDER NS 0.9 AT 75 CC/HOUR, ORDER EXPIRED, TELEPHONE ORDER.

## 2015-05-28 LAB — BASIC METABOLIC PANEL
Anion gap: 10 (ref 5–15)
BUN: 42 mg/dL — AB (ref 6–20)
CHLORIDE: 108 mmol/L (ref 101–111)
CO2: 21 mmol/L — ABNORMAL LOW (ref 22–32)
Calcium: 7.4 mg/dL — ABNORMAL LOW (ref 8.9–10.3)
Creatinine, Ser: 2.66 mg/dL — ABNORMAL HIGH (ref 0.61–1.24)
GFR calc Af Amer: 22 mL/min — ABNORMAL LOW (ref >60)
GFR calc non Af Amer: 19 mL/min — ABNORMAL LOW (ref >60)
Glucose, Bld: 182 mg/dL — ABNORMAL HIGH (ref 65–99)
POTASSIUM: 3.5 mmol/L (ref 3.5–5.1)
SODIUM: 139 mmol/L (ref 135–145)

## 2015-05-28 MED ORDER — SODIUM CHLORIDE 0.9 % IV SOLN
INTRAVENOUS | Status: DC
  Administered 2015-05-28: 16:00:00 via INTRAVENOUS

## 2015-05-28 NOTE — Progress Notes (Signed)
Patient ID: Lucas Newman, male   DOB: April 13, 1920, 82 y.o.   MRN: [id number] [facility] PROGRESS NOTE  Lucas Newman [id number] DOB: Mar 22, 1920 DOA: 05/15/2015 PCP: Lucas Guise, MD  HPI/Subjective: Patient complaining of shortness of breath. He does not feel well at all. Patient was trying to get things organized with family with living will. Family concerned about edema.  Objective: Filed Vitals:   05/28/15 1035 05/28/15 1254  BP:  108/68  Pulse: 131 106  Temp:  97.9 F (36.6 C)  Resp:  20    Filed Weights   06/02/2015 1521 05/22/2015 2022  Weight: 79.379 kg (175 lb) 83.371 kg (183 lb 12.8 oz)    ROS: Review of Systems  Constitutional: Negative for fever and chills.  Eyes: Negative for blurred vision.  Respiratory: Positive for cough and shortness of breath.   Cardiovascular: Negative for chest pain.  Gastrointestinal: Negative for nausea, vomiting, abdominal pain, diarrhea and constipation.  Genitourinary: Negative for dysuria.  Musculoskeletal: Negative for joint pain.  Neurological: Negative for dizziness and headaches.   Exam: Physical Exam  HENT:  Nose: No mucosal edema.  Mouth/Throat: No oropharyngeal exudate or posterior oropharyngeal edema.  Thrush positive  Eyes: Conjunctivae, EOM and lids are normal. Pupils are equal, round, and reactive to light.  Neck: No JVD present. Carotid bruit is not present. No edema present. No thyroid mass and no thyromegaly present.  Cardiovascular: S1 normal and S2 normal.  Exam reveals no gallop.   No murmur heard. Pulses:      Dorsalis pedis pulses are 2+ on the right side, and 2+ on the left side.  Respiratory: No respiratory distress. He has no wheezes. He has rhonchi in the right lower field and the left lower field. He has no rales.  GI: Soft. Bowel sounds are normal. There is no tenderness.  Musculoskeletal:       Right ankle: He exhibits swelling.       Left ankle: He exhibits swelling.   Lymphadenopathy:    He has no cervical adenopathy.  Neurological: He is alert.  Skin: Skin is warm. No rash noted. Nails show no clubbing.  Psychiatric: He has a normal mood and affect.      Data Reviewed: Basic Metabolic Panel:  Recent Labs Lab 05/22/15 0442 05/23/15 0434 05/24/15 0452 05/26/15 0634 05/27/15 0455 05/28/15 0553  NA 145 145 146*  --  139 139  K 3.5 3.2* 3.5  --  3.5 3.5  CL 108 107 108  --  107 108  CO2 26 31 29   --  25 21*  GLUCOSE 141* 168* 180*  --  147* 182*  BUN 34* 29* 35*  --  40* 42*  CREATININE 1.65* 1.49* 1.50* 1.83* 2.53* 2.66*  CALCIUM 9.0 8.6* 8.7*  --  7.4* 7.4*  MG  --   --   --   --  1.7  --    Liver Function Tests:  Recent Labs Lab 05/24/15 0452  AST 16  ALT 20  ALKPHOS 76  BILITOT 0.6  PROT 5.7*  ALBUMIN 2.3*   CBC:  Recent Labs Lab 05/22/15 0442 05/23/15 0434 05/24/15 0452 05/27/15 0455  WBC 53.6* 35.5* 37.3* 49.7*  HGB 9.5* 9.3* 8.8* 7.5*  HCT 30.1* 28.9* 27.9* 24.2*  MCV 90.8 92.6 94.7 94.2  PLT 243 209 192 169     Recent Results (from the past 240 hour(s))  Blood Culture (routine x 2)     Status: None  Collection Time: 05/11/2015  3:35 PM  Result Value Ref Range Status   Specimen Description BLOOD LEFT WRIST  Final   Special Requests   Final    BOTTLES DRAWN AEROBIC AND ANAEROBIC ANA 5CC AERO Lucas Newman   Culture NO GROWTH 5 DAYS  Final   Report Status 05/23/2015 FINAL  Final  Blood Culture (routine x 2)     Status: None   Collection Time: 05/28/2015  3:36 PM  Result Value Ref Range Status   Specimen Description BLOOD RIGHT HAND  Final   Special Requests   Final    BOTTLES DRAWN AEROBIC AND ANAEROBIC ANA 2CC AERO 3CC   Culture NO GROWTH 5 DAYS  Final   Report Status 05/23/2015 FINAL  Final  Urine culture     Status: None   Collection Time: 05/30/2015  8:52 PM  Result Value Ref Range Status   Specimen Description URINE, RANDOM  Final   Special Requests NONE  Final   Culture INSIGNIFICANT GROWTH  Final   Report  Status 05/20/2015 FINAL  Final  Body fluid culture     Status: None   Collection Time: 05/22/15  3:55 PM  Result Value Ref Range Status   Specimen Description PLEURAL  Final   Special Requests Normal  Final   Gram Stain FEW WBC SEEN NO ORGANISMS SEEN   Final   Culture NO GROWTH 4 DAYS  Final   Report Status 05/26/2015 FINAL  Final      Scheduled Meds: . budesonide (PULMICORT) nebulizer solution  0.25 mg Nebulization BID  . dipyridamole-aspirin  1 capsule Oral BID  . docusate sodium  100 mg Oral BID  . enoxaparin (LOVENOX) injection  30 mg Subcutaneous Q24H  . feeding supplement (ENSURE ENLIVE)  237 mL Oral Q24H  . finasteride  5 mg Oral Daily  . fluconazole  100 mg Oral Daily  . loratadine  10 mg Oral Daily  . magnesium oxide  400 mg Oral QHS  . multivitamin with minerals  1 tablet Oral Daily  . pantoprazole  40 mg Oral Daily  . piperacillin-tazobactam (ZOSYN)  IV  3.375 g Intravenous Q12H  . QUEtiapine  25 mg Oral QHS  . senna-docusate  1 tablet Oral QHS  . thiamine  100 mg Oral Daily  . vancomycin  750 mg Intravenous Q36H    Assessment/Plan:  1. Sepsis present on admission. As per Dr. Ola Newman continue vancomycin and Zosyn. Patient finished course of Zithromax. Patient has a chest tube in for drainage of the loculated parapneumonic effusion. Surgery to decide when this chest tube comes out. 2. Acute respiratory failure with hypoxia on nasal cannula 3. Acute renal failure on chronic kidney disease stage III- I given IV bolus. I spoke with the pharmacist for dosing on the vancomycin and Zosyn. Continue IV fluid hydration at lower rate as requested by family. Check a BMP tomorrow morning 4. Acute encephalopathy- this seems better than previous with treatment of infection and Seroquel at night 5. 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 Hyperlipidemia unspecified on simvastatin 8. BPH- on  finasteride. Flomax stopped 9. Gastroesophageal reflux disease on Protonix 10. Anemia of chronic disease- check hemoglobin in the a.m. if low will give a transfusion.   Code Status:     Code Status Orders        Start     Ordered   05/20/15 0854  Do not attempt resuscitation (DNR)  Continuous    Question Answer Comment  In the event of cardiac or respiratory ARREST Do not call a "code blue"   In the event of cardiac or respiratory ARREST Do not perform Intubation, CPR, defibrillation or ACLS   In the event of cardiac or respiratory ARREST Use medication by any route, position, wound care, and other measures to relive pain and suffering. May use oxygen, suction and manual treatment of airway obstruction as needed for comfort.   Comments nurse may pronounce      05/20/15 0854    Code Status History    Date Active Date Inactive Code Status Order ID Comments User Context   05/20/2015  8:46 AM 05/20/2015  8:54 AM DNR [id number]  Loletha Grayer, MD Inpatient   05/22/2015  8:02 PM 05/20/2015  8:46 AM Full Code [id number]  Vaughan Basta, MD ED   03/24/2015  4:09 AM 03/28/2015  6:52 PM DNR [id number]  Harrie Foreman, MD Inpatient   09/04/2014  3:11 PM 09/07/2014  7:09 PM Full Code [id number]  Fritzi Mandes, MD ED    Advance Directive Documentation        Most Recent Value   Type of Advance Directive  Healthcare Power of [place], Living will   Pre-existing out of facility DNR order (yellow form or pink MOST form)     "MOST" Form in Place?       Disposition Plan: Once chest tube comes out can potentially make a disposition. Case discussed with daughter on phone. Case also discussed with son and daughter-in-law at the bedside.  Consultants:  Surgery  Infectious disease  Pulmonary  Procedures:  Thoracentesis  Chest tube  Antibiotics:  Vancomycin  Zosyn  Time spent: 25 minutes  Loletha Grayer  [facility]

## 2015-05-28 NOTE — Progress Notes (Signed)
CC: Pleural effusion Subjective: Patient resting comfortably this morning  Objective: Vital signs in last 24 hours: Temp:  [97.7 F (36.5 C)-98.2 F (36.8 C)] 97.7 F (36.5 C) (01/22 0601) Pulse Rate:  [76-131] 131 (01/22 1035) Resp:  [20] 20 (01/22 0601) BP: (107-121)/(52-66) 109/66 mmHg (01/22 0601) SpO2:  [93 %-100 %] 93 % (01/22 1035) Last BM Date: 05/28/15  Intake/Output from previous day: 01/21 0701 - 01/22 0700 In: 2310 [P.O.:360; I.V.:900; IV Piggyback:1050] Out: 0  Intake/Output this shift:    Physical exam:  Gen.: No acute distress Chest: Right-sided chest tube in place. No air leak on exam today. No recorded output for the last 24 hours. Heart: Tachycardic  Lab Results: CBC   Recent Labs  05/27/15 0455  WBC 49.7*  HGB 7.5*  HCT 24.2*  PLT 169   BMET  Recent Labs  05/27/15 0455 05/28/15 0553  NA 139 139  K 3.5 3.5  CL 107 108  CO2 25 21*  GLUCOSE 147* 182*  BUN 40* 42*  CREATININE 2.53* 2.66*  CALCIUM 7.4* 7.4*   PT/INR No results for input(s): LABPROT, INR in the last 72 hours. ABG No results for input(s): PHART, HCO3 in the last 72 hours.  Invalid input(s): PCO2, PO2  Studies/Results: No results found.  Anti-infectives: Anti-infectives    Start     Dose/Rate Route Frequency Ordered Stop   05/27/15 1730  piperacillin-tazobactam (ZOSYN) IVPB 3.375 g     3.375 g 12.5 mL/hr over 240 Minutes Intravenous Every 12 hours 05/27/15 0857 05/31/15 0529   05/27/15 1000  fluconazole (DIFLUCAN) tablet 100 mg     100 mg Oral Daily 05/27/15 0805     05/24/15 2000  vancomycin (VANCOCIN) IVPB 750 mg/150 ml premix     750 mg 150 mL/hr over 60 Minutes Intravenous Every 36 hours 05/23/15 1234 05/31/15 2000   05/20/15 1000  valACYclovir (VALTREX) tablet 1,000 mg  Status:  Discontinued     1,000 mg Oral Daily 05/19/15 1539 05/24/15 0950   05/20/15 0930  vancomycin (VANCOCIN) IVPB 750 mg/150 ml premix  Status:  Discontinued     750 mg 150 mL/hr over  60 Minutes Intravenous Every 24 hours 05/20/15 0901 05/23/15 1234   05/20/15 0900  piperacillin-tazobactam (ZOSYN) IVPB 3.375 g  Status:  Discontinued     3.375 g 12.5 mL/hr over 240 Minutes Intravenous 3 times per day 05/20/15 0850 05/27/15 0857   05/20/15 0000  vancomycin (VANCOCIN) IVPB 750 mg/150 ml premix  Status:  Discontinued     750 mg 150 mL/hr over 60 Minutes Intravenous Every 24 hours 05/19/15 1918 05/19/15 2131   05/19/15 2200  piperacillin-tazobactam (ZOSYN) IVPB 3.375 g  Status:  Discontinued     3.375 g 12.5 mL/hr over 240 Minutes Intravenous 3 times per day 05/19/15 1916 05/19/15 2131   05/19/15 2145  cefTRIAXone (ROCEPHIN) 1 g in dextrose 5 % 50 mL IVPB  Status:  Discontinued     1 g 100 mL/hr over 30 Minutes Intravenous Every 24 hours 05/19/15 2131 05/20/15 0845   05/19/15 0000  vancomycin (VANCOCIN) IVPB 750 mg/150 ml premix  Status:  Discontinued     750 mg 150 mL/hr over 60 Minutes Intravenous Every 24 hours 06/05/2015 2002 05/19/15 0625   05/21/2015 2300  piperacillin-tazobactam (ZOSYN) IVPB 3.375 g  Status:  Discontinued     3.375 g 12.5 mL/hr over 240 Minutes Intravenous 3 times per day 05/10/2015 2002 05/19/15 0625   05/17/2015 1900  cefTRIAXone (ROCEPHIN) 1 g  in dextrose 5 % 50 mL IVPB  Status:  Discontinued     1 g 100 mL/hr over 30 Minutes Intravenous Every 24 hours 05/26/2015 1854 05/19/15 1625   06/03/2015 1900  azithromycin (ZITHROMAX) 250 mg in dextrose 5 % 125 mL IVPB  Status:  Discontinued     250 mg 125 mL/hr over 60 Minutes Intravenous Every 24 hours 05/19/2015 1854 05/22/15 1311   05/07/2015 1615  piperacillin-tazobactam (ZOSYN) IVPB 3.375 g     3.375 g 100 mL/hr over 30 Minutes Intravenous  Once 05/30/2015 1609 05/28/2015 1807   05/30/2015 1615  vancomycin (VANCOCIN) IVPB 1000 mg/200 mL premix     1,000 mg 200 mL/hr over 60 Minutes Intravenous  Once 05/11/2015 1609 06/06/2015 1948   05/08/2015 1600  valACYclovir (VALTREX) tablet 1,000 mg  Status:  Discontinued     1,000 mg  Oral 3 times daily 05/27/2015 1534 05/19/15 1539   06/04/2015 1545  cefTRIAXone (ROCEPHIN) 1 g in dextrose 5 % 50 mL IVPB  Status:  Discontinued     1 g 100 mL/hr over 30 Minutes Intravenous  Once 05/12/2015 1534 05/17/2015 1609   05/17/2015 1545  azithromycin (ZITHROMAX) 500 mg in dextrose 5 % 250 mL IVPB  Status:  Discontinued     500 mg 250 mL/hr over 60 Minutes Intravenous  Once 05/25/2015 1534 06/03/2015 1609      Assessment/Plan:  82 year old male with a right-sided empyema with chest tube in place. No recorded output over the last 24 hours. We'll place drain to waterseal today. Possible removal of the drain tomorrow. Patient will be seen by Dr. Genevive Bi tomorrow.  Ivory Bail T. Adonis Huguenin, MD, FACS  05/28/2015

## 2015-05-28 NOTE — Progress Notes (Signed)
PT Cancellation Note  Patient Details Name: Lucas Newman MRN: [id number] DOB: 1919/07/06   Cancelled Treatment:    Reason Eval/Treat Not Completed: Medical issues which prohibited therapy (Per chart, patient tolerating very limited amounts of activity at this time (unable to tolerate OOB to chair with RN); family considering transition to comfort care.  Will hold continued PT at this time until clear goals of care established.  Will hold at this time and continue services pending medical course.)   Lavanya Roa H. Owens Shark, PT, DPT, NCS 05/28/2015, 10:19 AM [phone]

## 2015-05-29 ENCOUNTER — Inpatient Hospital Stay: Payer: Medicare Other

## 2015-05-29 MED ORDER — GLYCOPYRROLATE 0.2 MG/ML IJ SOLN: 0.4000 mg | INTRAMUSCULAR | Status: DC

## 2015-05-29 MED ORDER — MORPHINE SULFATE (PF) 4 MG/ML IV SOLN
4.0000 mg | INTRAVENOUS | Status: DC | PRN
  Administered 2015-05-29: 4 mg via INTRAVENOUS

## 2015-05-29 MED ORDER — SODIUM CHLORIDE 0.9 % IV SOLN
INTRAVENOUS | Status: DC
  Administered 2015-05-29: 06:00:00 via INTRAVENOUS

## 2015-05-29 MED ORDER — LORAZEPAM 2 MG/ML IJ SOLN: 1.0000 mg | INTRAMUSCULAR | Status: DC | PRN

## 2015-05-29 MED ORDER — MORPHINE 100MG IN NS 100ML (1MG/ML) PREMIX INFUSION
5.0000 mg/h | INTRAVENOUS | Status: DC
  Administered 2015-05-29: 1 mg/h via INTRAVENOUS
  Filled 2015-05-29: qty 100

## 2015-05-29 MED ORDER — SODIUM CHLORIDE 0.9 % IV SOLN: 1.0000 mg/h | INTRAVENOUS | Status: DC

## 2015-05-29 MED ORDER — MORPHINE SULFATE (PF) 4 MG/ML IV SOLN
INTRAVENOUS | Status: AC
  Administered 2015-05-29: 4 mg via INTRAVENOUS
  Filled 2015-05-29: qty 1

## 2015-06-07 NOTE — Discharge Summary (Signed)
[place] [facility] [place] NAME: Lucas Newman    MR#:  [id number]  DATE OF BIRTH:  November 27, 1919  DATE OF ADMISSION:  05/11/2015 ADMITTING PHYSICIAN: Vaughan Basta, MD  DATE OF DISCHARGE: 2015/05/31  8:43 AM  PRIMARY CARE PHYSICIAN: Lavera Guise, MD    ADMISSION DIAGNOSIS:  Leukocytosis [D72.829] Shingles rash [B02.9] Pleural effusion, bilateral [J90] Chronic myelomonocytic leukemia, in relapse (Lucas Newman) [C93.12] Chronic kidney disease, unspecified stage [N18.9] Sepsis, due to unspecified organism (Lucas Newman) [A41.9] Pneumonia of right upper lobe due to infectious organism [J18.9]  DISCHARGE DIAGNOSIS:  Principal Problem:   Pneumonia Active Problems:   Shingles   Chronic myelomonocytic leukemia, in relapse (Lucas Newman)   Pleural effusion   SECONDARY DIAGNOSIS:   Past Medical History  Diagnosis Date  . Hyperlipidemia   . GERD (gastroesophageal reflux disease)   . Arthritis   . CVA (cerebral infarction)   . CML (chronic myelocytic leukemia) (Lucas Newman)     HOSPITAL COURSE:   1. Sepsis present on admission with pneumonia and loculated area pneumonic effusion likely empyema. Patient was started on vancomycin and Zosyn and Zithromax. Patient had a thoracentesis done and then a chest tube afterwards. Patient seemed to get a little bit better with treatment and drainage of pleural effusion. He was not eating very well. Patient seemed to give up. Patient told his family that he was to make it. He was made comfort care overnight for declining respiratory status on the morning of May 31, 2015. When I saw him he was very uncomfortable and struggling to breathe and I gave him IV morphine 4 mg IV 1 and increase morphine drip to 5 mg per hour. Patient was pronounced dead at 8:43 AM. 2. Acute respiratory failure with hypoxia- patient required nasal cannula during the entire hospital course 3. Acute renal failure on chronic kidney disease stage III 4.  acute encephalopathy- patient had delirium with acute infection and not sleeping. Improved with treatment of infection and Seroquel 5. Atrial fibrillation with rapid ventricular response 6. CML- patient always had a high white blood count 7. Hyperlipidemia unspecified 8. BPH 9. Gastroesophageal reflux disease on Protonix 10. Anemia of chronic disease   CONSULTS OBTAINED:  Treatment Team:  Adrian Prows, MD Cammie Sickle, MD Allyne Gee, MD Nestor Lewandowsky, MD  DRUG ALLERGIES:  No Known Allergies   DATA REVIEW:   CBC  Recent Labs Lab 05/27/15 0455  WBC 49.7*  HGB 7.5*  HCT 24.2*  PLT 169    Chemistries   Recent Labs Lab 05/24/15 0452  05/27/15 0455 05/28/15 0553  NA 146*  --  139 139  K 3.5  --  3.5 3.5  CL 108  --  107 108  CO2 29  --  25 21*  GLUCOSE 180*  --  147* 182*  BUN 35*  --  40* 42*  CREATININE 1.50*  < > 2.53* 2.66*  CALCIUM 8.7*  --  7.4* 7.4*  MG  --   --  1.7  --   AST 16  --   --   --   ALT 20  --   --   --   ALKPHOS 76  --   --   --   BILITOT 0.6  --   --   --   < > = values in this interval not displayed.   Microbiology Results  Results for orders placed or performed during the hospital encounter of 05/26/2015  Blood Culture (routine x  2)     Status: None   Collection Time: 05/28/2015  3:35 PM  Result Value Ref Range Status   Specimen Description BLOOD LEFT WRIST  Final   Special Requests   Final    BOTTLES DRAWN AEROBIC AND ANAEROBIC ANA 5CC AERO [place]   Culture NO GROWTH 5 DAYS  Final   Report Status 05/23/2015 FINAL  Final  Blood Culture (routine x 2)     Status: None   Collection Time: 05/08/2015  3:36 PM  Result Value Ref Range Status   Specimen Description BLOOD RIGHT HAND  Final   Special Requests   Final    BOTTLES DRAWN AEROBIC AND ANAEROBIC ANA 2CC AERO 3CC   Culture NO GROWTH 5 DAYS  Final   Report Status 05/23/2015 FINAL  Final  Urine culture     Status: None   Collection Time: 05/08/2015  8:52 PM  Result Value  Ref Range Status   Specimen Description URINE, RANDOM  Final   Special Requests NONE  Final   Culture INSIGNIFICANT GROWTH  Final   Report Status 05/20/2015 FINAL  Final  Body fluid culture     Status: None   Collection Time: 05/22/15  3:55 PM  Result Value Ref Range Status   Specimen Description PLEURAL  Final   Special Requests Normal  Final   Gram Stain FEW WBC SEEN NO ORGANISMS SEEN   Final   Culture NO GROWTH 4 DAYS  Final   Report Status 05/26/2015 FINAL  Final    CODE STATUS:  Code Status History    Date Active Date Inactive Code Status Order ID Comments User Context   05/20/2015  8:54 AM 2015/06/28  2:28 PM DNR [id number]  Loletha Grayer, MD Inpatient   05/20/2015  8:46 AM 05/20/2015  8:54 AM DNR [id number]  Loletha Grayer, MD Inpatient   05/26/2015  8:02 PM 05/20/2015  8:46 AM Full Code [id number]  Vaughan Basta, MD ED   03/24/2015  4:09 AM 03/28/2015  6:52 PM DNR [id number]  Harrie Foreman, MD Inpatient   09/04/2014  3:11 PM 09/07/2014  7:09 PM Full Code [id number]  Fritzi Mandes, MD ED    Questions for Most Recent Historical Code Status (Order [id number])    Question Answer Comment   In the event of cardiac or respiratory ARREST Do not call a "code blue"    In the event of cardiac or respiratory ARREST Do not perform Intubation, CPR, defibrillation or ACLS    In the event of cardiac or respiratory ARREST Use medication by any route, position, wound care, and other measures to relive pain and suffering. May use oxygen, suction and manual treatment of airway obstruction as needed for comfort.    Comments nurse may pronounce     Advance Directive Documentation        Most Recent Value   Type of Advance Directive  Healthcare Power of Attorney, Living will   Pre-existing out of facility DNR order (yellow form or pink MOST form)     "MOST" Form in Place?        TOTAL TIME TAKING CARE OF THIS PATIENT: 25 minutes.    Loletha Grayer M.D on Jun 28, 2015 at 4:17 PM  Between 7am  to 6pm - Pager - [phone]  After 6pm go to [email or web address] - password [email or web address] [facility]  Office  [phone]  CC: Primary care physician; Lavera Guise, MD

## 2015-06-07 NOTE — Progress Notes (Signed)
   06-09-2015 0850  Clinical Encounter Type  Visited With Patient and family together  Visit Type Death  Referral From Nurse  Consult/Referral To Chaplain  Spiritual Encounters  Spiritual Needs Ritual;Prayer;Sacred text;Grief support;Emotional  Stress Factors  Family Stress Factors Loss  Provided grief support & pastoral care to family. Chap. Gamal Todisco G. Hale

## 2015-06-07 NOTE — Progress Notes (Signed)
Patient ID: Lucas Newman, male   DOB: [birth year], 82 y.o.   MRN: [id number]  Patient pronounced dead at 8:43am No spontaneous heartbeat or respirations  Family thanked me for my care.  Dr Leslye Peer

## 2015-06-07 NOTE — Progress Notes (Signed)
Notified patients son(Larry) of his current condition-states he is coming in to see him

## 2015-06-07 NOTE — Plan of Care (Signed)
Pt's time of death was 08:31. 2nd nurse to verify was Bretta Bang. He wasn't a Nurse May Pronounce - Dr. Leslye Peer pronounced at [phone].  Pt had been on morphine drip since 0655.  Rate was changed to 70ml/hr at 0756.  1 extra dose of 4 mg morphine given  At 0750.  Pt looked comfortable and family was present at bedside.  [facility] was called along w/nurse supervisor and chaplain. [facility] chose [facility] [place].

## 2015-06-07 NOTE — Progress Notes (Signed)
Discussed with patients son who is POA who wants only comfort care and supportive care. Does not want any IV fluids.Discussed with RN taking care of the patient and comfort measures started.

## 2015-06-07 NOTE — Progress Notes (Signed)
Pt became SOB, wheezing and breath sounds coarse, MD notified, fluids held for now per MD, breathing tx given.

## 2015-06-07 NOTE — Progress Notes (Signed)
Patient ID: Lucas Newman, male   DOB: 1919/10/10, 82 y.o.   MRN: [id number] [facility] PROGRESS NOTE  HIREN VITUCCI [id number] DOB: 1919/09/19 DOA: 05/23/2015 PCP: Lavera Guise, MD  HPI/Subjective: Patient struggling to breath. Able to talk a little bit. Said that he cant breathe.  Objective: Filed Vitals:   05/28/15 2101 06-11-15 0527  BP: 94/58 66/42  Pulse: 90 85  Temp: 97.3 F (36.3 C) 97.7 F (36.5 C)  Resp: 22 24    Filed Weights   05/23/2015 1521 05/17/2015 2022 11-Jun-2015 0500  Weight: 79.379 kg (175 lb) 83.371 kg (183 lb 12.8 oz) 86.501 kg (190 lb 11.2 oz)    ROS: Review of Systems  Unable to perform ROS Respiratory: Positive for cough and shortness of breath.   limited with increased respirations Exam: Physical Exam  HENT:  Nose: No mucosal edema.  Mouth/Throat: No oropharyngeal exudate or posterior oropharyngeal edema.  Thrush positive  Eyes: Conjunctivae, EOM and lids are normal. Pupils are equal, round, and reactive to light.  Neck: No JVD present. Carotid bruit is not present. No edema present. No thyroid mass and no thyromegaly present.  Cardiovascular: S1 normal and S2 normal.  Exam reveals no gallop.   No murmur heard. Pulses:      Dorsalis pedis pulses are 2+ on the right side, and 2+ on the left side.  Respiratory: Accessory muscle usage present. He has no wheezes. He has rhonchi in the right upper field, the right middle field, the right lower field, the left upper field, the left middle field and the left lower field. He has no rales.  GI: Soft. Bowel sounds are normal. There is no tenderness.  Musculoskeletal:       Right ankle: He exhibits swelling.       Left ankle: He exhibits swelling.  Lymphadenopathy:    He has no cervical adenopathy.  Neurological: He is alert.  Skin: Skin is warm. Nails show no clubbing.      Data Reviewed: Basic Metabolic Panel:  Recent Labs Lab 05/23/15 0434 05/24/15 0452 05/26/15 0634  05/27/15 0455 05/28/15 0553  NA 145 146*  --  139 139  K 3.2* 3.5  --  3.5 3.5  CL 107 108  --  107 108  CO2 31 29  --  25 21*  GLUCOSE 168* 180*  --  147* 182*  BUN 29* 35*  --  40* 42*  CREATININE 1.49* 1.50* 1.83* 2.53* 2.66*  CALCIUM 8.6* 8.7*  --  7.4* 7.4*  MG  --   --   --  1.7  --    Liver Function Tests:  Recent Labs Lab 05/24/15 0452  AST 16  ALT 20  ALKPHOS 76  BILITOT 0.6  PROT 5.7*  ALBUMIN 2.3*   CBC:  Recent Labs Lab 05/23/15 0434 05/24/15 0452 05/27/15 0455  WBC 35.5* 37.3* 49.7*  HGB 9.3* 8.8* 7.5*  HCT 28.9* 27.9* 24.2*  MCV 92.6 94.7 94.2  PLT 209 192 169     Recent Results (from the past 240 hour(s))  Body fluid culture     Status: None   Collection Time: 05/22/15  3:55 PM  Result Value Ref Range Status   Specimen Description PLEURAL  Final   Special Requests Normal  Final   Gram Stain FEW WBC SEEN NO ORGANISMS SEEN   Final   Culture NO GROWTH 4 DAYS  Final   Report Status 05/26/2015 FINAL  Final  Scheduled Meds: . glycopyrrolate  0.4 mg Intravenous Q4H  . morphine        Assessment/Plan:  1. Sepsis present on admission. Chest tube for infected pleural effusion 2. Acute respiratory failure with hypoxia on nasal cannula 3. Acute renal failure on chronic kidney disease stage III 4. Acute encephalopathy 5. Atrial fibrillation with rapid ventricular response 6. CML- 7. Hyperlipidemia unspecifed 8. BPH 9. Gastroesophageal reflux disease  10. Anemia of chronic disease 11. Hypotension  Patient made comfort care.  I will order 4 mg iv morphine now and q1 prn.  Increase morphine drip to 5mg /hr.  Glycopyrolate and ativan.  I do not think patient will survive much longer with the pattern of breathing.  Case discussed with nursing staff.   Code Status:     Code Status Orders        Start     Ordered   05/20/15 0854  Do not attempt resuscitation (DNR)   Continuous    Question Answer Comment  In the event of cardiac or  respiratory ARREST Do not call a "code blue"   In the event of cardiac or respiratory ARREST Do not perform Intubation, CPR, defibrillation or ACLS   In the event of cardiac or respiratory ARREST Use medication by any route, position, wound care, and other measures to relive pain and suffering. May use oxygen, suction and manual treatment of airway obstruction as needed for comfort.   Comments nurse may pronounce      05/20/15 0854    Code Status History    Date Active Date Inactive Code Status Order ID Comments User Context   05/20/2015  8:46 AM 05/20/2015  8:54 AM DNR [id number]  Loletha Grayer, MD Inpatient   05/10/2015  8:02 PM 05/20/2015  8:46 AM Full Code [id number]  Vaughan Basta, MD ED   03/24/2015  4:09 AM 03/28/2015  6:52 PM DNR [id number]  Harrie Foreman, MD Inpatient   09/04/2014  3:11 PM 09/07/2014  7:09 PM Full Code [id number]  Fritzi Mandes, MD ED    Advance Directive Documentation        Most Recent Value   Type of Advance Directive  Healthcare Power of [place], Living will   Pre-existing out of facility DNR order (yellow form or pink MOST form)     "MOST" Form in Place?       Disposition Plan: Patient made comfort care at this point  Consultants:  Surgery  Infectious disease  Pulmonary  Procedures:  Thoracentesis  Chest tube  Time spent: 20 minutes  Loletha Grayer  [facility]

## 2015-06-07 DEATH — deceased

## 2015-06-12 LAB — PH, BODY FLUID: pH, Body Fluid: 7.8

## 2015-06-21 ENCOUNTER — Inpatient Hospital Stay: Payer: PRIVATE HEALTH INSURANCE

## 2015-07-19 ENCOUNTER — Ambulatory Visit: Payer: PRIVATE HEALTH INSURANCE | Admitting: Internal Medicine

## 2015-07-19 ENCOUNTER — Other Ambulatory Visit: Payer: PRIVATE HEALTH INSURANCE

## 2015-07-26 ENCOUNTER — Other Ambulatory Visit: Payer: PRIVATE HEALTH INSURANCE

## 2015-07-26 ENCOUNTER — Ambulatory Visit: Payer: PRIVATE HEALTH INSURANCE | Admitting: Internal Medicine

## 2016-07-27 IMAGING — CT CT CHEST W/O CM
1 series · 16 of 29 positions shown, 20 images · non-contrast
Comparison: Chest CT 03/24/2015

CLINICAL DATA: Cough and short of breath.  History of CML.

EXAM:
CT CHEST WITHOUT CONTRAST
TECHNIQUE: Multidetector CT imaging of the chest was performed following the
standard protocol without IV contrast.

[Series 2: routine chest wo · axial · 0.69mm/px · z∈[-338,-108]mm · 16 of 50 slices shown, 20 images]
[im 2/50  mediastinal]
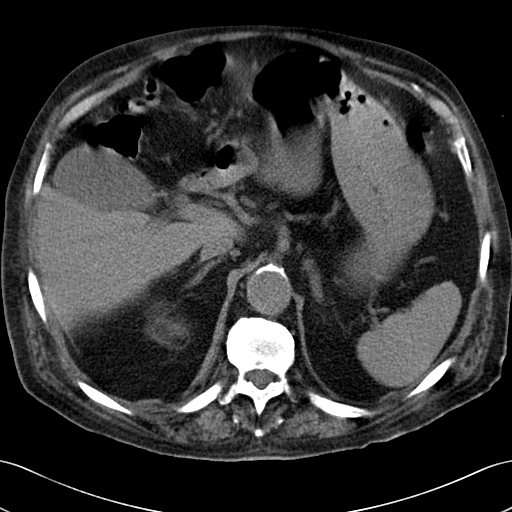
[im 2/50  lung]
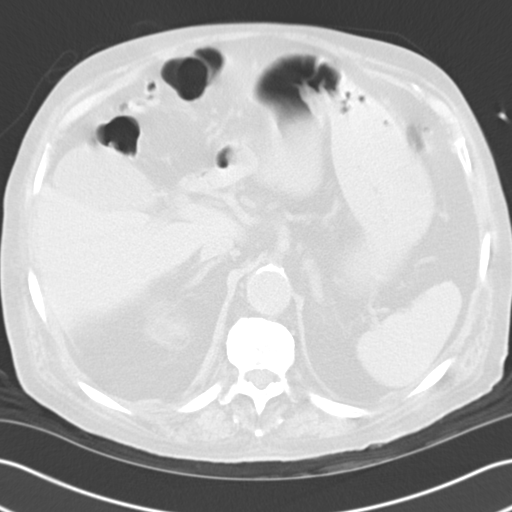
[im 6/50  lung]
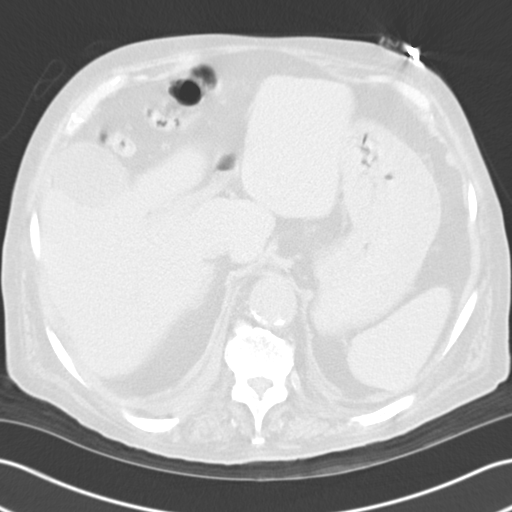
[im 10/50  lung]
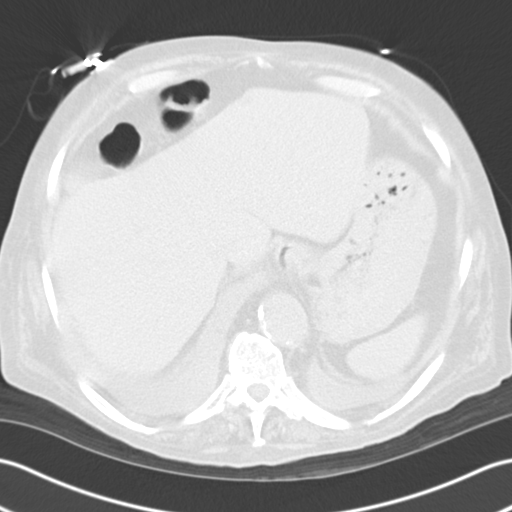
[im 11/50  lung]
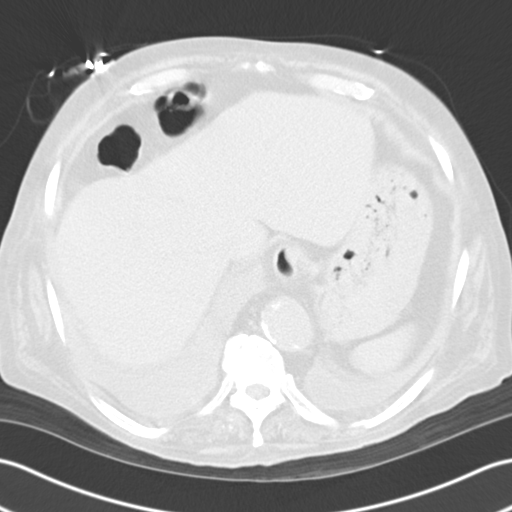
[im 15/50  mediastinal]
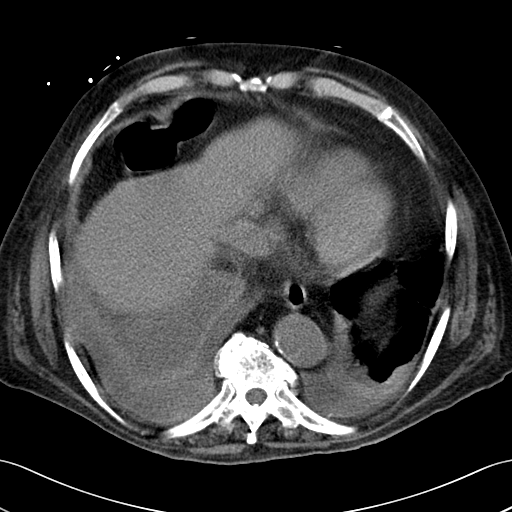
[im 15/50  lung]
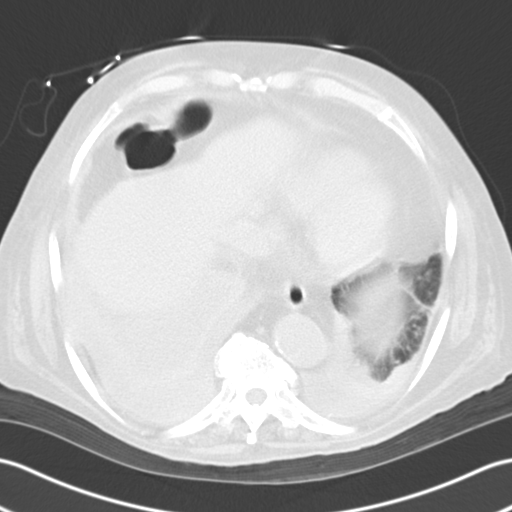
[im 19/50  lung]
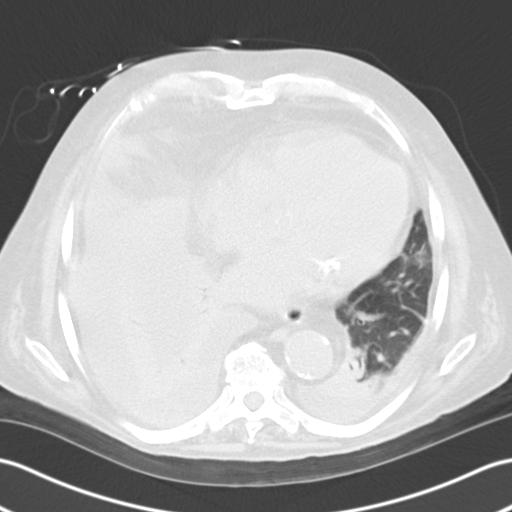
[im 20/50  lung]
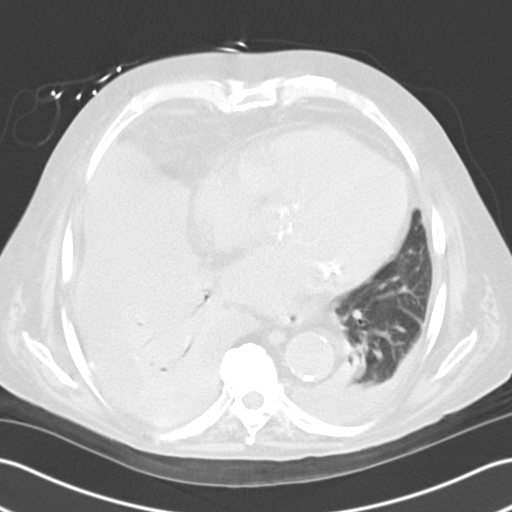
[im 24/50  lung]
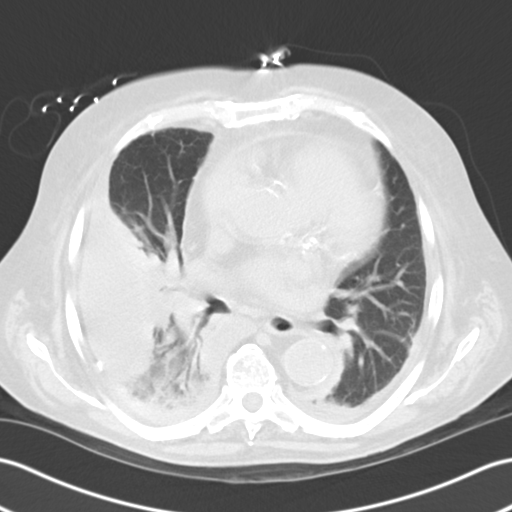
[im 27/50  mediastinal]
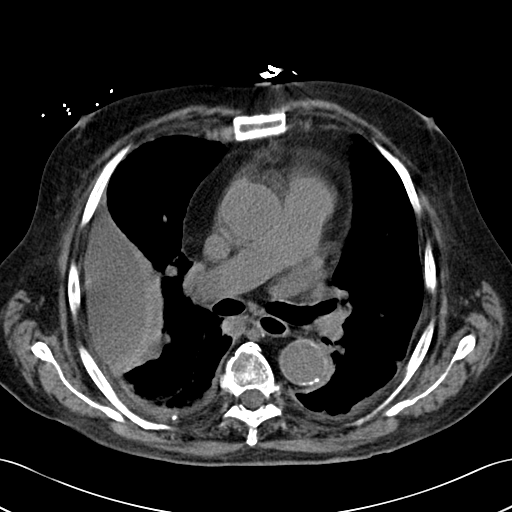
[im 27/50  lung]
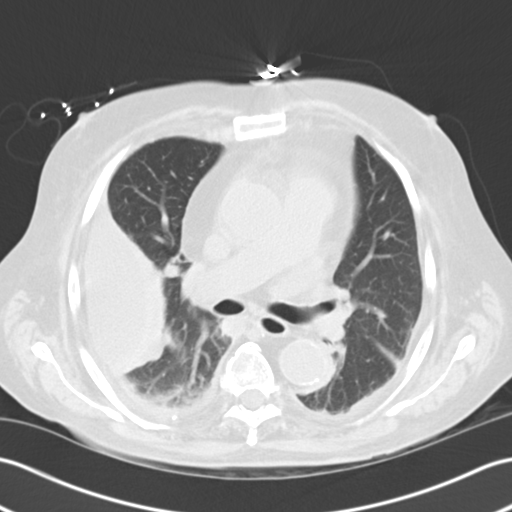
[im 30/50  lung]
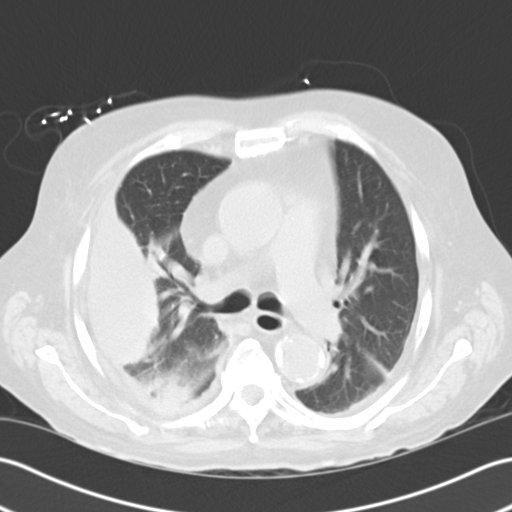
[im 31/50  lung]
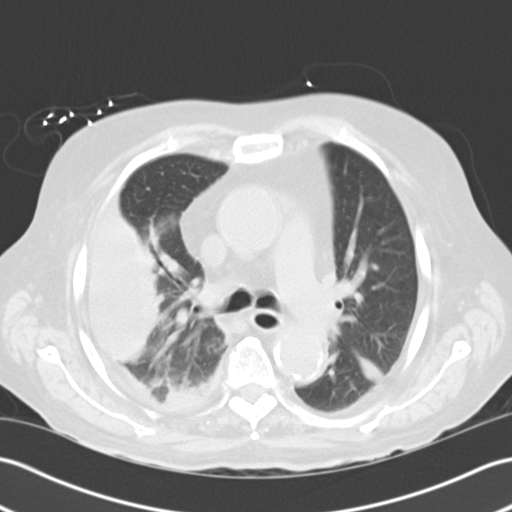
[im 35/50  lung]
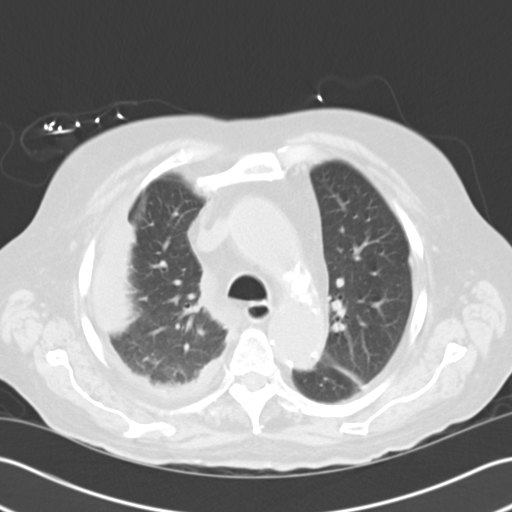
[im 39/50  mediastinal]
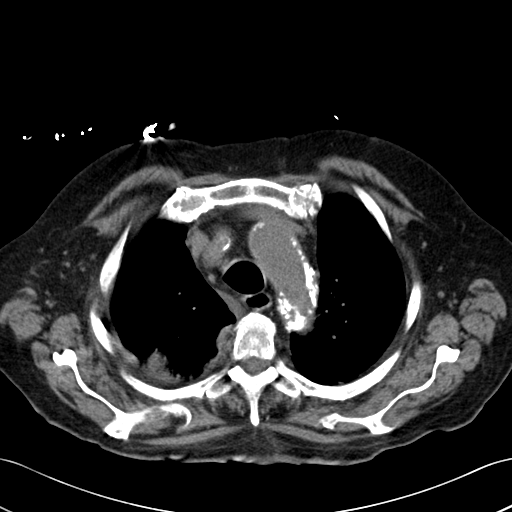
[im 39/50  lung]
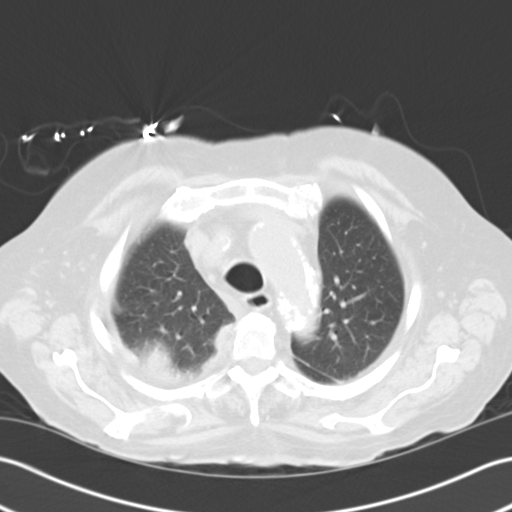
[im 40/50  lung]
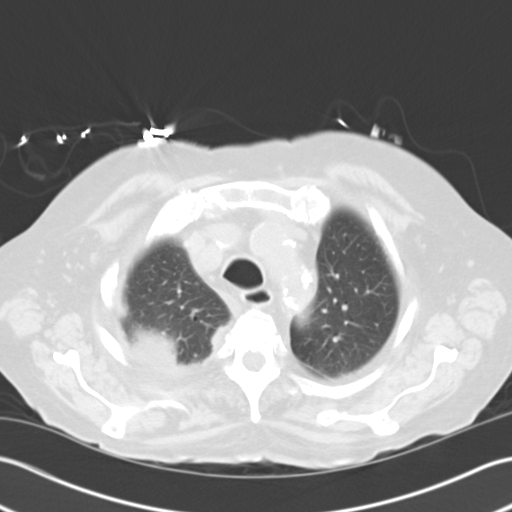
[im 44/50  lung]
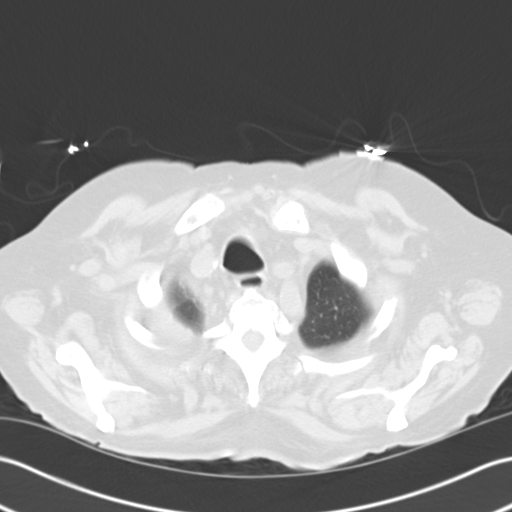
[im 48/50  lung]
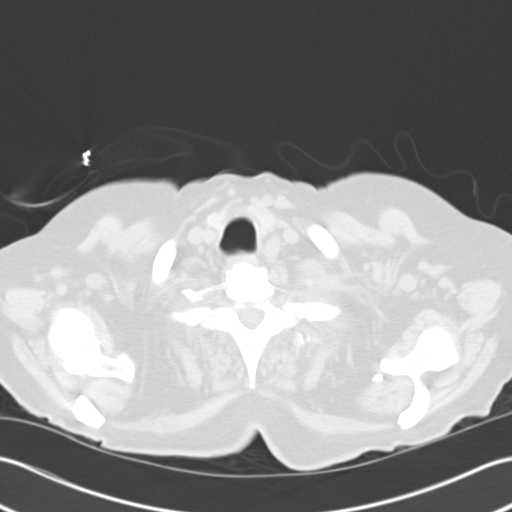

[16 of 29 positions shown; findings below may reference images not displayed]

FINDINGS: Mediastinum/Nodes: No axillary or supraclavicular lymphadenopathy.
No mediastinal or hilar adenopathy. No pericardial fluid. Esophagus
normal.

Lungs/Pleura: Is new loculated pleural effusion along the RIGHT hemi
thorax. Consolidated lung within the RIGHT lower lobe with air
bronchograms (image 29, series 3.

Small LEFT effusion with basilar atelectasis.

Upper abdomen: Limited view of the liver, kidneys, pancreas are
unremarkable. Normal adrenal glands.

Musculoskeletal: No acute osseous abnormality.
IMPRESSION: 1. Consolidation in the RIGHT lower lobe with air bronchograms is
concerning for pneumonia. Atelectasis with air bronchograms could
have a similar pattern.
2. New Loculated pleural fluid along the RIGHT hemi thorax.
3. Small LEFT effusion.
4. No mediastinal adenopathy
